# Patient Record
Sex: Female | Born: 1950 | Race: Black or African American | Hispanic: No | Marital: Single | Smoking: Current every day smoker
Health system: Southern US, Community
[De-identification: ages and names within clinical notes are randomized; demographics above are authoritative.]

## PROBLEM LIST (undated history)

## (undated) DIAGNOSIS — Z87898 Personal history of other specified conditions: Secondary | ICD-10-CM

## (undated) DIAGNOSIS — J449 Chronic obstructive pulmonary disease, unspecified: Secondary | ICD-10-CM

## (undated) DIAGNOSIS — R112 Nausea with vomiting, unspecified: Secondary | ICD-10-CM

## (undated) DIAGNOSIS — T8859XA Other complications of anesthesia, initial encounter: Secondary | ICD-10-CM

## (undated) DIAGNOSIS — F419 Anxiety disorder, unspecified: Secondary | ICD-10-CM

## (undated) DIAGNOSIS — Z9889 Other specified postprocedural states: Secondary | ICD-10-CM

## (undated) DIAGNOSIS — M1990 Unspecified osteoarthritis, unspecified site: Secondary | ICD-10-CM

## (undated) DIAGNOSIS — J9610 Chronic respiratory failure, unspecified whether with hypoxia or hypercapnia: Secondary | ICD-10-CM

## (undated) DIAGNOSIS — I2510 Atherosclerotic heart disease of native coronary artery without angina pectoris: Secondary | ICD-10-CM

## (undated) DIAGNOSIS — I639 Cerebral infarction, unspecified: Secondary | ICD-10-CM

## (undated) DIAGNOSIS — I10 Essential (primary) hypertension: Secondary | ICD-10-CM

## (undated) DIAGNOSIS — M797 Fibromyalgia: Secondary | ICD-10-CM

## (undated) DIAGNOSIS — E119 Type 2 diabetes mellitus without complications: Secondary | ICD-10-CM

## (undated) DIAGNOSIS — K219 Gastro-esophageal reflux disease without esophagitis: Secondary | ICD-10-CM

## (undated) DIAGNOSIS — F319 Bipolar disorder, unspecified: Secondary | ICD-10-CM

## (undated) DIAGNOSIS — F32A Depression, unspecified: Secondary | ICD-10-CM

## (undated) DIAGNOSIS — M353 Polymyalgia rheumatica: Secondary | ICD-10-CM

## (undated) HISTORY — DX: Gastro-esophageal reflux disease without esophagitis: K21.9

## (undated) HISTORY — DX: Bipolar disorder, unspecified: F31.9

## (undated) HISTORY — DX: Type 2 diabetes mellitus without complications: E11.9

## (undated) HISTORY — PX: SHOULDER SURGERY: SHX246

## (undated) HISTORY — DX: Polymyalgia rheumatica: M35.3

## (undated) HISTORY — DX: Chronic respiratory failure, unspecified whether with hypoxia or hypercapnia: J96.10

## (undated) HISTORY — PX: BACK SURGERY: SHX140

## (undated) HISTORY — PX: ABDOMINAL HYSTERECTOMY: SHX81

## (undated) HISTORY — DX: Essential (primary) hypertension: I10

## (undated) HISTORY — DX: Anxiety disorder, unspecified: F41.9

## (undated) HISTORY — DX: Atherosclerotic heart disease of native coronary artery without angina pectoris: I25.10

## (undated) HISTORY — DX: Chronic obstructive pulmonary disease, unspecified: J44.9

---

## 2003-01-26 ENCOUNTER — Ambulatory Visit (HOSPITAL_COMMUNITY): Admission: RE | Admit: 2003-01-26 | Discharge: 2003-01-26 | Payer: Self-pay | Admitting: Family Medicine

## 2003-01-26 ENCOUNTER — Encounter: Payer: Self-pay | Admitting: Family Medicine

## 2003-02-01 ENCOUNTER — Encounter: Admission: RE | Admit: 2003-02-01 | Discharge: 2003-03-04 | Payer: Self-pay | Admitting: Family Medicine

## 2003-03-04 ENCOUNTER — Emergency Department (HOSPITAL_COMMUNITY): Admission: EM | Admit: 2003-03-04 | Discharge: 2003-03-04 | Payer: Self-pay | Admitting: Emergency Medicine

## 2004-05-16 ENCOUNTER — Ambulatory Visit: Payer: Self-pay | Admitting: Family Medicine

## 2004-06-14 ENCOUNTER — Ambulatory Visit: Payer: Self-pay | Admitting: Family Medicine

## 2004-08-28 ENCOUNTER — Ambulatory Visit: Payer: Self-pay | Admitting: Cardiology

## 2004-09-10 ENCOUNTER — Ambulatory Visit: Payer: Self-pay | Admitting: Family Medicine

## 2004-09-19 ENCOUNTER — Ambulatory Visit: Payer: Self-pay | Admitting: Family Medicine

## 2004-10-10 ENCOUNTER — Ambulatory Visit: Payer: Self-pay | Admitting: Family Medicine

## 2004-11-15 ENCOUNTER — Inpatient Hospital Stay (HOSPITAL_COMMUNITY): Admission: RE | Admit: 2004-11-15 | Discharge: 2004-11-17 | Payer: Self-pay | Admitting: Specialist

## 2005-01-03 ENCOUNTER — Ambulatory Visit: Payer: Self-pay | Admitting: Family Medicine

## 2005-01-14 ENCOUNTER — Ambulatory Visit: Payer: Self-pay | Admitting: Family Medicine

## 2005-02-12 ENCOUNTER — Ambulatory Visit: Payer: Self-pay | Admitting: Family Medicine

## 2005-02-19 ENCOUNTER — Encounter: Admission: RE | Admit: 2005-02-19 | Discharge: 2005-03-26 | Payer: Self-pay | Admitting: Specialist

## 2005-04-17 ENCOUNTER — Ambulatory Visit: Payer: Self-pay | Admitting: Family Medicine

## 2005-05-21 ENCOUNTER — Ambulatory Visit: Payer: Self-pay | Admitting: Family Medicine

## 2005-05-28 ENCOUNTER — Encounter: Admission: RE | Admit: 2005-05-28 | Discharge: 2005-08-26 | Payer: Self-pay | Admitting: Family Medicine

## 2005-07-18 ENCOUNTER — Ambulatory Visit: Payer: Self-pay | Admitting: Family Medicine

## 2005-08-19 ENCOUNTER — Ambulatory Visit: Payer: Self-pay | Admitting: Family Medicine

## 2005-09-23 ENCOUNTER — Ambulatory Visit: Payer: Self-pay | Admitting: Family Medicine

## 2005-11-20 ENCOUNTER — Ambulatory Visit: Payer: Self-pay | Admitting: Family Medicine

## 2006-01-01 ENCOUNTER — Ambulatory Visit: Payer: Self-pay | Admitting: Family Medicine

## 2006-01-23 ENCOUNTER — Ambulatory Visit: Payer: Self-pay | Admitting: Family Medicine

## 2006-02-19 ENCOUNTER — Ambulatory Visit: Payer: Self-pay | Admitting: Family Medicine

## 2006-03-27 ENCOUNTER — Ambulatory Visit: Payer: Self-pay | Admitting: Family Medicine

## 2006-04-30 ENCOUNTER — Ambulatory Visit: Payer: Self-pay | Admitting: Family Medicine

## 2006-05-20 ENCOUNTER — Ambulatory Visit: Payer: Self-pay | Admitting: Physician Assistant

## 2006-06-18 ENCOUNTER — Ambulatory Visit: Payer: Self-pay | Admitting: Family Medicine

## 2006-07-11 ENCOUNTER — Ambulatory Visit: Payer: Self-pay | Admitting: Family Medicine

## 2006-08-11 ENCOUNTER — Ambulatory Visit: Payer: Self-pay | Admitting: Family Medicine

## 2006-09-02 ENCOUNTER — Ambulatory Visit: Payer: Self-pay | Admitting: Family Medicine

## 2006-10-13 ENCOUNTER — Ambulatory Visit: Payer: Self-pay | Admitting: Family Medicine

## 2010-07-15 HISTORY — PX: ANTERIOR FUSION CERVICAL SPINE: SUR626

## 2010-08-04 ENCOUNTER — Encounter: Payer: Self-pay | Admitting: Specialist

## 2010-08-05 ENCOUNTER — Encounter: Payer: Self-pay | Admitting: Specialist

## 2010-11-30 NOTE — Op Note (Signed)
NAMEJAYANA, KOTULA                 ACCOUNT NO.:  [id number]   MEDICAL RECORD NO.:  [id number]          PATIENT TYPE:  INP   LOCATION:  5007                         FACILITY:  [facility]   PHYSICIAN:  Kerrin Champagne, M.D.   DATE OF BIRTH:  [birth year]   DATE OF PROCEDURE:  11/16/2004  DATE OF DISCHARGE:                                 OPERATIVE REPORT   PREOPERATIVE DIAGNOSES:  Cervical spinal stenosis with herniated nucleus  pulposus at C4-5; foraminal entrapment bilateral C4-5, bilateral C5-6 and C6-  7.   POSTOPERATIVE DIAGNOSES:  Cervical spinal stenosis with herniated nucleus  pulposus at C4-5; foraminal entrapment bilateral C4-5, bilateral C5-6 and C6-  7.   PROCEDURE:  Anterior cervical diskectomy and fusion -- C4-5, C5-6 and C6-7;  with right iliac crest bone graft harvested through a separate incision.  Internal fixation using a 61 mm length DePuy locking plate with six screws.   SURGEON:  Kerrin Champagne, M.D.   ASSISTANT:  Maud Deed, [facility]   ANESTHESIA:  GOT.   ANESTHESIOLOGIST:  Maren Beach, M.D.   ESTIMATED BLOOD LOSS:  75 cc.   COMPLICATIONS:  None.   BRIEF CLINICAL HISTORY:  The patient is a 60 year old female who reportedly  injured her neck while working in [place]. This occurred while trying  to move a can full of clothing, weighing close to 100 pounds. She has had  pain and discomfort ever since; radiation and  burning pain in the left  fourth and fifth digits and into the left thumb. She underwent attempts at  conservative management, which were unsuccessful, including physical  therapy, the use of anti-inflammatory agents and medications for discomfort.  She, however, has not shown any significant improvement. She has significant  radiographic findings of narrowing of disk spaces at C5-6 and C6-7, some  spurring anteriorly at C4-5 level. MRI study demonstrates severe stenosis of  C5-6 and C6-7, foraminal entrapment bilaterally at C5-6 and C6-7,  there is a  HNP centrally at the C4-5 level. The patient has persisted with her pain,  discomfort and radiation into her shoulders.  On the basis of what is felt  to be cervical stenosis; EMGs, nerve conduction studies returned showing  left-sided C6 and C7 changes.   The patient is brought to the operating room to undergo anterior diskectomy  and fusion at C4-5, C5-6 and C6-7, for cervical stenosis and foraminal  entrapment causing left-sided C6 and C7 nerve root compression.   INTRAOPERATIVE FINDINGS:  The patient was found to have central stenosis at  every segment of C4-5, C5-6 and C6-7. She is found to have bilateral  foraminal entrapment at each segment. Some amount of disk material was found  to be present exiting into the left C6 and C7 neural foramen. Centrally some  small amount of disk material at the C4-5 level, but primarily spondylosis  and calcification of the posterior lip of C4-5 disk space.   DESCRIPTION OF PROCEDURE:  After adequate general anesthesia the patient  placed in a beach-chair position with the neck in slight extension, a well-  padded [place]  horseshoe at five pounds cervical halter traction. The  shoulders and arms at the sides were well-padded with skids in place. TED  hose for both lower extremities to prevent DVT. Foley catheter was placed in  the beginning of the procedure. A bump under the right buttock to allow for  easier exposure of the right anterior iliac crest. Standard preoperative  antibiotics of Ancef. A standard prep with DuraPrep solution over the  anterior neck and right iliac crest; draped in the usual manner with iodine  VyDrape was used. Initial incision was over a left cervical anterior  exposure, and the incision made along the anterior border of the  sternocleidomastoid approximately 6-7 cm in length; through the skin and  subcutaneous layers and central portion.  The incision based at the expected  C5 level. Then the skin and  subcutaneous layers down to the platysma layer;  this was incised and the medial border of the sternocleidomastoid  identified. Carefully the soft tissues developed both proximally and  distally, large external jugular vein was identified and t is was suture  ligated and divided in the proximal portion of the incision. The omohyoid  muscle identified and this was then carefully freed up and divided.   The interval between the trachea and esophagus medially and carotid sheath  laterally then developed, using Metzenbaum scissors down to the anterior  aspect of the cervical spine.   Here the inferior thyroid artery and vein were identified. These were  carefully freed up and then suture ligated with 2-0 Vicryl sutures; to allow  for mobilization of the trachea and esophagus. Prevertebral fascia was then  carefully cauterized with bipolar electrocautery along the medial border of  the longus colli muscle, then freed up across the midline using a Forensic scientist.  Large spurs at the expected C6-7 level were identified; those at  the C5-6 level and C4-5 identified. Spinal needles with sheath in place,  only allowing a centimeter of the needle to extend beyond the tip, were then  inserted into the disk space at C5-6 and C4-5. Intraoperative lateral  radiograph demonstrated the needles at the said levels.  These noted, then  carefully under handheld Cloward retraction, the needles were removed and a  small portion the anterior aspect of disk was excised using a 15 blade  scalpel and pituitary rongeurs at  the C4-5 and C5-6 levels.   Medial border of longus colli muscle then carefully freed up from C4 to C7  bilaterally, using electrocautery along with Key elevator. A Voss-McCullough  retractor inserted, articulated to allow for its placement in the neck.  The foot of the retractor beneath the medial border longus colli muscle placed  at the C6-7 level initially. Osteophytes of the anterior  aspect of the disk  resected using [facility] rongeur.   Then 3 mm-2 mm Kerrison used to additionally resect the anterior lip  osteophytes of the C6-7 level.  A 14 mm screw post placed in the vertebral  body of C6 and C7. Distraction obtained across the disk space. Further  debridement of this then carried out, removing degenerated disk as well as  cartilaginous endplates, using a Ephys 2-0 microcurette as well as pituitary  rongeurs back to the posterior aspect of the disk. High-speed bur was then  used to carefully remove bony areas over the anterior aspect of disk space,  and posteriorly over the posterior endplate it was noted to be concave.  This was resected back to allow [place] to remove  the posterior lip osteophyte  present. The operating room microscope draped and  brought into the field  sterilely, and under the microscope then a careful excision of the posterior  lip osteophytes was carried out.  We first used high-speed bur to thin down  the vertebral joints laterally, using 1 mm Kerrison to perform foraminotomy,  and then being able to negotiate the posterior aspect of the vertebral body,  posterior-inferior aspect of C6, and then resect the osteophyte cranially to  its largest projection. This was then removed off of the cord and the  posterior longitudinal ligament using a micro pituitary rongeur. This was  carried out over the inferior endplate of  C6-7 as well.  The disk material  removed and posterior annulus removed at C6-7 level. Where possible, the  posterior longitudinal ligament was left in place.   Foraminotomy performed over both the right C7 and left C7 nerve roots; such  that the nerve roots were observed to be exiting and in a more anterolateral  position from its exit off the cord at both levels --  indicating that it  had been well decompressed. Height of the intervertebral disk space measured  using a #8 sounder off the DePuy set; this measured 9 mm and a 9 mm dual   oscillating saw was used to form-cut into the right iliac crest.  The right  iliac crest was initially exposed while the radiograph, and  the lateral  cervical spine localization view was being done using a 10 blade scalpel  after infiltration of the skin with Marcaine 0.5% with 200,000 epinephrine.  Incision carried down sharply to the right anterior lateral iliac crest  approximately 2-1/2 inches from the anterosuperior iliac spine.  Incision  made through skin and subcutaneous layers directly down to the crest with  electrocautery to control bleeders; subperiosteal dissection of the medial  and lateral to protect the crest, with retractors on either side of the  crest.  Dual oscillating saw at 9 mm used to cut the crest, and the base of  the cut then completed using a 1/4 inch osteotome. This was then carefully tapered to the dimensions of intervertebral disk space; depth of the  intervertebral disk space measured using a Cloward depth gauge, and about 20  mm  a graft width of about 14 mm in depth of approximately 17 mm was used.  A height of 9 mm was carefully trimmed with the dimensions of the  intervertebral disk space, carefully keyed. High-speed bur was then used to  carefully remove further cartilaginous endplates down to bleeding endplate  bone. Graft then placed over the intervertebral disk space following  irrigation, and ensuring that there was no soft tissue remaining within the  disk space.  The graft was then impacted into place and subset 1-2 mm.  With  the graft in place, then a high-speed bur was then used to carefully trim  further the anterior lip osteophytes; carefully smooth this area. The screw  posts of the C7 level was then removed. Bone wax applied to bleeding screw  post hole for hemostasis. Self-retaining retractors were then removed from  the incision, then placed at the next segment at C5-6. Put the retractor  beneath the medial border of the  longus colli  muscle. Screw post of 14 mm  then inserted into the vertebral body of C5 and distraction obtained across  the C5-6 disk space. Anterior lip osteophytes removed using 2-3 mm  Kerrison's and pituitary, as well  as microcurets used to debride the disk of  the anterior annulus and degenerative disk material.  Cartilaginous  endplates were resected both from the inferior aspect of C5 and superior  aspect of C6, back to the posterior aspect of the disk space. The operating  room microscope brought into the field, and under the operating microscope  then the high-speed bur (3 mm) was used to carefully trim the cartilaginous  endplates back to bleeding bone endplates.  We carefully thinned the  uncovertebral regions bilaterally to an area where entry could be made with  1 mm and 2 mm  Kerrison's, performing foraminotomies over the C6 nerve roots  bilaterally and resecting posterior lip osteophytes cranial to the largest  projection -- both from the superior aspect of posterior disk space and then  over the inferior aspect. This decompressed the central portion of the  spinal canal quite nicely at both foramen. Height of the intervertebral disk  space measured using a 7 mm sounder, and a height of 8 mm was chosen.   Turning to the right iliac crest, an 8 mm dual oscillating saw was then used  to further cut crest. This was then carefully tapered to the dimensions of  intervertebral disk space depth at this level. An 18 mm with 15 mm graft  depth was chosen, with height of 8 mm.  This was carefully tapered to the  dimensions of the intervertebral disk space and keyed. Then impacted into  place, ensuring no soft tissue remained within the disk space could be  retropulsed with its insertion. Graft was keyed and subset an additional 1-2  mm for stability. Screw posts then removed at the C6 level. Bone wax applied to bleeding screw post hole. Carefully, the self-retaining retractor was  removed and  replaced at the C4-5 level. Distraction obtained with a Boss  retractor blade beneath the medial border of the longus colli muscle on both  sides -- exposing the C4-5 level.  Screw post was then inserted into the  vertebral body anteriorly of C4. Distraction obtained across the disk space  of the C4-5 level. Anterior lip osteophytes removed using a high-speed bur  as well as pituitary rongeurs, and 2 mm and 3 mm Kerrison's. Anterior  annular fibers resected with a 15 blade scalpel, as well as Kerrison's and  curets. Cartilaginous endplates resected off the inferior aspect of C4 and  superior aspect of C5 vertebral body; down to the bleeding bony endplates.  This was continued back to the posterior aspect of the disk space. A high-  speed bur was then used to carefully thin the uncovertebral joints  bilaterally. Foraminotomy performed over both neural foramen using 1 mm and  2 mm Kerrison's; then able to access the area superior to the posterior lip  osteophytes, and then resect superior to the most prominent area of the  protracting spurs posteriorly at the C4 level, and then inferior to that at  the C5 level. Spinal canal appeared to be well decompressed within it. There  is some bony material within the posterior longitudinal ligament that was  resected as well centrally. Both the C5 nerve roots noted be exiting quite  well. Note that there was some disk material resected from the left C6-7 and  C5-6 level foramen on the left side, highly suspicious for disk protrusion  of the segments. With resection of this, then a high-speed bur was then used  to carefully perform decortication of the endplates at C4-C5 to bleeding  endplates. The height of the intervertebral disk space measured with a 7 mm  sounder as well; 8 mm dual oscillating saw used cut the crest on the right  side. This was divided across the space again with a 1/4 inch osteotome,  carefully tapering to dimensions of the  intervertebral disk space depth at  this level (again, 18 mm)  A 15 mm depth graft was chosen. The graft was  carefully keyed and impacted into place, after careful inspection of the  disk space demonstrated no soft tissue to be retropulsed with insertion of  the graft. With this then, both screw posts were removed. The graft was  carefully keyed into place; 1 mm  to 2 mm subset. With both screw posts  removed, screw post holes carefully coated with bone wax to obtain  hemostasis.   Anterior aspect of the cervical spine was carefully examined.  High-speed  bur used to trim anterior lip osteophytes of C4-5, C5-6 and C6-7; to  carefully smooth the anterior surface of cervical spine to accept the DePuy  locking plate. Several plates were placed against the anterior cervical  spine; a 61-mm plate provided the best length, as the screw holes proximally or cranially appeared to be just above the disk space at C4-5, with those  inferiorly similarly just below the C6-7 disk space. This would prevent  pressure compression of the adjacent segment and motion segment. This plate  was then carefully aligned at the midline, and then temporarily pinned in  place after release of traction. Five pounds of cervical halter traction was  released, and the pins were then placed to the vertebral body of C4 and at  C7, fixing the plate in place temporarily. The first screws placed were at  the C7 level (15 mm screws) and the 16 mm drill was used, then 16 mm  self-  tapping screws placed both left and right side without difficulty.   Attention then turned to the C6 level, where the distraction pins were then  removed both superiorly and inferiorly.  These were temporary fixation pins.  This will allow for each of the levels to compress down with the internal  fixation.  At the C6 level, A 15 mm screw was used and this was used on both  sides, first drilling with a 16 mm drill and placing the 15 mm screws at  C6.  At C5,  15 mm screws were placed as well as C4. These provided excellent  fixation at each segment and good capture of  bone stalk. The plate appeared  to kneel to the anterior cervical spine quite nicely. Each of the locking  screws were then turned, locking the plate to the screws at each segment  without difficulty. Intraoperative radiograph did not demonstrate the lower  screws, so the C-arm fluoroscopy  was brought into the field.  Under C-arm  fluoroscopy,  AP view demonstrated the plate in excellent position and  alignment in AP plane, as well as in lateral planes.  Screws showing no  evidence of retropulsion.  The bone graft also showing no sign of  retropulsion. Soft tissue structures appeared normal. Irrigation was  performed of the soft tissues.  The C-arm was removed. Following irrigation,  a 10-French TLS  drain was placed in the cervical spine exiting anteriorly.  The prevertebral fascial layers were allowed to fall softly into place, as  was the esophagus and trachea.  Careful inspection of the esophagus  demonstrated no abnormalities. The  patient had reapproximation of platysma  layer with interrupted 2-0 Vicryl sutures; deep subcutaneous layers with  interrupted 2-0 and 3-0 Vicryl sutures. The subcuticular stitch was then  placed using 4-0 running stitch to close the skin. Tincture of benzoin and  Steri-Strips applied. The drain was sewn in place with 4-0 nylon stitch and  then charged to a red top tube. Right iliac crest bone graft harvest site  was carefully hemostased using bone wax and thrombin-soaked Gelfoam.  Periosteum carefully approximated one to the other with #1 Vicryl sutures.  The deep subcutaneous  layers approximated with interrupted #1 and 0 Vicryl  sutures. The skin closed following, closing the more superficial layers with  interrupted 2-0 Vicryl sutures. Skin was closed with 4-0 Vicryl running subcuticular stitch. Tincture of benzoin and Steri-Strips  applied; 4x4s  affixed to the skin with Hypafix tape.  The left neck 4x4s affixed to the  skin with Hypafix tape.  [place] collar was applied. The patient was  then returned to her bed; reactivated, extubated and returned to the  recovery room in satisfactory condition. All instrument and sponge counts  were correct.      JEN/MEDQ  D:  11/16/2004  T:  11/16/2004  Job:  [id number]

## 2010-11-30 NOTE — Procedures (Signed)
   NAME:  Candace Harris, Candace Harris                           ACCOUNT NO.:  [id number]   MEDICAL RECORD NO.:  [id number]                   PATIENT TYPE:  OUT   LOCATION:  RAD                                  FACILITY:  [facility]   PHYSICIAN:  Thomas C. Wall, M.D.                DATE OF BIRTH:  26-Sep-1950   DATE OF PROCEDURE:  DATE OF DISCHARGE:                                  ECHOCARDIOGRAM   INDICATIONS FOR PROCEDURE:  Stroke (434.91).   The echocardiogram was technically adequate.   CONCLUSION:  1. Normal left and right ventricular size.  2. Normal left ventricular chamber size and overall systolic function.     There is no segmental wall motion abnormalities and no obvious mass or     thrombus.  3. Mild mitral regurgitation.  4. Normal aortic valve.  5. Normal right-sided structures and function.   There is no obvious cardiogenic source of embolus noted on this study.                                                Thomas C. Wall, M.D.    TCW/MEDQ  D:  01/26/2003  T:  01/27/2003  Job:  [id number]   cc:   Delaney Meigs, M.D.  [address]  [place]  [place] [address]  Fax: [phone]

## 2015-01-06 DIAGNOSIS — F411 Generalized anxiety disorder: Secondary | ICD-10-CM | POA: Insufficient documentation

## 2015-01-06 DIAGNOSIS — M545 Low back pain, unspecified: Secondary | ICD-10-CM | POA: Insufficient documentation

## 2015-01-06 DIAGNOSIS — F324 Major depressive disorder, single episode, in partial remission: Secondary | ICD-10-CM | POA: Insufficient documentation

## 2015-11-06 DIAGNOSIS — F4310 Post-traumatic stress disorder, unspecified: Secondary | ICD-10-CM | POA: Insufficient documentation

## 2016-03-08 DIAGNOSIS — E538 Deficiency of other specified B group vitamins: Secondary | ICD-10-CM | POA: Insufficient documentation

## 2016-03-26 DIAGNOSIS — D510 Vitamin B12 deficiency anemia due to intrinsic factor deficiency: Secondary | ICD-10-CM | POA: Insufficient documentation

## 2016-04-01 DIAGNOSIS — M1991 Primary osteoarthritis, unspecified site: Secondary | ICD-10-CM | POA: Insufficient documentation

## 2017-02-13 DIAGNOSIS — D509 Iron deficiency anemia, unspecified: Secondary | ICD-10-CM | POA: Insufficient documentation

## 2017-03-20 DIAGNOSIS — M1990 Unspecified osteoarthritis, unspecified site: Secondary | ICD-10-CM | POA: Insufficient documentation

## 2017-06-03 DIAGNOSIS — F17210 Nicotine dependence, cigarettes, uncomplicated: Secondary | ICD-10-CM | POA: Insufficient documentation

## 2017-06-03 DIAGNOSIS — F1190 Opioid use, unspecified, uncomplicated: Secondary | ICD-10-CM | POA: Insufficient documentation

## 2018-05-24 DIAGNOSIS — M797 Fibromyalgia: Secondary | ICD-10-CM | POA: Insufficient documentation

## 2018-08-13 ENCOUNTER — Other Ambulatory Visit (HOSPITAL_COMMUNITY): Payer: Self-pay | Admitting: Respiratory Therapy

## 2018-08-13 DIAGNOSIS — J441 Chronic obstructive pulmonary disease with (acute) exacerbation: Secondary | ICD-10-CM

## 2018-09-09 ENCOUNTER — Inpatient Hospital Stay (HOSPITAL_COMMUNITY): Admission: RE | Admit: 2018-09-09 | Payer: Self-pay | Source: Ambulatory Visit

## 2019-02-17 ENCOUNTER — Inpatient Hospital Stay (HOSPITAL_COMMUNITY): Admission: RE | Admit: 2019-02-17 | Payer: Medicare HMO | Source: Ambulatory Visit

## 2019-02-22 ENCOUNTER — Other Ambulatory Visit: Payer: Self-pay

## 2019-02-22 ENCOUNTER — Other Ambulatory Visit (HOSPITAL_COMMUNITY)
Admission: RE | Admit: 2019-02-22 | Discharge: 2019-02-22 | Disposition: A | Discharge status: Home / Self Care | Payer: Medicare HMO | Source: Ambulatory Visit | Attending: Pulmonary Disease | Admitting: Pulmonary Disease

## 2019-02-24 ENCOUNTER — Ambulatory Visit (HOSPITAL_COMMUNITY): Admission: RE | Admit: 2019-02-24 | Payer: Medicare HMO | Source: Ambulatory Visit

## 2019-03-09 ENCOUNTER — Other Ambulatory Visit (HOSPITAL_COMMUNITY): Admission: RE | Admit: 2019-03-09 | Payer: Medicare HMO | Source: Ambulatory Visit

## 2019-03-09 ENCOUNTER — Other Ambulatory Visit (HOSPITAL_COMMUNITY)
Admission: RE | Admit: 2019-03-09 | Discharge: 2019-03-09 | Disposition: A | Discharge status: Home / Self Care | Payer: Medicare HMO | Source: Ambulatory Visit | Attending: Pulmonary Disease | Admitting: Pulmonary Disease

## 2019-03-09 ENCOUNTER — Other Ambulatory Visit: Payer: Self-pay

## 2019-03-09 DIAGNOSIS — Z20828 Contact with and (suspected) exposure to other viral communicable diseases: Secondary | ICD-10-CM | POA: Diagnosis not present

## 2019-03-09 DIAGNOSIS — Z01812 Encounter for preprocedural laboratory examination: Secondary | ICD-10-CM | POA: Insufficient documentation

## 2019-03-09 LAB — SARS CORONAVIRUS 2 (TAT 6-24 HRS): SARS Coronavirus 2: NEGATIVE

## 2019-03-10 ENCOUNTER — Other Ambulatory Visit: Payer: Self-pay

## 2019-03-10 ENCOUNTER — Ambulatory Visit (HOSPITAL_COMMUNITY)
Admission: RE | Admit: 2019-03-10 | Discharge: 2019-03-10 | Disposition: A | Discharge status: Home / Self Care | Payer: Medicare HMO | Source: Ambulatory Visit | Attending: Pulmonary Disease | Admitting: Pulmonary Disease

## 2019-03-10 DIAGNOSIS — J441 Chronic obstructive pulmonary disease with (acute) exacerbation: Secondary | ICD-10-CM | POA: Insufficient documentation

## 2019-03-10 LAB — PULMONARY FUNCTION TEST
DL/VA % pred: 36 %
DL/VA: 1.55 ml/min/mmHg/L
DLCO unc % pred: 31 %
DLCO unc: 5.5 ml/min/mmHg
FEF 25-75 Post: 0.7 L/sec
FEF 25-75 Pre: 0.77 L/sec
FEF2575-%Change-Post: -8 %
FEF2575-%Pred-Post: 43 %
FEF2575-%Pred-Pre: 48 %
FEV1-%Change-Post: -2 %
FEV1-%Pred-Post: 83 %
FEV1-%Pred-Pre: 85 %
FEV1-Post: 1.39 L
FEV1-Pre: 1.42 L
FEV1FVC-%Change-Post: 3 %
FEV1FVC-%Pred-Pre: 86 %
FEV6-%Change-Post: -5 %
FEV6-%Pred-Post: 96 %
FEV6-%Pred-Pre: 102 %
FEV6-Post: 1.98 L
FEV6-Pre: 2.1 L
FEV6FVC-%Change-Post: 0 %
FEV6FVC-%Pred-Post: 103 %
FEV6FVC-%Pred-Pre: 104 %
FVC-%Change-Post: -5 %
FVC-%Pred-Post: 93 %
FVC-%Pred-Pre: 98 %
FVC-Post: 1.99 L
FVC-Pre: 2.11 L
Post FEV1/FVC ratio: 70 %
Post FEV6/FVC ratio: 100 %
Pre FEV1/FVC ratio: 67 %
Pre FEV6/FVC Ratio: 100 %

## 2019-03-10 MED ORDER — ALBUTEROL SULFATE (2.5 MG/3ML) 0.083% IN NEBU
2.5000 mg | INHALATION_SOLUTION | Freq: Once | RESPIRATORY_TRACT | Status: AC
  Administered 2019-03-10: 2.5 mg via RESPIRATORY_TRACT

## 2019-09-14 DIAGNOSIS — J449 Chronic obstructive pulmonary disease, unspecified: Secondary | ICD-10-CM | POA: Diagnosis not present

## 2019-09-14 DIAGNOSIS — M5136 Other intervertebral disc degeneration, lumbar region: Secondary | ICD-10-CM | POA: Diagnosis not present

## 2019-09-14 DIAGNOSIS — G894 Chronic pain syndrome: Secondary | ICD-10-CM | POA: Diagnosis not present

## 2019-09-14 DIAGNOSIS — Z79899 Other long term (current) drug therapy: Secondary | ICD-10-CM | POA: Diagnosis not present

## 2019-09-14 DIAGNOSIS — E559 Vitamin D deficiency, unspecified: Secondary | ICD-10-CM | POA: Diagnosis not present

## 2019-09-18 DIAGNOSIS — J449 Chronic obstructive pulmonary disease, unspecified: Secondary | ICD-10-CM | POA: Diagnosis not present

## 2019-09-19 DIAGNOSIS — J449 Chronic obstructive pulmonary disease, unspecified: Secondary | ICD-10-CM | POA: Diagnosis not present

## 2019-10-08 ENCOUNTER — Encounter: Payer: Self-pay | Admitting: Critical Care Medicine

## 2019-10-19 DIAGNOSIS — J449 Chronic obstructive pulmonary disease, unspecified: Secondary | ICD-10-CM | POA: Diagnosis not present

## 2019-10-20 DIAGNOSIS — H25812 Combined forms of age-related cataract, left eye: Secondary | ICD-10-CM | POA: Diagnosis not present

## 2019-10-20 DIAGNOSIS — H2511 Age-related nuclear cataract, right eye: Secondary | ICD-10-CM | POA: Diagnosis not present

## 2019-10-20 DIAGNOSIS — J449 Chronic obstructive pulmonary disease, unspecified: Secondary | ICD-10-CM | POA: Diagnosis not present

## 2019-10-25 DIAGNOSIS — M353 Polymyalgia rheumatica: Secondary | ICD-10-CM | POA: Diagnosis not present

## 2019-10-25 DIAGNOSIS — Z79899 Other long term (current) drug therapy: Secondary | ICD-10-CM | POA: Diagnosis not present

## 2019-10-25 DIAGNOSIS — G894 Chronic pain syndrome: Secondary | ICD-10-CM | POA: Diagnosis not present

## 2019-10-25 DIAGNOSIS — F1120 Opioid dependence, uncomplicated: Secondary | ICD-10-CM | POA: Diagnosis not present

## 2019-11-01 ENCOUNTER — Institutional Professional Consult (permissible substitution): Payer: Medicare HMO | Admitting: Critical Care Medicine

## 2019-11-01 ENCOUNTER — Encounter: Payer: Self-pay | Admitting: Critical Care Medicine

## 2019-11-01 NOTE — Progress Notes (Deleted)
Synopsis: Referred in April 2021 for COPD by Veverly Fells, MD  Subjective:   PATIENT ID: Candace Harris GENDER: female DOB: September 17, 1950, MRN: [id number]  No chief complaint on file.   HPI  Former patient of Dr. Juanetta Gosling  COPD-Asmanex, Symbicort Tobacco-  ***ppd x***years Chronic hypoxic respiratory failure on home oxygen  Chronic prednisone for polymyalgia rheumatica  PCP note 02/22/2019 reviewed  Past Medical History:  Diagnosis Date  . Anxiety   . Bipolar affective disorder (HCC)   . CAD (coronary artery disease)   . Chronic respiratory failure (HCC)   . COPD (chronic obstructive pulmonary disease) (HCC)   . DM (diabetes mellitus) (HCC)   . GERD (gastroesophageal reflux disease)   . HTN (hypertension)   . Polymyalgia rheumatica (HCC)      No family history on file.   *** The histories are not reviewed yet. Please review them in the "History" navigator section and refresh this SmartLink.  Social History   Socioeconomic History  . Marital status: Single    Spouse name: Not on file  . Number of children: Not on file  . Years of education: Not on file  . Highest education level: Not on file  Occupational History  . Not on file  Tobacco Use  . Smoking status: Not on file  Substance and Sexual Activity  . Alcohol use: Not on file  . Drug use: Not on file  . Sexual activity: Not on file  Other Topics Concern  . Not on file  Social History Narrative  . Not on file   Social Determinants of Health   Financial Resource Strain:   . Difficulty of Paying Living Expenses:   Food Insecurity:   . Worried About Programme researcher, broadcasting/film/video in the Last Year:   . Barista in the Last Year:   Transportation Needs:   . Freight forwarder (Medical):   Marland Kitchen Lack of Transportation (Non-Medical):   Physical Activity:   . Days of Exercise per Week:   . Minutes of Exercise per Session:   Stress:   . Feeling of Stress :   Social Connections:   . Frequency of  Communication with Friends and Family:   . Frequency of Social Gatherings with Friends and Family:   . Attends Religious Services:   . Active Member of Clubs or Organizations:   . Attends Banker Meetings:   Marland Kitchen Marital Status:   Intimate Partner Violence:   . Fear of Current or Ex-Partner:   . Emotionally Abused:   Marland Kitchen Physically Abused:   . Sexually Abused:      Not on File    There is no immunization history on file for this patient.  No outpatient medications prior to visit.   No facility-administered medications prior to visit.    ROS   Objective:  There were no vitals filed for this visit.   on *** LPM *** RA BMI Readings from Last 3 Encounters:  No data found for BMI   Wt Readings from Last 3 Encounters:  No data found for Wt    Physical Exam   CBC No results found for: WBC, RBC, HGB, HCT, PLT, MCV, MCH, MCHC, RDW, LYMPHSABS, MONOABS, EOSABS, BASOSABS  CHEMISTRY No results for input(s): NA, K, CL, CO2, GLUCOSE, BUN, CREATININE, CALCIUM, MG, PHOS in the last 168 hours. CrCl cannot be calculated (No successful lab value found.).   Chest Imaging- films reviewed: CXR, 2 view 11/16/2004-hyperinflation.  Mild airway thickening.  Pulmonary Functions Testing Results: PFT Results Latest Ref Rng & Units 03/10/2019  FVC-Pre L 2.11  FVC-Predicted Pre % 98  FVC-Post L 1.99  FVC-Predicted Post % 93  Pre FEV1/FVC % % 67  Post FEV1/FCV % % 70  FEV1-Pre L 1.42  FEV1-Predicted Pre % 85  FEV1-Post L 1.39  DLCO UNC% % 31  DLCO COR %Predicted % 36   2020- mild obstruction without significant bronchodilator reversibility. Severe diffusion impairment.  Flow volume loop supports obstruction.      Assessment & Plan:     ICD-10-CM   1. Chronic obstructive pulmonary disease, unspecified COPD type ([place])  J44.9      No current outpatient medications on file.    Julian Hy, DO [facility] 11/01/2019 8:15 AM

## 2019-11-11 DIAGNOSIS — R112 Nausea with vomiting, unspecified: Secondary | ICD-10-CM | POA: Diagnosis not present

## 2019-11-11 DIAGNOSIS — F419 Anxiety disorder, unspecified: Secondary | ICD-10-CM | POA: Diagnosis not present

## 2019-11-18 DIAGNOSIS — J449 Chronic obstructive pulmonary disease, unspecified: Secondary | ICD-10-CM | POA: Diagnosis not present

## 2019-11-19 DIAGNOSIS — J449 Chronic obstructive pulmonary disease, unspecified: Secondary | ICD-10-CM | POA: Diagnosis not present

## 2019-11-23 DIAGNOSIS — H524 Presbyopia: Secondary | ICD-10-CM | POA: Diagnosis not present

## 2019-11-23 DIAGNOSIS — H2522 Age-related cataract, morgagnian type, left eye: Secondary | ICD-10-CM | POA: Diagnosis not present

## 2019-11-24 ENCOUNTER — Ambulatory Visit (INDEPENDENT_AMBULATORY_CARE_PROVIDER_SITE_OTHER): Payer: Medicare HMO | Admitting: Critical Care Medicine

## 2019-11-24 ENCOUNTER — Other Ambulatory Visit: Payer: Self-pay

## 2019-11-24 ENCOUNTER — Encounter: Payer: Self-pay | Admitting: Critical Care Medicine

## 2019-11-24 VITALS — BP 130/70 | HR 104 | Temp 97.0°F | Ht 61.0 in | Wt 134.2 lb

## 2019-11-24 DIAGNOSIS — J9611 Chronic respiratory failure with hypoxia: Secondary | ICD-10-CM

## 2019-11-24 DIAGNOSIS — J439 Emphysema, unspecified: Secondary | ICD-10-CM | POA: Diagnosis not present

## 2019-11-24 DIAGNOSIS — Z720 Tobacco use: Secondary | ICD-10-CM

## 2019-11-24 NOTE — Patient Instructions (Addendum)
Thank you for visiting Dr. Chestine Spore at [facility]. We recommend the following: Orders Placed This Encounter  Procedures  . ECHOCARDIOGRAM COMPLETE   Orders Placed This Encounter  Procedures  . ECHOCARDIOGRAM COMPLETE    Standing Status:   Future    Standing Expiration Date:   02/23/2021    Order Specific Question:   Where should this test be performed    Answer:   [facility]    Order Specific Question:   Perflutren DEFINITY (image enhancing agent) should be administered unless hypersensitivity or allergy exist    Answer:   Administer Perflutren    Order Specific Question:   Reason for exam-Echo    Answer:   Other-Full Diagnosis List    Order Specific Question:   Full ICD-10/Reason for Exam    Answer:   Lightheadedness [540086]    Keep taking Trelegy once daily.  Cut down to 5 cigarettes per day.   Return in about 2 months (around 01/24/2020).    Please do your part to reduce the spread of COVID-19.  It is very important that you stop smoking or vaping. This is the single most important thing that you can do to improve your lung health.   S = Set a quit date. T = Tell family, friends, and the people around you that you plan to quit. A = Anticipate or plan ahead for the tough times you'll face while quitting. R = Remove cigarettes and other tobacco products from your home, car, and work. T = Talk to [place] about getting help to quit.  If you need help, please reach out to our office or the smoking cessation resources available: [facility] Smoking Cessation Class: [phone] 1-800-QUIT-NOW www.BeTobaccoFree.gov

## 2019-11-24 NOTE — Progress Notes (Signed)
Synopsis: Referred in May 2021 for COPD by Candace Fells, MD  Subjective:   PATIENT ID: Candace Harris GENDER: female DOB: 07/24/1950, MRN: [id number]  Chief Complaint  Patient presents with  . Consult    SOB when not on O2, using 3L cont O2 at all times, productive cough with white/yellow sputum, whezzing     Candace Harris is a 69 year old woman with a history of tobacco abuse and COPD who presents for evaluation of dyspnea on exertion.  She is accompanied today by her son Candace Harris.  She is previously a patient of Dr. Juanetta Gosling.  She was diagnosed with COPD about 2 years ago but has had progressively worsening shortness of breath over the last 2 years.  She also has cough, sputum production, wheezing, but dyspnea on exertion is her main complaint.  Her activity is fairly limited; she is only able to walk a few steps around her house without stopping.  She reports that 2 years ago she was still able to walk around outside and was able to go fishing with her son.  She is on 3 L supplemental oxygen at home.  She was prescribed 2.5 L but had ongoing dyspnea on titrated up to 3 L.  Her home saturations are usually in the 90s, but sometimes drop into the 80s.  She is currently using her Trelegy inhaler daily with frequent albuterol.  She continues to smoke 0.25- 0.5 packs/day; she has smoked 0.5 ppd for the last 50 years.  She is not currently planning on quitting.  Has been unsuccessful trying nicotine replacement therapy and Chantix in the past.  With Chantix she had nausea, anger, was withdrawn.  She has a sister who has COPD.  She is on chronic steroids for polymyalgia rheumatica-referral to rheumatology pending.  She is on opiates for chronic pain all over.  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     Past Medical History:  Diagnosis Date  . Anxiety   . Bipolar affective disorder (HCC)   . CAD (coronary artery disease)   . Chronic respiratory failure (HCC)   . COPD (chronic  obstructive pulmonary disease) (HCC)   . DM (diabetes mellitus) (HCC)   . GERD (gastroesophageal reflux disease)   . HTN (hypertension)   . Polymyalgia rheumatica (HCC)      Family History  Problem Relation Age of Onset  . Hypertension Mother      Past Surgical History:  Procedure Laterality Date  . ANTERIOR FUSION CERVICAL SPINE  2012   C5-7    Social History   Socioeconomic History  . Marital status: Single    Spouse name: Not on file  . Number of children: Not on file  . Years of education: Not on file  . Highest education level: Not on file  Occupational History  . Not on file  Tobacco Use  . Smoking status: Current Every Day Smoker    Packs/day: 0.50    Years: 50.00    Pack years: 25.00    Types: Cigarettes  . Smokeless tobacco: Never Used  . Tobacco comment: 5-10 cigarettes per day/// 11/24/19 ARJ  Substance and Sexual Activity  . Alcohol use: Not on file  . Drug use: Not on file  . Sexual activity: Not on file  Other Topics Concern  . Not on file  Social History Narrative  . Not on file   Social Determinants of Health   Financial Resource Strain:   . Difficulty of  Paying Living Expenses:   Food Insecurity:   . Worried About Programme researcher, broadcasting/film/video in the Last Year:   . Barista in the Last Year:   Transportation Needs:   . Freight forwarder (Medical):   Marland Kitchen Lack of Transportation (Non-Medical):   Physical Activity:   . Days of Exercise per Week:   . Minutes of Exercise per Session:   Stress:   . Feeling of Stress :   Social Connections:   . Frequency of Communication with Friends and Family:   . Frequency of Social Gatherings with Friends and Family:   . Attends Religious Services:   . Active Member of Clubs or Organizations:   . Attends Banker Meetings:   Marland Kitchen Marital Status:   Intimate Partner Violence:   . Fear of Current or Ex-Partner:   . Emotionally Abused:   Marland Kitchen Physically Abused:   . Sexually Abused:      Allergies   Allergen Reactions  . Alendronate Other (See Comments)    Sore muscles, burning in chest Sore muscles, burning in chest   . Ibuprofen Nausea And Vomiting    Other reaction(s): Unknown   . Ramipril Nausea And Vomiting and Other (See Comments)  . Sulfabenzamide Other (See Comments)    HEADACHE      Immunization History  Administered Date(s) Administered  . Pneumococcal Polysaccharide-23 06/03/2017    Outpatient Medications Prior to Visit  Medication Sig Dispense Refill  . albuterol (VENTOLIN HFA) 108 (90 Base) MCG/ACT inhaler INHALE 2 PUFFS EVERY 4 HOURS AS NEEDED FOR WHEEZING    . amLODipine (NORVASC) 2.5 MG tablet TAKE 1 TABLET EVERY DAY    . ARIPiprazole (ABILIFY) 2 MG tablet TAKE 1 TABLET EVERY DAY    . celecoxib (CELEBREX) 200 MG capsule Take 200 mg by mouth daily.    Marland Kitchen HYDROcodone-acetaminophen (NORCO) 10-325 MG tablet Take 1 tablet by mouth 4 (four) times daily as needed.    . metFORMIN (GLUCOPHAGE) 500 MG tablet TAKE 1 TABLET TWICE DAILY WITH MEALS    . omeprazole (PRILOSEC) 40 MG capsule Take 40 mg by mouth every morning.    . ondansetron (ZOFRAN) 4 MG tablet Take 4 mg by mouth daily.    . sertraline (ZOLOFT) 25 MG tablet Take 25 mg by mouth daily.    . sertraline (ZOLOFT) 50 MG tablet Take 50 mg by mouth daily.    . TRELEGY ELLIPTA 100-62.5-25 MCG/INH AEPB Inhale 1 puff into the lungs daily.    Marland Kitchen XTAMPZA ER 9 MG C12A Take 1 capsule by mouth 2 (two) times daily.     No facility-administered medications prior to visit.    Review of Systems  Constitutional: Negative for weight loss.       Poor appetite  HENT: Negative for congestion and sore throat.   Respiratory: Positive for cough, sputum production, shortness of breath and wheezing.   Cardiovascular: Negative for leg swelling.       Episodes of sharp CP and pressure when sitting still  Gastrointestinal: Positive for nausea. Negative for diarrhea and vomiting.  Musculoskeletal: Positive for myalgias.   Neurological:       Dizziness with activity     Objective:   Vitals:   11/24/19 1158  BP: 130/70  Pulse: (!) 104  Temp: (!) 97 F (36.1 C)  TempSrc: Temporal  SpO2: 97%  Weight: 134 lb 3.2 oz (60.9 kg)  Height: 5\' 1"  (1.549 m)   97% on 3 LPM  BMI Readings from Last 3 Encounters:  11/24/19 25.36 kg/m   Wt Readings from Last 3 Encounters:  11/24/19 134 lb 3.2 oz (60.9 kg)    Physical Exam Vitals reviewed.  Constitutional:      Comments: Frail-appearing elderly woman in no acute distress, chronically ill-appearing  HENT:     Head: Normocephalic and atraumatic.  Eyes:     General: No scleral icterus. Cardiovascular:     Rate and Rhythm: Normal rate and regular rhythm.  Pulmonary:     Comments: Breathing comfortably on 3 L nasal cannula, very reduced breath sounds bilaterally.  No wheezing, rhonchi, or rales. Abdominal:     General: There is no distension.     Palpations: Abdomen is soft.     Tenderness: There is no abdominal tenderness.  Musculoskeletal:     Cervical back: Neck supple.  Skin:    General: Skin is warm and dry.     Findings: No rash.  Neurological:     General: No focal deficit present.     Coordination: Coordination normal.  Psychiatric:        Mood and Affect: Mood normal.        Behavior: Behavior normal.       CBC No results found for: WBC, RBC, HGB, HCT, PLT, MCV, MCH, MCHC, RDW, LYMPHSABS, MONOABS, EOSABS, BASOSABS  CHEMISTRY No results for input(s): NA, K, CL, CO2, GLUCOSE, BUN, CREATININE, CALCIUM, MG, PHOS in the last 168 hours. CrCl cannot be calculated (No successful lab value found.).   Chest Imaging- films reviewed: CXR, 2 view 11/16/2004-hyperinflation with flattened hemidiaphragms and increased retrosternal airspace.  Mild airway thickening.  Pulmonary Functions Testing Results: PFT Results Latest Ref Rng & Units 03/10/2019  FVC-Pre L 2.11  FVC-Predicted Pre % 98  FVC-Post L 1.99  FVC-Predicted Post % 93  Pre  FEV1/FVC % % 67  Post FEV1/FCV % % 70  FEV1-Pre L 1.42  FEV1-Predicted Pre % 85  FEV1-Post L 1.39  DLCO UNC% % 31  DLCO COR %Predicted % 36   2020-mild obstruction without significant bronchodilator reversibility. Severely impaired diffusion.       Assessment & Plan:     ICD-10-CM   1. Pulmonary emphysema, unspecified emphysema type ([place])  J43.9 ECHOCARDIOGRAM COMPLETE    Ambulatory Referral for DME  2. Chronic respiratory failure with hypoxia (HCC)  J96.11 ECHOCARDIOGRAM COMPLETE  3. Tobacco abuse  Z72.0 ECHOCARDIOGRAM COMPLETE    COPD with chronic hypoxic respiratory failure ongoing tobacco abuse -Walked in the office today.  Desaturated requiring 6 L of oxygen with ambulation.  Continue 3 L at rest and increased to 6 L with activity and sleep. -Continue Trelegy once daily -Continue albuterol every 4 hours as needed -Strongly recommend smoking cessation -Up-to-date on pneumococcal vaccines.  Recommend Covid and annual flu vaccines.  Discussed the importance of preventing exacerbations to prevent disease progression.  Severe dyspnea on exertion proportion to PFT severity -Echocardiogram to evaluate for possible pulmonary hypertension  Tobacco abuse -Recommended cessation discussed the importance of quitting smoking to control her disease.  I recommend that she decrease her smoking to 5 cigarettes/day and use nicotine replacement therapy and behavioral modifications to help avoid smoking more frequently.  Her family gets her cigarettes for her. -Discussed the possibility of lung cancer screening, although I am concerned with her level of deconditioning and severe respiratory failure that she would not be a candidate for any type of treatment if lung cancer were found.  They will think about it.  RTC in 2 months.    Current Outpatient Medications:  .  albuterol (VENTOLIN HFA) 108 (90 Base) MCG/ACT inhaler, INHALE 2 PUFFS EVERY 4 HOURS AS NEEDED FOR WHEEZING, Disp: , Rfl:   .  amLODipine (NORVASC) 2.5 MG tablet, TAKE 1 TABLET EVERY DAY, Disp: , Rfl:  .  ARIPiprazole (ABILIFY) 2 MG tablet, TAKE 1 TABLET EVERY DAY, Disp: , Rfl:  .  celecoxib (CELEBREX) 200 MG capsule, Take 200 mg by mouth daily., Disp: , Rfl:  .  HYDROcodone-acetaminophen (NORCO) 10-325 MG tablet, Take 1 tablet by mouth 4 (four) times daily as needed., Disp: , Rfl:  .  metFORMIN (GLUCOPHAGE) 500 MG tablet, TAKE 1 TABLET TWICE DAILY WITH MEALS, Disp: , Rfl:  .  omeprazole (PRILOSEC) 40 MG capsule, Take 40 mg by mouth every morning., Disp: , Rfl:  .  ondansetron (ZOFRAN) 4 MG tablet, Take 4 mg by mouth daily., Disp: , Rfl:  .  sertraline (ZOLOFT) 25 MG tablet, Take 25 mg by mouth daily., Disp: , Rfl:  .  sertraline (ZOLOFT) 50 MG tablet, Take 50 mg by mouth daily., Disp: , Rfl:  .  TRELEGY ELLIPTA 100-62.5-25 MCG/INH AEPB, Inhale 1 puff into the lungs daily., Disp: , Rfl:  .  XTAMPZA ER 9 MG C12A, Take 1 capsule by mouth 2 (two) times daily., Disp: , Rfl:     Steffanie Dunn, DO [facility] 11/24/2019 3:44 PM

## 2019-11-29 DIAGNOSIS — Z79899 Other long term (current) drug therapy: Secondary | ICD-10-CM | POA: Diagnosis not present

## 2019-11-29 DIAGNOSIS — F17210 Nicotine dependence, cigarettes, uncomplicated: Secondary | ICD-10-CM | POA: Diagnosis not present

## 2019-11-29 DIAGNOSIS — G894 Chronic pain syndrome: Secondary | ICD-10-CM | POA: Diagnosis not present

## 2019-11-29 DIAGNOSIS — M353 Polymyalgia rheumatica: Secondary | ICD-10-CM | POA: Diagnosis not present

## 2019-12-15 ENCOUNTER — Ambulatory Visit (HOSPITAL_COMMUNITY)
Admission: RE | Admit: 2019-12-15 | Discharge: 2019-12-15 | Disposition: A | Discharge status: Home / Self Care | Payer: Medicare HMO | Source: Ambulatory Visit | Attending: Critical Care Medicine | Admitting: Critical Care Medicine

## 2019-12-15 DIAGNOSIS — I10 Essential (primary) hypertension: Secondary | ICD-10-CM | POA: Insufficient documentation

## 2019-12-15 DIAGNOSIS — F17200 Nicotine dependence, unspecified, uncomplicated: Secondary | ICD-10-CM | POA: Insufficient documentation

## 2019-12-15 DIAGNOSIS — Z9981 Dependence on supplemental oxygen: Secondary | ICD-10-CM | POA: Insufficient documentation

## 2019-12-15 DIAGNOSIS — J9611 Chronic respiratory failure with hypoxia: Secondary | ICD-10-CM

## 2019-12-15 DIAGNOSIS — R42 Dizziness and giddiness: Secondary | ICD-10-CM | POA: Insufficient documentation

## 2019-12-15 DIAGNOSIS — I2510 Atherosclerotic heart disease of native coronary artery without angina pectoris: Secondary | ICD-10-CM | POA: Insufficient documentation

## 2019-12-15 DIAGNOSIS — Z720 Tobacco use: Secondary | ICD-10-CM

## 2019-12-15 DIAGNOSIS — J439 Emphysema, unspecified: Secondary | ICD-10-CM | POA: Diagnosis not present

## 2019-12-15 DIAGNOSIS — E119 Type 2 diabetes mellitus without complications: Secondary | ICD-10-CM | POA: Diagnosis not present

## 2019-12-15 NOTE — Progress Notes (Signed)
*  PRELIMINARY RESULTS* Echocardiogram 2D Echocardiogram has been performed.  Candace Harris 12/15/2019, 12:02 PM

## 2019-12-15 NOTE — Progress Notes (Signed)
Please let Ms. Candace Harris know that her heart squeeze is good and she does not have pulmonary hypertension with strain on her heart that I was worried about.  Thanks!  LPC

## 2019-12-19 DIAGNOSIS — J449 Chronic obstructive pulmonary disease, unspecified: Secondary | ICD-10-CM | POA: Diagnosis not present

## 2019-12-20 DIAGNOSIS — J449 Chronic obstructive pulmonary disease, unspecified: Secondary | ICD-10-CM | POA: Diagnosis not present

## 2019-12-21 DIAGNOSIS — F17210 Nicotine dependence, cigarettes, uncomplicated: Secondary | ICD-10-CM | POA: Diagnosis not present

## 2019-12-21 DIAGNOSIS — G894 Chronic pain syndrome: Secondary | ICD-10-CM | POA: Diagnosis not present

## 2019-12-21 DIAGNOSIS — Z79899 Other long term (current) drug therapy: Secondary | ICD-10-CM | POA: Diagnosis not present

## 2019-12-21 DIAGNOSIS — M353 Polymyalgia rheumatica: Secondary | ICD-10-CM | POA: Diagnosis not present

## 2019-12-28 DIAGNOSIS — Z79899 Other long term (current) drug therapy: Secondary | ICD-10-CM | POA: Diagnosis not present

## 2019-12-28 DIAGNOSIS — M542 Cervicalgia: Secondary | ICD-10-CM | POA: Diagnosis not present

## 2019-12-28 DIAGNOSIS — M25519 Pain in unspecified shoulder: Secondary | ICD-10-CM | POA: Diagnosis not present

## 2019-12-28 DIAGNOSIS — M810 Age-related osteoporosis without current pathological fracture: Secondary | ICD-10-CM | POA: Diagnosis not present

## 2019-12-28 DIAGNOSIS — M25511 Pain in right shoulder: Secondary | ICD-10-CM | POA: Diagnosis not present

## 2019-12-28 DIAGNOSIS — Z6824 Body mass index (BMI) 24.0-24.9, adult: Secondary | ICD-10-CM | POA: Diagnosis not present

## 2019-12-28 DIAGNOSIS — R700 Elevated erythrocyte sedimentation rate: Secondary | ICD-10-CM | POA: Diagnosis not present

## 2019-12-28 DIAGNOSIS — G894 Chronic pain syndrome: Secondary | ICD-10-CM | POA: Diagnosis not present

## 2019-12-28 DIAGNOSIS — M25512 Pain in left shoulder: Secondary | ICD-10-CM | POA: Diagnosis not present

## 2020-01-05 DIAGNOSIS — E119 Type 2 diabetes mellitus without complications: Secondary | ICD-10-CM | POA: Diagnosis not present

## 2020-01-05 DIAGNOSIS — F419 Anxiety disorder, unspecified: Secondary | ICD-10-CM | POA: Diagnosis not present

## 2020-01-05 DIAGNOSIS — I10 Essential (primary) hypertension: Secondary | ICD-10-CM | POA: Diagnosis not present

## 2020-01-05 DIAGNOSIS — J449 Chronic obstructive pulmonary disease, unspecified: Secondary | ICD-10-CM | POA: Diagnosis not present

## 2020-01-18 DIAGNOSIS — J449 Chronic obstructive pulmonary disease, unspecified: Secondary | ICD-10-CM | POA: Diagnosis not present

## 2020-01-19 DIAGNOSIS — J449 Chronic obstructive pulmonary disease, unspecified: Secondary | ICD-10-CM | POA: Diagnosis not present

## 2020-01-20 DIAGNOSIS — G894 Chronic pain syndrome: Secondary | ICD-10-CM | POA: Diagnosis not present

## 2020-01-20 DIAGNOSIS — M353 Polymyalgia rheumatica: Secondary | ICD-10-CM | POA: Diagnosis not present

## 2020-01-20 DIAGNOSIS — F1120 Opioid dependence, uncomplicated: Secondary | ICD-10-CM | POA: Diagnosis not present

## 2020-01-20 DIAGNOSIS — F17210 Nicotine dependence, cigarettes, uncomplicated: Secondary | ICD-10-CM | POA: Diagnosis not present

## 2020-01-20 DIAGNOSIS — Z79899 Other long term (current) drug therapy: Secondary | ICD-10-CM | POA: Diagnosis not present

## 2020-01-25 ENCOUNTER — Ambulatory Visit: Payer: Medicare HMO | Admitting: Critical Care Medicine

## 2020-01-25 NOTE — Progress Notes (Deleted)
Synopsis: Referred in May 2021 for COPD by Veverly Fells, MD  Subjective:   PATIENT ID: Candace Harris GENDER: female DOB: [birth year], MRN: [id number]  No chief complaint on file.   HPI  Severe COPD Chronic hypoxic resp fail Ongoing tobacco     OV 11/24/19: Candace Harris is a 69 year old woman with a history of tobacco abuse and COPD who presents for evaluation of dyspnea on exertion.  She is accompanied today by her son Candace Harris.  She is previously a patient of Dr. Juanetta Gosling.  She was diagnosed with COPD about 2 years ago but has had progressively worsening shortness of breath over the last 2 years.  She also has cough, sputum production, wheezing, but dyspnea on exertion is her main complaint.  Her activity is fairly limited; she is only able to walk a few steps around her house without stopping.  She reports that 2 years ago she was still able to walk around outside and was able to go fishing with her son.  She is on 3 L supplemental oxygen at home.  She was prescribed 2.5 L but had ongoing dyspnea on titrated up to 3 L.  Her home saturations are usually in the 90s, but sometimes drop into the 80s.  She is currently using her Trelegy inhaler daily with frequent albuterol.  She continues to smoke 0.25- 0.5 packs/day; she has smoked 0.5 ppd for the last 50 years.  She is not currently planning on quitting.  Has been unsuccessful trying nicotine replacement therapy and Chantix in the past.  With Chantix she had nausea, anger, was withdrawn.  She has a sister who has COPD.  She is on chronic steroids for polymyalgia rheumatica-referral to rheumatology pending.  She is on opiates for chronic pain all over.  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   Past Medical History:  Diagnosis Date  . Anxiety   . Bipolar affective disorder (HCC)   . CAD (coronary artery disease)   . Chronic respiratory failure (HCC)   . COPD (chronic obstructive pulmonary disease) (HCC)   . DM (diabetes  mellitus) (HCC)   . GERD (gastroesophageal reflux disease)   . HTN (hypertension)   . Polymyalgia rheumatica (HCC)      Family History  Problem Relation Age of Onset  . Hypertension Mother      Past Surgical History:  Procedure Laterality Date  . ANTERIOR FUSION CERVICAL SPINE  2012   C5-7    Social History   Socioeconomic History  . Marital status: Single    Spouse name: Not on file  . Number of children: Not on file  . Years of education: Not on file  . Highest education level: Not on file  Occupational History  . Not on file  Tobacco Use  . Smoking status: Current Every Day Smoker    Packs/day: 0.50    Years: 50.00    Pack years: 25.00    Types: Cigarettes  . Smokeless tobacco: Never Used  . Tobacco comment: 5-10 cigarettes per day/// 11/24/19 ARJ  Substance and Sexual Activity  . Alcohol use: Not on file  . Drug use: Not on file  . Sexual activity: Not on file  Other Topics Concern  . Not on file  Social History Narrative  . Not on file   Social Determinants of Health   Financial Resource Strain:   . Difficulty of Paying Living Expenses:   Food Insecurity:   . Worried About Running  Out of Food in the Last Year:   . Ran Out of Food in the Last Year:   Transportation Needs:   . Lack of Transportation (Medical):   Marland Kitchen Lack of Transportation (Non-Medical):   Physical Activity:   . Days of Exercise per Week:   . Minutes of Exercise per Session:   Stress:   . Feeling of Stress :   Social Connections:   . Frequency of Communication with Friends and Family:   . Frequency of Social Gatherings with Friends and Family:   . Attends Religious Services:   . Active Member of Clubs or Organizations:   . Attends Banker Meetings:   Marland Kitchen Marital Status:   Intimate Partner Violence:   . Fear of Current or Ex-Partner:   . Emotionally Abused:   Marland Kitchen Physically Abused:   . Sexually Abused:      Allergies  Allergen Reactions  . Alendronate Other (See  Comments)    Sore muscles, burning in chest Sore muscles, burning in chest   . Ibuprofen Nausea And Vomiting    Other reaction(s): Unknown   . Ramipril Nausea And Vomiting and Other (See Comments)  . Sulfabenzamide Other (See Comments)    HEADACHE      Immunization History  Administered Date(s) Administered  . Pneumococcal Polysaccharide-23 06/03/2017    Outpatient Medications Prior to Visit  Medication Sig Dispense Refill  . albuterol (VENTOLIN HFA) 108 (90 Base) MCG/ACT inhaler INHALE 2 PUFFS EVERY 4 HOURS AS NEEDED FOR WHEEZING    . amLODipine (NORVASC) 2.5 MG tablet TAKE 1 TABLET EVERY DAY    . ARIPiprazole (ABILIFY) 2 MG tablet TAKE 1 TABLET EVERY DAY    . celecoxib (CELEBREX) 200 MG capsule Take 200 mg by mouth daily.    Marland Kitchen HYDROcodone-acetaminophen (NORCO) 10-325 MG tablet Take 1 tablet by mouth 4 (four) times daily as needed.    . metFORMIN (GLUCOPHAGE) 500 MG tablet TAKE 1 TABLET TWICE DAILY WITH MEALS    . omeprazole (PRILOSEC) 40 MG capsule Take 40 mg by mouth every morning.    . ondansetron (ZOFRAN) 4 MG tablet Take 4 mg by mouth daily.    . sertraline (ZOLOFT) 25 MG tablet Take 25 mg by mouth daily.    . sertraline (ZOLOFT) 50 MG tablet Take 50 mg by mouth daily.    . TRELEGY ELLIPTA 100-62.5-25 MCG/INH AEPB Inhale 1 puff into the lungs daily.    Marland Kitchen XTAMPZA ER 9 MG C12A Take 1 capsule by mouth 2 (two) times daily.     No facility-administered medications prior to visit.    Review of Systems  Constitutional: Negative for weight loss.       Poor appetite  HENT: Negative for congestion and sore throat.   Respiratory: Positive for cough, sputum production, shortness of breath and wheezing.   Cardiovascular: Negative for leg swelling.       Episodes of sharp CP and pressure when sitting still  Gastrointestinal: Positive for nausea. Negative for diarrhea and vomiting.  Musculoskeletal: Positive for myalgias.  Neurological:       Dizziness with activity      Objective:   There were no vitals filed for this visit.   on 3 LPM   BMI Readings from Last 3 Encounters:  11/24/19 25.36 kg/m   Wt Readings from Last 3 Encounters:  11/24/19 134 lb 3.2 oz (60.9 kg)    Physical Exam    CBC No results found for: WBC, RBC, HGB, HCT, PLT, MCV,  MCH, MCHC, RDW, LYMPHSABS, MONOABS, EOSABS, BASOSABS  CHEMISTRY No results for input(s): NA, K, CL, CO2, GLUCOSE, BUN, CREATININE, CALCIUM, MG, PHOS in the last 168 hours. CrCl cannot be calculated (No successful lab value found.).   Chest Imaging- films reviewed: CXR, 2 view 11/16/2004-hyperinflation with flattened hemidiaphragms and increased retrosternal airspace.  Mild airway thickening.  Pulmonary Functions Testing Results: PFT Results Latest Ref Rng & Units 03/10/2019  FVC-Pre L 2.11  FVC-Predicted Pre % 98  FVC-Post L 1.99  FVC-Predicted Post % 93  Pre FEV1/FVC % % 67  Post FEV1/FCV % % 70  FEV1-Pre L 1.42  FEV1-Predicted Pre % 85  FEV1-Post L 1.39  DLCO UNC% % 31  DLCO COR %Predicted % 36   2020-mild obstruction without significant bronchodilator reversibility. Severely impaired diffusion.   Echocardiogram 12/15/2019: LVEF 65 to 70%, grade 1 diastolic dysfunction.  Normal LA, RV size and function, RA.  Normal RA pressure.  Normal valves.      Assessment & Plan:     ICD-10-CM   1. Chronic respiratory failure with hypoxia (HCC)  J96.11   2. Tobacco abuse  Z72.0   3. Pulmonary emphysema, unspecified emphysema type (HCC)  J43.9     COPD with chronic hypoxic respiratory failure ongoing tobacco abuse -Walked in the office today.  Desaturated requiring 6 L of oxygen with ambulation.  Continue 3 L at rest and increased to 6 L with activity and sleep. -Continue Trelegy once daily -Continue albuterol every 4 hours as needed -Strongly recommend smoking cessation -Up-to-date on pneumococcal vaccines.  Recommend Covid and annual flu vaccines.  Discussed the importance of preventing  exacerbations to prevent disease progression.  Severe dyspnea on exertion proportion to PFT severity -Echocardiogram to evaluate for possible pulmonary hypertension  Tobacco abuse -Recommended cessation discussed the importance of quitting smoking to control her disease.  I recommend that she decrease her smoking to 5 cigarettes/day and use nicotine replacement therapy and behavioral modifications to help avoid smoking more frequently.  Her family gets her cigarettes for her. -Discussed the possibility of lung cancer screening, although I am concerned with her level of deconditioning and severe respiratory failure that she would not be a candidate for any type of treatment if lung cancer were found.  They will think about it.    RTC in 2 months.    Current Outpatient Medications:  .  albuterol (VENTOLIN HFA) 108 (90 Base) MCG/ACT inhaler, INHALE 2 PUFFS EVERY 4 HOURS AS NEEDED FOR WHEEZING, Disp: , Rfl:  .  amLODipine (NORVASC) 2.5 MG tablet, TAKE 1 TABLET EVERY DAY, Disp: , Rfl:  .  ARIPiprazole (ABILIFY) 2 MG tablet, TAKE 1 TABLET EVERY DAY, Disp: , Rfl:  .  celecoxib (CELEBREX) 200 MG capsule, Take 200 mg by mouth daily., Disp: , Rfl:  .  HYDROcodone-acetaminophen (NORCO) 10-325 MG tablet, Take 1 tablet by mouth 4 (four) times daily as needed., Disp: , Rfl:  .  metFORMIN (GLUCOPHAGE) 500 MG tablet, TAKE 1 TABLET TWICE DAILY WITH MEALS, Disp: , Rfl:  .  omeprazole (PRILOSEC) 40 MG capsule, Take 40 mg by mouth every morning., Disp: , Rfl:  .  ondansetron (ZOFRAN) 4 MG tablet, Take 4 mg by mouth daily., Disp: , Rfl:  .  sertraline (ZOLOFT) 25 MG tablet, Take 25 mg by mouth daily., Disp: , Rfl:  .  sertraline (ZOLOFT) 50 MG tablet, Take 50 mg by mouth daily., Disp: , Rfl:  .  TRELEGY ELLIPTA 100-62.5-25 MCG/INH AEPB, Inhale 1 puff into the  lungs daily., Disp: , Rfl:  .  XTAMPZA ER 9 MG C12A, Take 1 capsule by mouth 2 (two) times daily., Disp: , Rfl:     Steffanie DunnLaura P Isadora Delorey, DO [place]  [facility] 01/25/2020 8:26 AM

## 2020-02-18 DIAGNOSIS — J449 Chronic obstructive pulmonary disease, unspecified: Secondary | ICD-10-CM | POA: Diagnosis not present

## 2020-02-19 DIAGNOSIS — J449 Chronic obstructive pulmonary disease, unspecified: Secondary | ICD-10-CM | POA: Diagnosis not present

## 2020-03-07 DIAGNOSIS — E119 Type 2 diabetes mellitus without complications: Secondary | ICD-10-CM | POA: Diagnosis not present

## 2020-03-07 DIAGNOSIS — J449 Chronic obstructive pulmonary disease, unspecified: Secondary | ICD-10-CM | POA: Diagnosis not present

## 2020-03-07 DIAGNOSIS — K219 Gastro-esophageal reflux disease without esophagitis: Secondary | ICD-10-CM | POA: Diagnosis not present

## 2020-03-07 DIAGNOSIS — F419 Anxiety disorder, unspecified: Secondary | ICD-10-CM | POA: Diagnosis not present

## 2020-03-07 DIAGNOSIS — D509 Iron deficiency anemia, unspecified: Secondary | ICD-10-CM | POA: Diagnosis not present

## 2020-03-10 DIAGNOSIS — M353 Polymyalgia rheumatica: Secondary | ICD-10-CM | POA: Diagnosis not present

## 2020-03-10 DIAGNOSIS — Z79899 Other long term (current) drug therapy: Secondary | ICD-10-CM | POA: Diagnosis not present

## 2020-03-10 DIAGNOSIS — G894 Chronic pain syndrome: Secondary | ICD-10-CM | POA: Diagnosis not present

## 2020-03-11 DIAGNOSIS — F332 Major depressive disorder, recurrent severe without psychotic features: Secondary | ICD-10-CM | POA: Diagnosis not present

## 2020-03-11 DIAGNOSIS — F411 Generalized anxiety disorder: Secondary | ICD-10-CM | POA: Diagnosis not present

## 2020-03-11 DIAGNOSIS — Z79899 Other long term (current) drug therapy: Secondary | ICD-10-CM | POA: Diagnosis not present

## 2020-03-11 DIAGNOSIS — R5383 Other fatigue: Secondary | ICD-10-CM | POA: Diagnosis not present

## 2020-03-11 DIAGNOSIS — E559 Vitamin D deficiency, unspecified: Secondary | ICD-10-CM | POA: Diagnosis not present

## 2020-03-15 DIAGNOSIS — H2512 Age-related nuclear cataract, left eye: Secondary | ICD-10-CM | POA: Diagnosis not present

## 2020-03-15 DIAGNOSIS — H25042 Posterior subcapsular polar age-related cataract, left eye: Secondary | ICD-10-CM | POA: Diagnosis not present

## 2020-03-20 DIAGNOSIS — J449 Chronic obstructive pulmonary disease, unspecified: Secondary | ICD-10-CM | POA: Diagnosis not present

## 2020-03-21 DIAGNOSIS — J449 Chronic obstructive pulmonary disease, unspecified: Secondary | ICD-10-CM | POA: Diagnosis not present

## 2020-04-04 DIAGNOSIS — G894 Chronic pain syndrome: Secondary | ICD-10-CM | POA: Diagnosis not present

## 2020-04-04 DIAGNOSIS — M353 Polymyalgia rheumatica: Secondary | ICD-10-CM | POA: Diagnosis not present

## 2020-04-04 DIAGNOSIS — Z79899 Other long term (current) drug therapy: Secondary | ICD-10-CM | POA: Diagnosis not present

## 2020-04-19 DIAGNOSIS — J449 Chronic obstructive pulmonary disease, unspecified: Secondary | ICD-10-CM | POA: Diagnosis not present

## 2020-04-20 DIAGNOSIS — J449 Chronic obstructive pulmonary disease, unspecified: Secondary | ICD-10-CM | POA: Diagnosis not present

## 2020-05-01 DIAGNOSIS — I10 Essential (primary) hypertension: Secondary | ICD-10-CM | POA: Diagnosis not present

## 2020-05-01 DIAGNOSIS — E119 Type 2 diabetes mellitus without complications: Secondary | ICD-10-CM | POA: Diagnosis not present

## 2020-05-01 DIAGNOSIS — F419 Anxiety disorder, unspecified: Secondary | ICD-10-CM | POA: Diagnosis not present

## 2020-05-01 DIAGNOSIS — Z23 Encounter for immunization: Secondary | ICD-10-CM | POA: Diagnosis not present

## 2020-05-01 DIAGNOSIS — J449 Chronic obstructive pulmonary disease, unspecified: Secondary | ICD-10-CM | POA: Diagnosis not present

## 2020-05-15 DIAGNOSIS — G894 Chronic pain syndrome: Secondary | ICD-10-CM | POA: Diagnosis not present

## 2020-05-15 DIAGNOSIS — M353 Polymyalgia rheumatica: Secondary | ICD-10-CM | POA: Diagnosis not present

## 2020-05-15 DIAGNOSIS — Z79899 Other long term (current) drug therapy: Secondary | ICD-10-CM | POA: Diagnosis not present

## 2020-05-20 DIAGNOSIS — J449 Chronic obstructive pulmonary disease, unspecified: Secondary | ICD-10-CM | POA: Diagnosis not present

## 2020-05-21 DIAGNOSIS — J449 Chronic obstructive pulmonary disease, unspecified: Secondary | ICD-10-CM | POA: Diagnosis not present

## 2020-05-30 DIAGNOSIS — D509 Iron deficiency anemia, unspecified: Secondary | ICD-10-CM | POA: Diagnosis not present

## 2020-05-30 DIAGNOSIS — R42 Dizziness and giddiness: Secondary | ICD-10-CM | POA: Diagnosis not present

## 2020-05-30 DIAGNOSIS — E538 Deficiency of other specified B group vitamins: Secondary | ICD-10-CM | POA: Diagnosis not present

## 2020-07-15 ENCOUNTER — Emergency Department (HOSPITAL_COMMUNITY)
Admission: EM | Admit: 2020-07-15 | Discharge: 2020-07-15 | Disposition: A | Discharge status: Home / Self Care | Payer: Medicare HMO | Attending: Emergency Medicine | Admitting: Emergency Medicine

## 2020-07-15 ENCOUNTER — Encounter (HOSPITAL_COMMUNITY): Payer: Self-pay | Admitting: Emergency Medicine

## 2020-07-15 ENCOUNTER — Other Ambulatory Visit: Payer: Self-pay

## 2020-07-15 ENCOUNTER — Emergency Department (HOSPITAL_COMMUNITY): Payer: Medicare HMO

## 2020-07-15 DIAGNOSIS — R000 Tachycardia, unspecified: Secondary | ICD-10-CM | POA: Diagnosis not present

## 2020-07-15 DIAGNOSIS — F419 Anxiety disorder, unspecified: Secondary | ICD-10-CM | POA: Diagnosis not present

## 2020-07-15 DIAGNOSIS — J441 Chronic obstructive pulmonary disease with (acute) exacerbation: Secondary | ICD-10-CM | POA: Insufficient documentation

## 2020-07-15 DIAGNOSIS — Z79899 Other long term (current) drug therapy: Secondary | ICD-10-CM | POA: Insufficient documentation

## 2020-07-15 DIAGNOSIS — R0602 Shortness of breath: Secondary | ICD-10-CM

## 2020-07-15 DIAGNOSIS — F17210 Nicotine dependence, cigarettes, uncomplicated: Secondary | ICD-10-CM | POA: Insufficient documentation

## 2020-07-15 DIAGNOSIS — Z7984 Long term (current) use of oral hypoglycemic drugs: Secondary | ICD-10-CM | POA: Diagnosis not present

## 2020-07-15 DIAGNOSIS — Z20822 Contact with and (suspected) exposure to covid-19: Secondary | ICD-10-CM | POA: Insufficient documentation

## 2020-07-15 DIAGNOSIS — I2510 Atherosclerotic heart disease of native coronary artery without angina pectoris: Secondary | ICD-10-CM | POA: Insufficient documentation

## 2020-07-15 DIAGNOSIS — I10 Essential (primary) hypertension: Secondary | ICD-10-CM | POA: Diagnosis not present

## 2020-07-15 DIAGNOSIS — E119 Type 2 diabetes mellitus without complications: Secondary | ICD-10-CM | POA: Diagnosis not present

## 2020-07-15 LAB — CBC WITH DIFFERENTIAL/PLATELET
Abs Immature Granulocytes: 0.08 10*3/uL — ABNORMAL HIGH (ref 0.00–0.07)
Basophils Absolute: 0.1 10*3/uL (ref 0.0–0.1)
Basophils Relative: 0 %
Eosinophils Absolute: 0.1 10*3/uL (ref 0.0–0.5)
Eosinophils Relative: 1 %
HCT: 35.3 % — ABNORMAL LOW (ref 36.0–46.0)
Hemoglobin: 11.3 g/dL — ABNORMAL LOW (ref 12.0–15.0)
Immature Granulocytes: 0 %
Lymphocytes Relative: 17 %
Lymphs Abs: 3.2 10*3/uL (ref 0.7–4.0)
MCH: 27.4 pg (ref 26.0–34.0)
MCHC: 32 g/dL (ref 30.0–36.0)
MCV: 85.5 fL (ref 80.0–100.0)
Monocytes Absolute: 1.8 10*3/uL — ABNORMAL HIGH (ref 0.1–1.0)
Monocytes Relative: 10 %
Neutro Abs: 13.6 10*3/uL — ABNORMAL HIGH (ref 1.7–7.7)
Neutrophils Relative %: 72 %
Platelets: 404 10*3/uL — ABNORMAL HIGH (ref 150–400)
RBC: 4.13 MIL/uL (ref 3.87–5.11)
RDW: 16 % — ABNORMAL HIGH (ref 11.5–15.5)
WBC: 18.9 10*3/uL — ABNORMAL HIGH (ref 4.0–10.5)
nRBC: 0 % (ref 0.0–0.2)

## 2020-07-15 LAB — URINALYSIS, ROUTINE W REFLEX MICROSCOPIC
Bilirubin Urine: NEGATIVE
Glucose, UA: NEGATIVE mg/dL
Hgb urine dipstick: NEGATIVE
Ketones, ur: NEGATIVE mg/dL
Leukocytes,Ua: NEGATIVE
Nitrite: NEGATIVE
Protein, ur: NEGATIVE mg/dL
Specific Gravity, Urine: 1.004 — ABNORMAL LOW (ref 1.005–1.030)
pH: 7 (ref 5.0–8.0)

## 2020-07-15 LAB — TROPONIN I (HIGH SENSITIVITY)
Troponin I (High Sensitivity): 5 ng/L (ref <18)
Troponin I (High Sensitivity): 4 ng/L (ref <18)

## 2020-07-15 LAB — BASIC METABOLIC PANEL
Anion gap: 13 (ref 5–15)
BUN: 9 mg/dL (ref 8–23)
CO2: 23 mmol/L (ref 22–32)
Calcium: 10 mg/dL (ref 8.9–10.3)
Chloride: 101 mmol/L (ref 98–111)
Creatinine, Ser: 0.71 mg/dL (ref 0.44–1.00)
GFR, Estimated: >60 mL/min (ref >60)
Glucose, Bld: 113 mg/dL — ABNORMAL HIGH (ref 70–99)
Potassium: 3.6 mmol/L (ref 3.5–5.1)
Sodium: 137 mmol/L (ref 135–145)

## 2020-07-15 LAB — RESP PANEL BY RT-PCR (FLU A&B, COVID) ARPGX2
Influenza A by PCR: NEGATIVE
Influenza B by PCR: NEGATIVE
SARS Coronavirus 2 by RT PCR: NEGATIVE

## 2020-07-15 MED ORDER — PREDNISONE 20 MG PO TABS: 60.0000 mg | ORAL_TABLET | Freq: Every day | ORAL | 0 refills | Status: AC

## 2020-07-15 MED ORDER — ALBUTEROL SULFATE HFA 108 (90 BASE) MCG/ACT IN AERS: 6.0000 | INHALATION_SPRAY | Freq: Once | RESPIRATORY_TRACT | Status: DC

## 2020-07-15 MED ORDER — RACEPINEPHRINE HCL 2.25 % IN NEBU
0.5000 mL | INHALATION_SOLUTION | Freq: Once | RESPIRATORY_TRACT | Status: AC
  Administered 2020-07-15: 0.5 mL via RESPIRATORY_TRACT
  Filled 2020-07-15: qty 0.5

## 2020-07-15 MED ORDER — HYDROCODONE-ACETAMINOPHEN 5-325 MG PO TABS
1.0000 | ORAL_TABLET | Freq: Once | ORAL | Status: AC
  Administered 2020-07-15: 1 via ORAL
  Filled 2020-07-15: qty 1

## 2020-07-15 MED ORDER — LORAZEPAM 2 MG/ML IJ SOLN
0.5000 mg | Freq: Once | INTRAMUSCULAR | Status: AC
  Administered 2020-07-15: 0.5 mg via INTRAVENOUS
  Filled 2020-07-15: qty 1

## 2020-07-15 MED ORDER — METHOCARBAMOL 500 MG PO TABS: 500.0000 mg | ORAL_TABLET | Freq: Once | ORAL | Status: DC

## 2020-07-15 MED ORDER — METHYLPREDNISOLONE SODIUM SUCC 125 MG IJ SOLR
125.0000 mg | Freq: Once | INTRAMUSCULAR | Status: AC
  Administered 2020-07-15: 125 mg via INTRAVENOUS
  Filled 2020-07-15: qty 2

## 2020-07-15 NOTE — ED Notes (Signed)
Pt's ride is not outside, called family to come get her, state that they will be here in about .

## 2020-07-15 NOTE — ED Provider Notes (Signed)
[facility] EMERGENCY DEPARTMENT Provider Note   CSN: [id number] Arrival date & time: 07/15/20  3976     History Chief Complaint  Patient presents with  . Shortness of Breath    Candace Harris is a 70 y.o. female.  Candace Harris is a 70 y.o. female with a history of COPD, CAD, hypertension, diabetes, bipolar disorder, anxiety, polymyalgia rheumatica, who presents to the emergency department for evaluation of worsening shortness of breath.  Patient states that her shortness of breath has seemed a bit worse over the past month but has been giving her more trouble in particular since Christmas Eve.  She typically wears 6 L of O2 at all times, but has intermittently had to turn her oxygen up to 9 L at home since Christmas Eve due to worsening shortness of breath.  She has been using her Trelegy and albuterol at home without improvement.  Reports her chest feels congested and she has been coughing but cannot seem to cough up the mucus she feels in her chest.  She denies any fevers or chills.  Reports sore throat, which is worse with coughing.  She denies any known sick contacts, has not had any Covid vaccinations.  She denies associated chest pain but reports severe pain in both shoulders and arms, worse on the right than left.  She states that has been going on for several days, denies any inciting injury or fall.  No numbness or tingling in her upper extremities.  No weakness.  No associated abdominal pain, nausea, vomiting or diarrhea.  With EMS patient received DuoNeb with some improvement, no other treatments prior to arrival.  The history is provided by the patient.       Past Medical History:  Diagnosis Date  . Anxiety   . Bipolar affective disorder (HCC)   . CAD (coronary artery disease)   . Chronic respiratory failure (HCC)   . COPD (chronic obstructive pulmonary disease) (HCC)   . DM (diabetes mellitus) (HCC)   . GERD (gastroesophageal reflux disease)   . HTN (hypertension)   .  Polymyalgia rheumatica (HCC)     There are no problems to display for this patient.   Past Surgical History:  Procedure Laterality Date  . ANTERIOR FUSION CERVICAL SPINE  2012   C5-7     OB History   No obstetric history on file.     Family History  Problem Relation Age of Onset  . Hypertension Mother     Social History   Tobacco Use  . Smoking status: Current Every Day Smoker    Packs/day: 0.50    Years: 50.00    Pack years: 25.00    Types: Cigarettes  . Smokeless tobacco: Never Used  . Tobacco comment: 5-10 cigarettes per day/// 11/24/19 [facility]    Home Medications Prior to Admission medications   Medication Sig Start Date End Date Taking? Authorizing Provider  albuterol (VENTOLIN HFA) 108 (90 Base) MCG/ACT inhaler INHALE 2 PUFFS EVERY 4 HOURS AS NEEDED FOR WHEEZING 07/30/16   [provider]  amLODipine (NORVASC) 2.5 MG tablet TAKE 1 TABLET EVERY DAY 12/19/16   [provider]  ARIPiprazole (ABILIFY) 2 MG tablet TAKE 1 TABLET EVERY DAY 08/13/16   [provider]  celecoxib (CELEBREX) 200 MG capsule Take 200 mg by mouth daily. 11/09/19   [provider]  HYDROcodone-acetaminophen (NORCO) 10-325 MG tablet Take 1 tablet by mouth 4 (four) times daily as needed. 11/09/19   [provider]  metFORMIN (GLUCOPHAGE) 500  MG tablet TAKE 1 TABLET TWICE DAILY WITH MEALS 08/13/16   [provider]  omeprazole (PRILOSEC) 40 MG capsule Take 40 mg by mouth every morning. 11/09/19   [provider]  ondansetron (ZOFRAN) 4 MG tablet Take 4 mg by mouth daily. 11/11/19   [provider]  sertraline (ZOLOFT) 25 MG tablet Take 25 mg by mouth daily. 11/09/19   [provider]  sertraline (ZOLOFT) 50 MG tablet Take 50 mg by mouth daily. 11/11/19   [provider]  TRELEGY ELLIPTA 100-62.5-25 MCG/INH AEPB Inhale 1 puff into the lungs daily. 11/09/19   [provider]  XTAMPZA ER 9 MG C12A Take 1 capsule by  mouth 2 (two) times daily. 10/25/19   [provider]    Allergies    Alendronate, Ibuprofen, Ramipril, and Sulfabenzamide  Review of Systems   Review of Systems  Constitutional: Negative for chills and fever.  HENT: Negative.   Respiratory: Positive for cough, shortness of breath and wheezing.   Cardiovascular: Negative for chest pain.  Gastrointestinal: Negative for abdominal pain, nausea and vomiting.  Genitourinary: Negative for dysuria.  Musculoskeletal: Positive for arthralgias and myalgias.  Skin: Negative for color change and rash.  Neurological: Negative for dizziness, syncope and light-headedness.  All other systems reviewed and are negative.   Physical Exam Updated Vital Signs BP 124/87   Pulse (!) 101   Temp 99.1 F (37.3 C) (Oral)   Resp (!) 30   Ht 5' 1.5" (1.562 m)   Wt 60.8 kg   SpO2 98%   BMI 24.91 kg/m   Physical Exam Vitals and nursing note reviewed.  Constitutional:      General: She is not in acute distress.    Appearance: She is well-developed and well-nourished. She is ill-appearing. She is not diaphoretic.     Comments: Elderly female is somewhat ill-appearing with increased work of breathing, but in no acute distress, appears anxious  HENT:     Head: Normocephalic and atraumatic.     Mouth/Throat:     Mouth: Oropharynx is clear and moist. Mucous membranes are moist.     Pharynx: Oropharynx is clear.     Comments: Posterior oropharynx is clear without edema, or erythema, tolerating secretions, voice is slightly hoarse but patient reports this is chronic Eyes:     General:        Right eye: No discharge.        Left eye: No discharge.     Extraocular Movements: EOM normal.     Pupils: Pupils are equal, round, and reactive to light.  Neck:     Trachea: No tracheal deviation.     Comments: Patient with some upper airway noises, she does not have any inspiratory stridor per se but seems to have some high-pitched sounds associated with  forced expiration Cardiovascular:     Rate and Rhythm: Regular rhythm. Tachycardia present.     Pulses: Normal pulses and intact distal pulses.     Heart sounds: Normal heart sounds.     Comments: Mild tachycardia with regular rhythm Pulmonary:     Effort: No respiratory distress.     Breath sounds: Wheezing present. No rales.     Comments: Patient tachypneic with some increased respiratory effort, but able to speak in full sentences, patient with very forceful expiration with upper airway sounds, but on auscultation only some faint wheezes noted, but good air movement.  Patient with chronic 6 L O2 requirement, but briefly placed on room air  and maintained normal O2 saturations Abdominal:     General: Bowel sounds are normal. There is no distension.     Palpations: Abdomen is soft. There is no mass.     Tenderness: There is no abdominal tenderness. There is no guarding.  Musculoskeletal:        General: No deformity or edema.     Cervical back: Neck supple.     Right lower leg: No tenderness. No edema.     Left lower leg: No tenderness. No edema.  Skin:    General: Skin is warm and dry.     Capillary Refill: Capillary refill takes less than 2 seconds.  Neurological:     Mental Status: She is alert.     Coordination: Coordination normal.     Comments: Speech is clear, able to follow commands CN III-XII intact Normal strength in upper and lower extremities bilaterally including dorsiflexion and plantar flexion, strong and equal grip strength Sensation normal to light and sharp touch Moves extremities without ataxia, coordination intact   Psychiatric:        Mood and Affect: Mood is anxious.        Behavior: Behavior normal.     ED Results / Procedures / Treatments   Labs (all labs ordered are listed, but only abnormal results are displayed) Labs Reviewed  BASIC METABOLIC PANEL - Abnormal; Notable for the following components:      Result Value   Glucose, Bld 113 (*)    All  other components within normal limits  CBC WITH DIFFERENTIAL/PLATELET - Abnormal; Notable for the following components:   WBC 18.9 (*)    Hemoglobin 11.3 (*)    HCT 35.3 (*)    RDW 16.0 (*)    Platelets 404 (*)    Neutro Abs 13.6 (*)    Monocytes Absolute 1.8 (*)    Abs Immature Granulocytes 0.08 (*)    All other components within normal limits  URINALYSIS, ROUTINE W REFLEX MICROSCOPIC - Abnormal; Notable for the following components:   Specific Gravity, Urine 1.004 (*)    All other components within normal limits  RESP PANEL BY RT-PCR (FLU A&B, COVID) [id number]  TROPONIN I (HIGH SENSITIVITY)  TROPONIN I (HIGH SENSITIVITY)    EKG EKG Interpretation  Date/Time:  Saturday July 15 2020 09:22:24 EST Ventricular Rate:  111 PR Interval:    QRS Duration: 75 QT Interval:  387 QTC Calculation: 526 R Axis:   77 Text Interpretation: Sinus tachycardia Low voltage, precordial leads Borderline repolarization abnormality artifact. lower voltage chest leads compared to previous Confirmed by Arby Barrette [phone]) on 07/16/2020 9:21:43 PM   Radiology DG Chest Port 1 View  Result Date: 07/15/2020 CLINICAL DATA:  69 year old female with history of shortness of breath for 1 month. EXAM: PORTABLE CHEST 1 VIEW COMPARISON:  Chest x-ray 11/09/2004. FINDINGS: Lung volumes are increased with emphysematous changes. No consolidative airspace disease. No pleural effusions. No pneumothorax. No pulmonary nodule or mass noted. Pulmonary vasculature and the cardiomediastinal silhouette are within normal limits. Atherosclerosis in the thoracic aorta. Orthopedic fixation hardware in the lower cervical spine. IMPRESSION: 1.  No radiographic evidence of acute cardiopulmonary disease. 2. Emphysema. 3. Aortic atherosclerosis. Electronically Signed   By: Trudie Reed M.D.   On: 07/15/2020 10:08     Procedures Procedures (including critical care time)  Medications Ordered in ED Medications  methylPREDNISolone  sodium succinate (SOLU-MEDROL) 125 mg/2 mL injection 125 mg (125 mg Intravenous Given 07/15/20 1019)  LORazepam (ATIVAN) injection 0.5 mg (  0.5 mg Intravenous Given 07/15/20 1019)  Racepinephrine HCl 2.25 % nebulizer solution 0.5 mL (0.5 mLs Nebulization Given 07/15/20 1019)  HYDROcodone-acetaminophen (NORCO/VICODIN) 5-325 MG per tablet 1 tablet (1 tablet Oral Given 07/15/20 1305)    ED Course  I have reviewed the triage vital signs and the nursing notes.  Pertinent labs & imaging results that were available during my care of the patient were reviewed by me and considered in my medical decision making (see chart for details).    MDM Rules/Calculators/A&P                         70 year old female arrives via EMS with shortness of breath, was noted to be wheezing and was given 2 duo nebs during transport with some improvement.  Has COPD with chronic 6 L oxygen requirement.  On arrival patient is mildly tachycardic, she is tachypneic with increased respiratory effort and very forced expiration.  Lungs overall sound clear and significantly improved with some faint wheezing and good air movement and patient is able to speak in full sentences, but she does seem to be having some upper airway noises.  On auscultation she does not have inspiratory stridor but has some high-pitched noises on expiration, suspect a lot of this is related to forced expiration and patient also appears very anxious and think this may be contributing to her increased respiratory effort as well.  Case was discussed with Dr. Estell Harpin after initial evaluation, and he went and evaluated the patient as well, agrees that this seems more likely to be expiratory upper airway sounds.  Recommends giving racemic epi and Ativan, will also give IV steroids, will hold off on additional albuterol at this time.  Patient also complaining of bilateral shoulder pain worse on the right than left that has been present for a few days, patient does have a history of  PMR which may be contributing.  No associated chest pain, no numbness or weakness, radicular symptoms.  Pain is not pleuritic, given focal lung sounds, low suspicion for PE.  Will check basic lab work, troponin, EKG, chest x-ray and Covid test.  On reevaluation after racemic epi and Ativan patient's respiratory effort has significantly improved and upper airway sounds have resolved.  Patient is able to maintain full sentences and has normal O2 saturations.  I have independently ordered, reviewed and interpreted all labs and imaging: CBC: Patient does have a leukocytosis of 18.9, but with no associated fever or SIRS criteria, no other focal infectious symptoms aside from respiratory symptoms.  Assessing for potential pneumonia, will also check urinalysis, no abdominal pain noted. BMP: Glucose of 113 but no other electrolyte derangements and normal renal function Troponin: Negative x2 Covid/flu: Negative  Chest x-ray with no acute cardiopulmonary disease, emphysema and aortic atherosclerosis noted.   EKG with sinus tachycardia, but no significant changes when compared to prior.   Patient monitored in the ED for several hours with no return of upper airway sounds, her symptoms had significantly improved, suspect some COPD exacerbation, and that anxiety in the setting of shortness of breath worsened symptoms.  Patient ambulated in the department with no hypoxia.  Will treat with a course of steroids and close PCP follow-up.  Patient expresses understanding and agreement.  Discharged home in good condition.  Final Clinical Impression(s) / ED Diagnoses Final diagnoses:  SOB (shortness of breath)  COPD exacerbation (HCC)  Anxiety    Rx / DC Orders ED Discharge Orders  Ordered    predniSONE (DELTASONE) 20 MG tablet  Daily        07/15/20 1527           Legrand Rams 07/20/20 2300    Bethann Berkshire, MD 07/24/20 1511

## 2020-07-15 NOTE — ED Notes (Signed)
Pt in bed with eyes closed, resps even and unlabored, pt remains on 6L O2, pt arouses easily to verbal stim, pt states that she is ready to go home, pt denies pain at this time, informed pt that her ride was here, pt from dpt via wc.

## 2020-07-15 NOTE — ED Notes (Signed)
Pt did not ambulate well and was very unsteady with walker. O2 stayed at 100%.

## 2020-07-15 NOTE — ED Triage Notes (Signed)
Per EMS, pt reports history of COPD and increased difficulty breathing x1 month. Pt reports wears 6 liters all the time and reports turned oxygen up to 9 liters when EMS arrived. Pt received neb treatment and albuterol en route. Pt room air saturation 100%. Pt reports generalized bilateral shoulder pain. Pt denies any known injury.

## 2020-07-15 NOTE — Discharge Instructions (Signed)
Your evaluation today has been reassuring.  Your chest x-ray is clear and labs look good.  Your breathing has improved significantly.  I do suspect anxiety was contributing to your symptoms today as well.  Please continue using your inhalers as directed and take steroids for the next 5 days.

## 2020-08-15 ENCOUNTER — Other Ambulatory Visit: Payer: Self-pay

## 2020-08-15 ENCOUNTER — Encounter (HOSPITAL_COMMUNITY): Payer: Self-pay | Admitting: Emergency Medicine

## 2020-08-15 ENCOUNTER — Inpatient Hospital Stay (HOSPITAL_COMMUNITY)
Admission: EM | Admit: 2020-08-15 | Discharge: 2020-08-18 | DRG: 190 | Disposition: A | Discharge status: Home Health | Payer: Medicare HMO | Attending: Family Medicine | Admitting: Family Medicine

## 2020-08-15 ENCOUNTER — Emergency Department (HOSPITAL_COMMUNITY): Payer: Medicare HMO

## 2020-08-15 DIAGNOSIS — J439 Emphysema, unspecified: Principal | ICD-10-CM | POA: Diagnosis present

## 2020-08-15 DIAGNOSIS — K219 Gastro-esophageal reflux disease without esophagitis: Secondary | ICD-10-CM

## 2020-08-15 DIAGNOSIS — Z8249 Family history of ischemic heart disease and other diseases of the circulatory system: Secondary | ICD-10-CM

## 2020-08-15 DIAGNOSIS — R0602 Shortness of breath: Secondary | ICD-10-CM | POA: Diagnosis present

## 2020-08-15 DIAGNOSIS — E876 Hypokalemia: Secondary | ICD-10-CM

## 2020-08-15 DIAGNOSIS — F419 Anxiety disorder, unspecified: Secondary | ICD-10-CM

## 2020-08-15 DIAGNOSIS — E785 Hyperlipidemia, unspecified: Secondary | ICD-10-CM

## 2020-08-15 DIAGNOSIS — I10 Essential (primary) hypertension: Secondary | ICD-10-CM

## 2020-08-15 DIAGNOSIS — F17210 Nicotine dependence, cigarettes, uncomplicated: Secondary | ICD-10-CM | POA: Diagnosis present

## 2020-08-15 DIAGNOSIS — E119 Type 2 diabetes mellitus without complications: Secondary | ICD-10-CM

## 2020-08-15 DIAGNOSIS — I2510 Atherosclerotic heart disease of native coronary artery without angina pectoris: Secondary | ICD-10-CM

## 2020-08-15 DIAGNOSIS — Z79899 Other long term (current) drug therapy: Secondary | ICD-10-CM

## 2020-08-15 DIAGNOSIS — E871 Hypo-osmolality and hyponatremia: Secondary | ICD-10-CM | POA: Diagnosis present

## 2020-08-15 DIAGNOSIS — Z20822 Contact with and (suspected) exposure to covid-19: Secondary | ICD-10-CM | POA: Diagnosis present

## 2020-08-15 DIAGNOSIS — J441 Chronic obstructive pulmonary disease with (acute) exacerbation: Secondary | ICD-10-CM

## 2020-08-15 DIAGNOSIS — J9621 Acute and chronic respiratory failure with hypoxia: Secondary | ICD-10-CM | POA: Diagnosis present

## 2020-08-15 DIAGNOSIS — R7989 Other specified abnormal findings of blood chemistry: Secondary | ICD-10-CM

## 2020-08-15 DIAGNOSIS — N179 Acute kidney failure, unspecified: Secondary | ICD-10-CM

## 2020-08-15 DIAGNOSIS — D75839 Thrombocytosis, unspecified: Secondary | ICD-10-CM

## 2020-08-15 DIAGNOSIS — Z981 Arthrodesis status: Secondary | ICD-10-CM

## 2020-08-15 DIAGNOSIS — R0609 Other forms of dyspnea: Secondary | ICD-10-CM | POA: Diagnosis present

## 2020-08-15 DIAGNOSIS — E860 Dehydration: Secondary | ICD-10-CM

## 2020-08-15 DIAGNOSIS — D72829 Elevated white blood cell count, unspecified: Secondary | ICD-10-CM

## 2020-08-15 DIAGNOSIS — R0600 Dyspnea, unspecified: Secondary | ICD-10-CM | POA: Diagnosis present

## 2020-08-15 DIAGNOSIS — Z7952 Long term (current) use of systemic steroids: Secondary | ICD-10-CM

## 2020-08-15 DIAGNOSIS — Z9981 Dependence on supplemental oxygen: Secondary | ICD-10-CM

## 2020-08-15 DIAGNOSIS — F319 Bipolar disorder, unspecified: Secondary | ICD-10-CM | POA: Diagnosis present

## 2020-08-15 DIAGNOSIS — Z7951 Long term (current) use of inhaled steroids: Secondary | ICD-10-CM

## 2020-08-15 LAB — CBC WITH DIFFERENTIAL/PLATELET
Abs Immature Granulocytes: 0.14 10*3/uL — ABNORMAL HIGH (ref 0.00–0.07)
Basophils Absolute: 0 10*3/uL (ref 0.0–0.1)
Basophils Relative: 0 %
Eosinophils Absolute: 0 10*3/uL (ref 0.0–0.5)
Eosinophils Relative: 0 %
HCT: 33.1 % — ABNORMAL LOW (ref 36.0–46.0)
Hemoglobin: 10.6 g/dL — ABNORMAL LOW (ref 12.0–15.0)
Immature Granulocytes: 1 %
Lymphocytes Relative: 6 %
Lymphs Abs: 0.8 10*3/uL (ref 0.7–4.0)
MCH: 26.2 pg (ref 26.0–34.0)
MCHC: 32 g/dL (ref 30.0–36.0)
MCV: 81.7 fL (ref 80.0–100.0)
Monocytes Absolute: 0.5 10*3/uL (ref 0.1–1.0)
Monocytes Relative: 4 %
Neutro Abs: 12.2 10*3/uL — ABNORMAL HIGH (ref 1.7–7.7)
Neutrophils Relative %: 89 %
Platelets: 552 10*3/uL — ABNORMAL HIGH (ref 150–400)
RBC: 4.05 MIL/uL (ref 3.87–5.11)
RDW: 17 % — ABNORMAL HIGH (ref 11.5–15.5)
WBC: 13.7 10*3/uL — ABNORMAL HIGH (ref 4.0–10.5)
nRBC: 0 % (ref 0.0–0.2)

## 2020-08-15 LAB — BASIC METABOLIC PANEL
Anion gap: 19 — ABNORMAL HIGH (ref 5–15)
BUN: 41 mg/dL — ABNORMAL HIGH (ref 8–23)
CO2: 19 mmol/L — ABNORMAL LOW (ref 22–32)
Calcium: 9.8 mg/dL (ref 8.9–10.3)
Chloride: 95 mmol/L — ABNORMAL LOW (ref 98–111)
Creatinine, Ser: 1.7 mg/dL — ABNORMAL HIGH (ref 0.44–1.00)
GFR, Estimated: 32 mL/min — ABNORMAL LOW (ref >60)
Glucose, Bld: 119 mg/dL — ABNORMAL HIGH (ref 70–99)
Potassium: 3.4 mmol/L — ABNORMAL LOW (ref 3.5–5.1)
Sodium: 133 mmol/L — ABNORMAL LOW (ref 135–145)

## 2020-08-15 MED ORDER — PREDNISONE 10 MG PO TABS: 40.0000 mg | ORAL_TABLET | Freq: Every day | ORAL | 0 refills | Status: DC

## 2020-08-15 MED ORDER — METHYLPREDNISOLONE SODIUM SUCC 125 MG IJ SOLR
125.0000 mg | Freq: Once | INTRAMUSCULAR | Status: AC
  Administered 2020-08-15: 125 mg via INTRAVENOUS
  Filled 2020-08-15: qty 2

## 2020-08-15 MED ORDER — SODIUM CHLORIDE 0.9 % IV BOLUS
500.0000 mL | Freq: Once | INTRAVENOUS | Status: AC
  Administered 2020-08-15: 500 mL via INTRAVENOUS

## 2020-08-15 MED ORDER — ALBUTEROL SULFATE HFA 108 (90 BASE) MCG/ACT IN AERS
2.0000 | INHALATION_SPRAY | Freq: Once | RESPIRATORY_TRACT | Status: AC
  Administered 2020-08-15: 2 via RESPIRATORY_TRACT
  Filled 2020-08-15: qty 6.7

## 2020-08-15 MED ORDER — SODIUM CHLORIDE 0.9 % IV SOLN: INTRAVENOUS | Status: DC

## 2020-08-15 NOTE — ED Provider Notes (Signed)
  Provider Note MRN:  [id number]  Arrival date & time: 08/16/20    ED Course and Medical Decision Making  Assumed care from Dr. Jodelle Gross at shift change.  Favoring COPD, did not fill her prednisone.  No acute distress, anticipating discharge after screening labs.  Labs reveal acute kidney injury, patient continues to be tachycardic, tachypneic.  I also favor a component of anxiety, PE thought to be unlikely but is a consideration.  Will hold off on CTA imaging given the AKI and low pretest probability, will request observation admission.  Procedures  Final Clinical Impressions(s) / ED Diagnoses     ICD-10-CM   1. COPD exacerbation (HCC)  J44.1   2. SOB (shortness of breath)  R06.02 DG Chest [facility] 1 View    DG Chest [place] 1 View  3. Anxiety  F41.9   4. AKI (acute kidney injury) (HCC)  N17.9         Elmer Sow. Pilar Plate, MD [facility] Emergency Medicine [facility] [email or web address]    Sabas Sous, MD 08/16/20 [phone]

## 2020-08-15 NOTE — ED Provider Notes (Signed)
[facility] EMERGENCY DEPARTMENT Provider Note   CSN: [id number] Arrival date & time: 08/15/20  2048     History Chief Complaint  Patient presents with  . Shortness of Breath    Candace Harris is a 70 y.o. female.  Patient brought in by EMS.  Complaining of shortness of breath for the past 3 weeks.  But getting worse here lately.  Saw her primary care doctor in [place].  He prescribed her with prednisone but she did not get it filled yet so has not taken any yet.  Patient states she is normally on 3 L to 6 L of oxygen sometimes higher at home.  Patient is also concerned that she is dehydrated and she is not getting enough to eat for the past few weeks because she does not have much of an appetite.  Patient is on oxygen at home all the time.  States she does have inhalers at home.  Past medical history sniffing for hypertension diabetes coronary disease bipolar affective disorder anxiety COPD and polymyalgia rheumatica.  Patient's oxygen sats on 2 L is actually 94%.  But she appears anxious and feels appears as if she is having difficulty breathing.        Past Medical History:  Diagnosis Date  . Anxiety   . Bipolar affective disorder (HCC)   . CAD (coronary artery disease)   . Chronic respiratory failure (HCC)   . COPD (chronic obstructive pulmonary disease) (HCC)   . DM (diabetes mellitus) (HCC)   . GERD (gastroesophageal reflux disease)   . HTN (hypertension)   . Polymyalgia rheumatica (HCC)     There are no problems to display for this patient.   Past Surgical History:  Procedure Laterality Date  . ANTERIOR FUSION CERVICAL SPINE  2012   C5-7     OB History   No obstetric history on file.     Family History  Problem Relation Age of Onset  . Hypertension Mother     Social History   Tobacco Use  . Smoking status: Current Every Day Smoker    Packs/day: 0.50    Years: 50.00    Pack years: 25.00    Types: Cigarettes  . Smokeless tobacco: Never Used  .  Tobacco comment: 5-10 cigarettes per day/// 11/24/19 [facility]    Home Medications Prior to Admission medications   Medication Sig Start Date End Date Taking? Authorizing Provider  albuterol (VENTOLIN HFA) 108 (90 Base) MCG/ACT inhaler INHALE 2 PUFFS EVERY 4 HOURS AS NEEDED FOR WHEEZING 07/30/16  Yes [provider]  amLODipine (NORVASC) 2.5 MG tablet TAKE 1 TABLET EVERY DAY 12/19/16  Yes [provider]  ARIPiprazole (ABILIFY) 5 MG tablet Take 5 mg by mouth daily. 04/06/20  Yes [provider]  cyclobenzaprine (FLEXERIL) 10 MG tablet Take 1 tablet by mouth 2 (two) times daily. 06/17/20  Yes [provider]  dicyclomine (BENTYL) 20 MG tablet Take 20 mg by mouth 4 (four) times daily. 06/17/20  Yes [provider]  DULoxetine (CYMBALTA) 30 MG capsule Take 30 mg by mouth daily. 03/13/20  Yes [provider]  HYDROcodone-acetaminophen (NORCO) 10-325 MG tablet Take 1 tablet by mouth 4 (four) times daily as needed. 11/09/19  Yes [provider]  Ibuprofen (ADVIL PO) Take 500 mg by mouth daily as needed.   Yes [provider]  levofloxacin (LEVAQUIN) 750 MG tablet Take 750 mg by mouth daily. 08/14/20  Yes [provider]  losartan (COZAAR) 50 MG tablet Take 50  mg by mouth daily. 07/12/20  Yes [provider]  metFORMIN (GLUCOPHAGE) 500 MG tablet TAKE 1 TABLET TWICE DAILY WITH MEALS 08/13/16  Yes [provider]  nitroGLYCERIN (NITROSTAT) 0.4 MG SL tablet Place 0.4 mg under the tongue. 06/17/20  Yes [provider]  omeprazole (PRILOSEC) 40 MG capsule Take 40 mg by mouth every morning. 11/09/19  Yes [provider]  ondansetron (ZOFRAN) 4 MG tablet Take 4 mg by mouth daily. 11/11/19  Yes [provider]  rosuvastatin (CRESTOR) 20 MG tablet Take 20 mg by mouth at bedtime. 06/12/20  Yes [provider]  TRELEGY ELLIPTA 100-62.5-25 MCG/INH AEPB Inhale 1 puff into the lungs daily. 11/09/19  Yes  [provider]  XTAMPZA ER 9 MG C12A Take 1 capsule by mouth 2 (two) times daily. 10/25/19  Yes [provider]  celecoxib (CELEBREX) 200 MG capsule Take 200 mg by mouth daily. Patient not taking: Reported on 08/15/2020 11/09/19   [provider]  predniSONE (DELTASONE) 20 MG tablet Take 20 mg by mouth 2 (two) times daily. 08/14/20   [provider]  sertraline (ZOLOFT) 100 MG tablet Take 100 mg by mouth daily. Patient not taking: Reported on 08/15/2020 06/17/20   [provider]    Allergies    Alendronate, Ibuprofen, Ramipril, and Sulfabenzamide  Review of Systems   Review of Systems  Constitutional: Positive for appetite change. Negative for chills and fever.  HENT: Negative for rhinorrhea and sore throat.   Eyes: Negative for visual disturbance.  Respiratory: Positive for shortness of breath. Negative for cough.   Cardiovascular: Negative for chest pain and leg swelling.  Gastrointestinal: Negative for abdominal pain, diarrhea, nausea and vomiting.  Genitourinary: Negative for dysuria.  Musculoskeletal: Negative for back pain and neck pain.  Skin: Negative for rash.  Neurological: Negative for dizziness, light-headedness and headaches.  Hematological: Does not bruise/bleed easily.  Psychiatric/Behavioral: Negative for confusion. The patient is nervous/anxious.     Physical Exam Updated Vital Signs BP (!) 126/93   Pulse (!) 106   Resp 18   Ht 1.575 m (5\' 2" )   Wt 60 kg   SpO2 100%   BMI 24.19 kg/m   Physical Exam Vitals and nursing note reviewed.  Constitutional:      General: She is not in acute distress.    Appearance: Normal appearance. She is well-developed and well-nourished.  HENT:     Head: Normocephalic and atraumatic.     Mouth/Throat:     Mouth: Mucous membranes are moist.  Eyes:     Extraocular Movements: Extraocular movements intact.     Conjunctiva/sclera: Conjunctivae normal.     Pupils: Pupils are equal, round,  and reactive to light.  Cardiovascular:     Rate and Rhythm: Normal rate and regular rhythm.     Heart sounds: No murmur heard.   Pulmonary:     Effort: Respiratory distress present.     Breath sounds: Normal breath sounds. No wheezing, rhonchi or rales.  Chest:     Chest wall: No tenderness.  Abdominal:     Palpations: Abdomen is soft.     Tenderness: There is no abdominal tenderness.  Musculoskeletal:        General: No swelling or edema. Normal range of motion.     Cervical back: Normal range of motion and neck supple.  Skin:    General: Skin is warm and dry.     Capillary Refill: Capillary refill takes less than 2 seconds.  Neurological:  General: No focal deficit present.     Mental Status: She is alert and oriented to person, place, and time.     Cranial Nerves: No cranial nerve deficit.     Sensory: No sensory deficit.     Motor: No weakness.  Psychiatric:        Mood and Affect: Mood and affect normal.     ED Results / Procedures / Treatments   Labs (all labs ordered are listed, but only abnormal results are displayed) Labs Reviewed  CBC WITH DIFFERENTIAL/PLATELET - Abnormal; Notable for the following components:      Result Value   WBC 13.7 (*)    Hemoglobin 10.6 (*)    HCT 33.1 (*)    RDW 17.0 (*)    Platelets 552 (*)    Neutro Abs 12.2 (*)    Abs Immature Granulocytes 0.14 (*)    All other components within normal limits  BASIC METABOLIC PANEL    EKG EKG Interpretation  Date/Time:  Tuesday August 15 2020 20:56:48 EST Ventricular Rate:  115 PR Interval:    QRS Duration: 77 QT Interval:  342 QTC Calculation: 473 R Axis:   53 Text Interpretation: Sinus tachycardia Atrial premature complex Nonspecific repol abnormality, diffuse leads Confirmed by Vanetta Mulders [phone]) on 08/15/2020 9:21:33 PM   Radiology DG Chest Port 1 View  Result Date: 08/15/2020 CLINICAL DATA:  Initial evaluation for acute shortness of breath. EXAM: PORTABLE CHEST 1 VIEW  COMPARISON:  Prior radiograph from 07/15/2020. FINDINGS: Transverse heart size within normal limits. Mediastinal silhouette normal. Chronic coarsening in attenuation of the pulmonary markings compatible with emphysema. No focal infiltrates. No pulmonary edema or pleural effusion. No pneumothorax. Nodular density overlying the mid left chest favored to be related to overlying EKG lead. No acute osseous finding.  Cervical ACDF noted. IMPRESSION: 1. No active cardiopulmonary disease. 2. Emphysema. Electronically Signed   By: Rise Mu M.D.   On: 08/15/2020 21:36    Procedures Procedures   Medications Ordered in ED Medications  0.9 %  sodium chloride infusion (has no administration in time range)  sodium chloride 0.9 % bolus 500 mL (500 mLs Intravenous New Bag/Given 08/15/20 2240)  albuterol (VENTOLIN HFA) 108 (90 Base) MCG/ACT inhaler 2 puff (2 puffs Inhalation Given 08/15/20 2238)  methylPREDNISolone sodium succinate (SOLU-MEDROL) 125 mg/2 mL injection 125 mg (125 mg Intravenous Given 08/15/20 2238)    ED Course  I have reviewed the triage vital signs and the nursing notes.  Pertinent labs & imaging results that were available during my care of the patient were reviewed by me and considered in my medical decision making (see chart for details).    MDM Rules/Calculators/A&P                          Patient's mucous membranes are moist.  May be slightly dry.  Respiratory rate is but there is no wheezing.  And her oxygen saturations on just 2 L are very good at 94%.  She is a little tachycardic.  No leg swelling.  Does appear to be anxious.  I think a lot of her component is history of COPD although no wheezing.  And has a history of anxiety.  Patient will be given some IV fluids here will check CBC basic metabolic panel.  Given 2 puffs of an albuterol inhaler which she can also take at home just in case she does not have one at home.  And given 125  mg of Solu-Medrol IV.  Labs pending.   Chest x-ray without any acute abnormalities is consistent with emphysema.  Patient is oxygen saturation seems good.  If patient is labs without any significant abnormalities will continue with prednisone at home.  For the next 5 days.  Have her use albuterol inhaler 2 puffs.  Will Goeden give another prescription prednisone just in case she does not really have the prescription for prednisone that her primary care provider provided.    Final Clinical Impression(s) / ED Diagnoses Final diagnoses:  SOB (shortness of breath)    Rx / DC Orders ED Discharge Orders    None       Vanetta Mulders, MD 08/15/20 2315

## 2020-08-15 NOTE — ED Triage Notes (Signed)
Pt arrives via [facility] c/o [facility] for 3 wks. Hx of COPD. Son told EMS he was concerned that pt wasn't eating enough food for the past week. Pt wear O2 chronic at home, EMS was told between 3L and 6L.

## 2020-08-16 ENCOUNTER — Observation Stay (HOSPITAL_COMMUNITY): Payer: Medicare HMO

## 2020-08-16 ENCOUNTER — Encounter (HOSPITAL_COMMUNITY): Payer: Self-pay | Admitting: Internal Medicine

## 2020-08-16 ENCOUNTER — Other Ambulatory Visit: Payer: Self-pay

## 2020-08-16 DIAGNOSIS — K219 Gastro-esophageal reflux disease without esophagitis: Secondary | ICD-10-CM | POA: Diagnosis present

## 2020-08-16 DIAGNOSIS — R0602 Shortness of breath: Secondary | ICD-10-CM | POA: Diagnosis present

## 2020-08-16 DIAGNOSIS — F319 Bipolar disorder, unspecified: Secondary | ICD-10-CM | POA: Diagnosis present

## 2020-08-16 DIAGNOSIS — R0600 Dyspnea, unspecified: Secondary | ICD-10-CM | POA: Diagnosis present

## 2020-08-16 DIAGNOSIS — D75839 Thrombocytosis, unspecified: Secondary | ICD-10-CM | POA: Diagnosis present

## 2020-08-16 DIAGNOSIS — D72829 Elevated white blood cell count, unspecified: Secondary | ICD-10-CM

## 2020-08-16 DIAGNOSIS — E860 Dehydration: Secondary | ICD-10-CM | POA: Diagnosis present

## 2020-08-16 DIAGNOSIS — N179 Acute kidney failure, unspecified: Secondary | ICD-10-CM

## 2020-08-16 DIAGNOSIS — I10 Essential (primary) hypertension: Secondary | ICD-10-CM

## 2020-08-16 DIAGNOSIS — Z981 Arthrodesis status: Secondary | ICD-10-CM | POA: Diagnosis not present

## 2020-08-16 DIAGNOSIS — E876 Hypokalemia: Secondary | ICD-10-CM | POA: Diagnosis present

## 2020-08-16 DIAGNOSIS — F419 Anxiety disorder, unspecified: Secondary | ICD-10-CM | POA: Diagnosis present

## 2020-08-16 DIAGNOSIS — J441 Chronic obstructive pulmonary disease with (acute) exacerbation: Secondary | ICD-10-CM

## 2020-08-16 DIAGNOSIS — E119 Type 2 diabetes mellitus without complications: Secondary | ICD-10-CM | POA: Diagnosis present

## 2020-08-16 DIAGNOSIS — I2510 Atherosclerotic heart disease of native coronary artery without angina pectoris: Secondary | ICD-10-CM | POA: Diagnosis present

## 2020-08-16 DIAGNOSIS — R0609 Other forms of dyspnea: Secondary | ICD-10-CM | POA: Diagnosis present

## 2020-08-16 DIAGNOSIS — Z8249 Family history of ischemic heart disease and other diseases of the circulatory system: Secondary | ICD-10-CM | POA: Diagnosis not present

## 2020-08-16 DIAGNOSIS — Z7952 Long term (current) use of systemic steroids: Secondary | ICD-10-CM | POA: Diagnosis not present

## 2020-08-16 DIAGNOSIS — Z20822 Contact with and (suspected) exposure to covid-19: Secondary | ICD-10-CM | POA: Diagnosis present

## 2020-08-16 DIAGNOSIS — J439 Emphysema, unspecified: Secondary | ICD-10-CM | POA: Diagnosis present

## 2020-08-16 DIAGNOSIS — R7989 Other specified abnormal findings of blood chemistry: Secondary | ICD-10-CM

## 2020-08-16 DIAGNOSIS — E785 Hyperlipidemia, unspecified: Secondary | ICD-10-CM

## 2020-08-16 DIAGNOSIS — Z79899 Other long term (current) drug therapy: Secondary | ICD-10-CM | POA: Diagnosis not present

## 2020-08-16 DIAGNOSIS — F17210 Nicotine dependence, cigarettes, uncomplicated: Secondary | ICD-10-CM | POA: Diagnosis present

## 2020-08-16 DIAGNOSIS — J9621 Acute and chronic respiratory failure with hypoxia: Secondary | ICD-10-CM | POA: Diagnosis present

## 2020-08-16 DIAGNOSIS — E871 Hypo-osmolality and hyponatremia: Secondary | ICD-10-CM | POA: Diagnosis present

## 2020-08-16 DIAGNOSIS — Z7951 Long term (current) use of inhaled steroids: Secondary | ICD-10-CM | POA: Diagnosis not present

## 2020-08-16 DIAGNOSIS — Z9981 Dependence on supplemental oxygen: Secondary | ICD-10-CM | POA: Diagnosis not present

## 2020-08-16 LAB — PHOSPHORUS: Phosphorus: 3.5 mg/dL (ref 2.5–4.6)

## 2020-08-16 LAB — CBC
HCT: 30 % — ABNORMAL LOW (ref 36.0–46.0)
Hemoglobin: 9.5 g/dL — ABNORMAL LOW (ref 12.0–15.0)
MCH: 26.1 pg (ref 26.0–34.0)
MCHC: 31.7 g/dL (ref 30.0–36.0)
MCV: 82.4 fL (ref 80.0–100.0)
Platelets: 473 10*3/uL — ABNORMAL HIGH (ref 150–400)
RBC: 3.64 MIL/uL — ABNORMAL LOW (ref 3.87–5.11)
RDW: 17.1 % — ABNORMAL HIGH (ref 11.5–15.5)
WBC: 12.9 10*3/uL — ABNORMAL HIGH (ref 4.0–10.5)
nRBC: 0 % (ref 0.0–0.2)

## 2020-08-16 LAB — GLUCOSE, CAPILLARY
Glucose-Capillary: 106 mg/dL — ABNORMAL HIGH (ref 70–99)
Glucose-Capillary: 135 mg/dL — ABNORMAL HIGH (ref 70–99)
Glucose-Capillary: 152 mg/dL — ABNORMAL HIGH (ref 70–99)
Glucose-Capillary: 153 mg/dL — ABNORMAL HIGH (ref 70–99)

## 2020-08-16 LAB — COMPREHENSIVE METABOLIC PANEL
ALT: 16 U/L (ref 0–44)
AST: 30 U/L (ref 15–41)
Albumin: 3.4 g/dL — ABNORMAL LOW (ref 3.5–5.0)
Alkaline Phosphatase: 72 U/L (ref 38–126)
Anion gap: 14 (ref 5–15)
BUN: 36 mg/dL — ABNORMAL HIGH (ref 8–23)
CO2: 21 mmol/L — ABNORMAL LOW (ref 22–32)
Calcium: 9.6 mg/dL (ref 8.9–10.3)
Chloride: 101 mmol/L (ref 98–111)
Creatinine, Ser: 1.29 mg/dL — ABNORMAL HIGH (ref 0.44–1.00)
GFR, Estimated: 45 mL/min — ABNORMAL LOW (ref >60)
Glucose, Bld: 151 mg/dL — ABNORMAL HIGH (ref 70–99)
Potassium: 4.4 mmol/L (ref 3.5–5.1)
Sodium: 136 mmol/L (ref 135–145)
Total Bilirubin: 0.5 mg/dL (ref 0.3–1.2)
Total Protein: 7.7 g/dL (ref 6.5–8.1)

## 2020-08-16 LAB — HEMOGLOBIN A1C
Hgb A1c MFr Bld: 6.7 % — ABNORMAL HIGH (ref 4.8–5.6)
Mean Plasma Glucose: 145.59 mg/dL

## 2020-08-16 LAB — PROTIME-INR
INR: 1.6 — ABNORMAL HIGH (ref 0.8–1.2)
Prothrombin Time: 18 seconds — ABNORMAL HIGH (ref 11.4–15.2)

## 2020-08-16 LAB — SARS CORONAVIRUS 2 BY RT PCR (HOSPITAL ORDER, PERFORMED IN ~~LOC~~ HOSPITAL LAB): SARS Coronavirus 2: NEGATIVE

## 2020-08-16 LAB — HIV ANTIBODY (ROUTINE TESTING W REFLEX): HIV Screen 4th Generation wRfx: NONREACTIVE

## 2020-08-16 LAB — MAGNESIUM: Magnesium: 1.6 mg/dL — ABNORMAL LOW (ref 1.7–2.4)

## 2020-08-16 LAB — APTT: aPTT: 35 seconds (ref 24–36)

## 2020-08-16 LAB — D-DIMER, QUANTITATIVE: D-Dimer, Quant: 0.78 ug/mL-FEU — ABNORMAL HIGH (ref 0.00–0.50)

## 2020-08-16 MED ORDER — METHYLPREDNISOLONE SODIUM SUCC 40 MG IJ SOLR
40.0000 mg | Freq: Two times a day (BID) | INTRAMUSCULAR | Status: DC
  Administered 2020-08-16 – 2020-08-18 (×5): 40 mg via INTRAVENOUS
  Filled 2020-08-16 (×5): qty 1

## 2020-08-16 MED ORDER — POTASSIUM CHLORIDE 10 MEQ/100ML IV SOLN
10.0000 meq | INTRAVENOUS | Status: AC
  Administered 2020-08-16 (×2): 10 meq via INTRAVENOUS
  Filled 2020-08-16 (×2): qty 100

## 2020-08-16 MED ORDER — GUAIFENESIN-DM 100-10 MG/5ML PO SYRP: 5.0000 mL | ORAL_SOLUTION | ORAL | Status: DC | PRN

## 2020-08-16 MED ORDER — LORAZEPAM 1 MG PO TABS
1.0000 mg | ORAL_TABLET | Freq: Once | ORAL | Status: AC
  Administered 2020-08-16: 1 mg via ORAL
  Filled 2020-08-16: qty 1

## 2020-08-16 MED ORDER — PANTOPRAZOLE SODIUM 40 MG PO TBEC
40.0000 mg | DELAYED_RELEASE_TABLET | Freq: Every day | ORAL | Status: DC
  Administered 2020-08-16 – 2020-08-18 (×3): 40 mg via ORAL
  Filled 2020-08-16 (×3): qty 1

## 2020-08-16 MED ORDER — ROSUVASTATIN CALCIUM 20 MG PO TABS
20.0000 mg | ORAL_TABLET | Freq: Every day | ORAL | Status: DC
  Administered 2020-08-16 – 2020-08-17 (×2): 20 mg via ORAL
  Filled 2020-08-16 (×2): qty 1

## 2020-08-16 MED ORDER — DM-GUAIFENESIN ER 30-600 MG PO TB12
1.0000 | ORAL_TABLET | Freq: Two times a day (BID) | ORAL | Status: DC
  Administered 2020-08-16 – 2020-08-18 (×5): 1 via ORAL
  Filled 2020-08-16 (×5): qty 1

## 2020-08-16 MED ORDER — UMECLIDINIUM BROMIDE 62.5 MCG/INH IN AEPB
1.0000 | INHALATION_SPRAY | Freq: Every day | RESPIRATORY_TRACT | Status: DC
  Administered 2020-08-17 – 2020-08-18 (×2): 1 via RESPIRATORY_TRACT
  Filled 2020-08-16: qty 7

## 2020-08-16 MED ORDER — HYDROCOD POLST-CPM POLST ER 10-8 MG/5ML PO SUER: 5.0000 mL | Freq: Two times a day (BID) | ORAL | Status: DC | PRN

## 2020-08-16 MED ORDER — AZITHROMYCIN 250 MG PO TABS
250.0000 mg | ORAL_TABLET | Freq: Every day | ORAL | Status: DC
  Administered 2020-08-17 – 2020-08-18 (×2): 250 mg via ORAL
  Filled 2020-08-16 (×2): qty 1

## 2020-08-16 MED ORDER — FLUTICASONE FUROATE-VILANTEROL 100-25 MCG/INH IN AEPB
1.0000 | INHALATION_SPRAY | Freq: Every day | RESPIRATORY_TRACT | Status: DC
  Administered 2020-08-17 – 2020-08-18 (×2): 1 via RESPIRATORY_TRACT
  Filled 2020-08-16: qty 28

## 2020-08-16 MED ORDER — SODIUM CHLORIDE 0.9 % IV SOLN: Freq: Once | INTRAVENOUS | Status: AC

## 2020-08-16 MED ORDER — FLUTICASONE-UMECLIDIN-VILANT 100-62.5-25 MCG/INH IN AEPB: 1.0000 | INHALATION_SPRAY | Freq: Every day | RESPIRATORY_TRACT | Status: DC

## 2020-08-16 MED ORDER — AMLODIPINE BESYLATE 5 MG PO TABS
2.5000 mg | ORAL_TABLET | Freq: Every day | ORAL | Status: DC
  Administered 2020-08-16 – 2020-08-18 (×3): 2.5 mg via ORAL
  Filled 2020-08-16 (×3): qty 1

## 2020-08-16 MED ORDER — TECHNETIUM TO 99M ALBUMIN AGGREGATED
4.0000 | Freq: Once | INTRAVENOUS | Status: AC | PRN
  Administered 2020-08-16: 4 via INTRAVENOUS

## 2020-08-16 MED ORDER — ENOXAPARIN SODIUM 30 MG/0.3ML ~~LOC~~ SOLN: 30.0000 mg | SUBCUTANEOUS | Status: DC

## 2020-08-16 MED ORDER — INSULIN ASPART 100 UNIT/ML ~~LOC~~ SOLN
0.0000 [IU] | Freq: Three times a day (TID) | SUBCUTANEOUS | Status: DC
  Administered 2020-08-16: 1 [IU] via SUBCUTANEOUS
  Administered 2020-08-16: 2 [IU] via SUBCUTANEOUS
  Administered 2020-08-17 – 2020-08-18 (×3): 1 [IU] via SUBCUTANEOUS

## 2020-08-16 MED ORDER — ALBUTEROL SULFATE HFA 108 (90 BASE) MCG/ACT IN AERS
2.0000 | INHALATION_SPRAY | Freq: Four times a day (QID) | RESPIRATORY_TRACT | Status: DC
  Administered 2020-08-16 – 2020-08-17 (×7): 2 via RESPIRATORY_TRACT
  Filled 2020-08-16: qty 6.7

## 2020-08-16 MED ORDER — INSULIN ASPART 100 UNIT/ML ~~LOC~~ SOLN: 0.0000 [IU] | Freq: Every day | SUBCUTANEOUS | Status: DC

## 2020-08-16 MED ORDER — AZITHROMYCIN 250 MG PO TABS
500.0000 mg | ORAL_TABLET | Freq: Every day | ORAL | Status: AC
  Administered 2020-08-16: 500 mg via ORAL
  Filled 2020-08-16: qty 2

## 2020-08-16 MED ORDER — ENOXAPARIN SODIUM 40 MG/0.4ML ~~LOC~~ SOLN
40.0000 mg | SUBCUTANEOUS | Status: DC
  Administered 2020-08-16 – 2020-08-18 (×3): 40 mg via SUBCUTANEOUS
  Filled 2020-08-16 (×3): qty 0.4

## 2020-08-16 NOTE — Plan of Care (Signed)
  Problem: Clinical Measurements: Goal: Respiratory complications will improve Outcome: Progressing   Problem: Nutrition: Goal: Adequate nutrition will be maintained Outcome: Progressing   

## 2020-08-16 NOTE — H&P (Signed)
History and Physical  Candace Harris VHQ:[id number] DOB: [birth year] DOA: 08/15/2020  Referring physician: Sabas Sous, MD PCP: Candace Coop, FNP  Patient coming from: Home  Chief Complaint: Shortness of breath   HPI: Candace Harris is a 70 y.o. female with medical history significant for HTN, CAD, anxiety, COPD (on 3-6 L of O2 at home), T2DM, GERD and bipolar disorder who presents to the emergency department due to 10-month onset of shortness of breath that has been progressively worsening, she complained of decreased oral intake due to loss of taste, she also endorsed loss of smell and she states that she had diarrhea for 2 to 3 weeks last month which has resolved.  She states that she saw her PCP in [place] who prescribed prednisone for her, but she has not yet filled the prescription.  She decided to go to the ED for further evaluation due to persistent worsening shortness of breath and concern for dehydration.  ED Course: In the emergency department, she was tachycardic and intermittently tachypneic. Work-up in the ED showed leukocytosis, thrombocytosis, hypokalemia, hyponatremia, BUN/creatinine 41/1.70 (baseline creatinine was 0.7). D-dimer 0.78. Chest x-ray was reflexive of emphysema but showed no active cardiopulmonary disease Breathing treatment was provided, Solu-Medrol 125 Mg x1 was given, IV Ativan Librium x1 due to anxiety was given and patient was provided with 500 mL of IV NS. Hospitalist was asked to admit patient for further evaluation and management.  Review of Systems: Constitutional: Positive for appetite change.  Negative for chills and fever.  HENT: Negative for ear pain and sore throat.   Eyes: Negative for pain and visual disturbance.  Respiratory: Positive for cough and shortness of breath.   Cardiovascular: Negative for chest pain and palpitations.  Gastrointestinal: Negative for abdominal pain and vomiting.  Endocrine: Negative for polyphagia and polyuria.   Genitourinary: Negative for decreased urine volume, dysuria Musculoskeletal: Negative for arthralgias and back pain.  Skin: Negative for color change and rash.  Allergic/Immunologic: Negative for immunocompromised state.  Neurological: Negative for tremors, syncope, speech difficulty and headaches.  Hematological: Does not bruise/bleed easily.  Psychiatric: Positive for anxiety All other systems reviewed and are negative  Past Medical History:  Diagnosis Date  . Anxiety   . Bipolar affective disorder (HCC)   . CAD (coronary artery disease)   . Chronic respiratory failure (HCC)   . COPD (chronic obstructive pulmonary disease) (HCC)   . DM (diabetes mellitus) (HCC)   . GERD (gastroesophageal reflux disease)   . HTN (hypertension)   . Polymyalgia rheumatica (HCC)    Past Surgical History:  Procedure Laterality Date  . ANTERIOR FUSION CERVICAL SPINE  2012   C5-7    Social History:  reports that she has been smoking cigarettes. She has a 25.00 pack-year smoking history. She has never used smokeless tobacco. No history on file for alcohol use and drug use.   Allergies  Allergen Reactions  . Alendronate Other (See Comments)    Sore muscles, burning in chest Sore muscles, burning in chest   . Ibuprofen Nausea And Vomiting    Other reaction(s): Unknown   . Ramipril Nausea And Vomiting and Other (See Comments)  . Sulfabenzamide Other (See Comments)    HEADACHE     Family History  Problem Relation Age of Onset  . Hypertension Mother      Prior to Admission medications   Medication Sig Start Date End Date Taking? Authorizing Provider  albuterol (VENTOLIN HFA) 108 (90 Base) MCG/ACT inhaler INHALE 2 PUFFS EVERY  4 HOURS AS NEEDED FOR WHEEZING 07/30/16  Yes [provider]  amLODipine (NORVASC) 2.5 MG tablet TAKE 1 TABLET EVERY DAY 12/19/16  Yes [provider]  ARIPiprazole (ABILIFY) 5 MG tablet Take 5 mg by mouth daily. 04/06/20  Yes [provider]   cyclobenzaprine (FLEXERIL) 10 MG tablet Take 1 tablet by mouth 2 (two) times daily. 06/17/20  Yes [provider]  dicyclomine (BENTYL) 20 MG tablet Take 20 mg by mouth 4 (four) times daily. 06/17/20  Yes [provider]  DULoxetine (CYMBALTA) 30 MG capsule Take 30 mg by mouth daily. 03/13/20  Yes [provider]  HYDROcodone-acetaminophen (NORCO) 10-325 MG tablet Take 1 tablet by mouth 4 (four) times daily as needed. 11/09/19  Yes [provider]  Ibuprofen (ADVIL PO) Take 500 mg by mouth daily as needed.   Yes [provider]  levofloxacin (LEVAQUIN) 750 MG tablet Take 750 mg by mouth daily. 08/14/20  Yes [provider]  losartan (COZAAR) 50 MG tablet Take 50 mg by mouth daily. 07/12/20  Yes [provider]  metFORMIN (GLUCOPHAGE) 500 MG tablet TAKE 1 TABLET TWICE DAILY WITH MEALS 08/13/16  Yes [provider]  nitroGLYCERIN (NITROSTAT) 0.4 MG SL tablet Place 0.4 mg under the tongue. 06/17/20  Yes [provider]  omeprazole (PRILOSEC) 40 MG capsule Take 40 mg by mouth every morning. 11/09/19  Yes [provider]  ondansetron (ZOFRAN) 4 MG tablet Take 4 mg by mouth daily. 11/11/19  Yes [provider]  predniSONE (DELTASONE) 10 MG tablet Take 4 tablets (40 mg total) by mouth daily. 08/15/20  Yes Vanetta Mulders, MD  rosuvastatin (CRESTOR) 20 MG tablet Take 20 mg by mouth at bedtime. 06/12/20  Yes [provider]  TRELEGY ELLIPTA 100-62.5-25 MCG/INH AEPB Inhale 1 puff into the lungs daily. 11/09/19  Yes [provider]  XTAMPZA ER 9 MG C12A Take 1 capsule by mouth 2 (two) times daily. 10/25/19  Yes [provider]  celecoxib (CELEBREX) 200 MG capsule Take 200 mg by mouth daily. Patient not taking: Reported on 08/15/2020 11/09/19   [provider]  predniSONE (DELTASONE) 20 MG tablet Take 20 mg by mouth 2 (two) times daily. 08/14/20   [provider]  sertraline  (ZOLOFT) 100 MG tablet Take 100 mg by mouth daily. Patient not taking: Reported on 08/15/2020 06/17/20   [provider]    Physical Exam: BP (!) 114/104   Pulse (!) 110   Resp 11   Ht 5\' 2"  (1.575 m)   Wt 60 kg   SpO2 100%   BMI 24.19 kg/m   . General: 70 y.o. year-old female ill appearing and in mild acute distress.  Alert and oriented x3. Marland Kitchen HEENT: Dry mucous membrane.  NCAT, EOMI . Neck: Supple, trachea medial . Cardiovascular: Tachycardia.  Regular rate and rhythm with no rubs or gallops.  No thyromegaly or JVD noted.  2/4 pulses in all 4 extremities. Marland Kitchen Respiratory: Mild scattered expiratory wheezes.  No rales.  . Abdomen: Soft nontender nondistended with normal bowel sounds x4 quadrants. . Muskuloskeletal: No cyanosis, clubbing or edema noted bilaterally . Neuro: CN II-XII intact, sensation, reflexes intact . Skin: No ulcerative lesions noted or rashes . Psychiatry: Judgement and insight appear normal. Mood is appropriate for condition and setting          Labs on Admission:  Basic Metabolic Panel: Recent Labs  Lab 08/15/20 2234  NA 133*  K 3.4*  CL 95*  CO2  19*  GLUCOSE 119*  BUN 41*  CREATININE 1.70*  CALCIUM 9.8   Liver Function Tests: No results for input(s): AST, ALT, ALKPHOS, BILITOT, PROT, ALBUMIN in the last 168 hours. No results for input(s): LIPASE, AMYLASE in the last 168 hours. No results for input(s): AMMONIA in the last 168 hours. CBC: Recent Labs  Lab 08/15/20 2234  WBC 13.7*  NEUTROABS 12.2*  HGB 10.6*  HCT 33.1*  MCV 81.7  PLT 552*   Cardiac Enzymes: No results for input(s): CKTOTAL, CKMB, CKMBINDEX, TROPONINI in the last 168 hours.  BNP (last 3 results) No results for input(s): BNP in the last 8760 hours.  ProBNP (last 3 results) No results for input(s): PROBNP in the last 8760 hours.  CBG: No results for input(s): GLUCAP in the last 168 hours.  Radiological Exams on Admission: DG Chest Port 1 View  Result Date:  08/15/2020 CLINICAL DATA:  Initial evaluation for acute shortness of breath. EXAM: PORTABLE CHEST 1 VIEW COMPARISON:  Prior radiograph from 07/15/2020. FINDINGS: Transverse heart size within normal limits. Mediastinal silhouette normal. Chronic coarsening in attenuation of the pulmonary markings compatible with emphysema. No focal infiltrates. No pulmonary edema or pleural effusion. No pneumothorax. Nodular density overlying the mid left chest favored to be related to overlying EKG lead. No acute osseous finding.  Cervical ACDF noted. IMPRESSION: 1. No active cardiopulmonary disease. 2. Emphysema. Electronically Signed   By: Rise Mu M.D.   On: 08/15/2020 21:36    EKG: I independently viewed the EKG done and my findings are as followed: Sinus tachycardia at a rate of 115 bpm  Assessment/Plan Present on Admission: . Shortness of breath  Principal Problem:   Shortness of breath Active Problems:   AKI (acute kidney injury) (HCC)   Dehydration   Leukocytosis   Thrombocytosis   Essential hypertension   Elevated d-dimer   Hypokalemia   Anxiety   COPD with acute exacerbation (HCC)   Hyperlipidemia   Diabetes mellitus (HCC)   GERD (gastroesophageal reflux disease)   CAD (coronary artery disease)  Shortness of breath possibly secondary to multifactorial with superimposed mild COPD exacerbation, r/o COVID-19 virus infection, pulmonary embolism Elevated D-dimer Patient complaining of loss of taste, smell, diarrhea and worsening shortness of breath, last SARS coronavirus done on January 1 was negative, repeat SARS coronavirus 2 pending Continue albuterol, Mucinex, Solu-Medrol, azithromycin. Continue Robitussin, Tussionex Continue Protonix to prevent steroid-induced ulcer Continue incentive spirometry and flutter valve Continue supplemental oxygen to maintain O2 sat > 92% with plan to wean patient off oxygen as tolerated D-dimer was elevated at 0.78, CT scan of chest cannot be done  due to acute kidney injury.  VQ scan will be done in the morning.  Leukocytosis/thrombocytosis possibly reactive WBC 13.7, platelets 552 Continue to monitor CBC with morning labs  Hypokalemia K+ 3.4, replenished  Acute kidney injury Dehydration BUN/creatinine 41/1.70 (baseline creatinine was 0.7).  Continue IV hydration Renally adjust medications, avoid nephrotoxic agents/dehydration/hypotension  Essential hypertension Continue Norvasc  Hyperlipidemia Continue Crestor  GERD Continue Protonix  T2DM Continue ISS and hypoglycemic protocol Metformin will be held at this time  CAD-stable   DVT prophylaxis: Lovenox  Code Status: Full code  Family Communication: None at bedside  Disposition Plan:  Patient is from:                        home Anticipated DC to:  SNF or family members home Anticipated DC date:               2-3 days Anticipated DC barriers:           Inpatient management required at this time due to worsening shortness of breath requiring further work-up   Consults called: None  Admission status: Observation    Frankey Shown MD [facility]  08/16/2020, 2:38 AM

## 2020-08-17 DIAGNOSIS — R0602 Shortness of breath: Secondary | ICD-10-CM

## 2020-08-17 DIAGNOSIS — E860 Dehydration: Secondary | ICD-10-CM

## 2020-08-17 DIAGNOSIS — N179 Acute kidney failure, unspecified: Secondary | ICD-10-CM

## 2020-08-17 DIAGNOSIS — J441 Chronic obstructive pulmonary disease with (acute) exacerbation: Secondary | ICD-10-CM

## 2020-08-17 DIAGNOSIS — K219 Gastro-esophageal reflux disease without esophagitis: Secondary | ICD-10-CM

## 2020-08-17 DIAGNOSIS — I10 Essential (primary) hypertension: Secondary | ICD-10-CM

## 2020-08-17 LAB — BASIC METABOLIC PANEL
Anion gap: 8 (ref 5–15)
BUN: 16 mg/dL (ref 8–23)
CO2: 19 mmol/L — ABNORMAL LOW (ref 22–32)
Calcium: 8.8 mg/dL — ABNORMAL LOW (ref 8.9–10.3)
Chloride: 112 mmol/L — ABNORMAL HIGH (ref 98–111)
Creatinine, Ser: 0.72 mg/dL (ref 0.44–1.00)
GFR, Estimated: >60 mL/min (ref >60)
Glucose, Bld: 166 mg/dL — ABNORMAL HIGH (ref 70–99)
Potassium: 3.4 mmol/L — ABNORMAL LOW (ref 3.5–5.1)
Sodium: 139 mmol/L (ref 135–145)

## 2020-08-17 LAB — GLUCOSE, CAPILLARY
Glucose-Capillary: 134 mg/dL — ABNORMAL HIGH (ref 70–99)
Glucose-Capillary: 127 mg/dL — ABNORMAL HIGH (ref 70–99)
Glucose-Capillary: 106 mg/dL — ABNORMAL HIGH (ref 70–99)
Glucose-Capillary: 187 mg/dL — ABNORMAL HIGH (ref 70–99)

## 2020-08-17 LAB — MAGNESIUM: Magnesium: 1.3 mg/dL — ABNORMAL LOW (ref 1.7–2.4)

## 2020-08-17 MED ORDER — SODIUM CHLORIDE 0.9 % IV SOLN: INTRAVENOUS | Status: DC

## 2020-08-17 MED ORDER — ALBUTEROL SULFATE HFA 108 (90 BASE) MCG/ACT IN AERS
2.0000 | INHALATION_SPRAY | Freq: Three times a day (TID) | RESPIRATORY_TRACT | Status: DC
  Administered 2020-08-18: 2 via RESPIRATORY_TRACT

## 2020-08-17 MED ORDER — MAGNESIUM SULFATE 4 GM/100ML IV SOLN
4.0000 g | Freq: Once | INTRAVENOUS | Status: AC
  Administered 2020-08-17: 4 g via INTRAVENOUS
  Filled 2020-08-17: qty 100

## 2020-08-17 MED ORDER — POTASSIUM CHLORIDE CRYS ER 20 MEQ PO TBCR
40.0000 meq | EXTENDED_RELEASE_TABLET | Freq: Once | ORAL | Status: AC
  Administered 2020-08-17: 40 meq via ORAL
  Filled 2020-08-17: qty 2

## 2020-08-17 MED ORDER — ALBUTEROL SULFATE HFA 108 (90 BASE) MCG/ACT IN AERS: 2.0000 | INHALATION_SPRAY | RESPIRATORY_TRACT | Status: DC | PRN

## 2020-08-17 NOTE — Plan of Care (Signed)
  Problem: Acute Rehab PT Goals(only PT should resolve) Goal: Pt Will Go Supine/Side To Sit Outcome: Progressing Flowsheets (Taken 08/17/2020 1444) Pt will go Supine/Side to Sit: with supervision Goal: Pt Will Go Sit To Supine/Side Outcome: Progressing Flowsheets (Taken 08/17/2020 1444) Pt will go Sit to Supine/Side: with supervision Goal: Patient Will Transfer Sit To/From Stand Outcome: Progressing Flowsheets (Taken 08/17/2020 1444) Patient will transfer sit to/from stand: with supervision Goal: Pt Will Transfer Bed To Chair/Chair To Bed Outcome: Progressing Flowsheets (Taken 08/17/2020 1444) Pt will Transfer Bed to Chair/Chair to Bed: with supervision Goal: Pt Will Ambulate Outcome: Progressing Flowsheets (Taken 08/17/2020 1444) Pt will Ambulate:  50 feet  with supervision  with rolling walker  Britta Mccreedy D. Hartnett-Rands, MS, PT [facility] PT [facility] [phone] 08/17/2020

## 2020-08-17 NOTE — Evaluation (Signed)
Physical Therapy Evaluation Patient Details Name: Candace Harris MRN: [id number] DOB: 1951/01/18 Today's Date: 08/17/2020   History of Present Illness  HPI: Candace Harris is a 70 y.o. female with medical history significant for HTN, CAD, anxiety, COPD (on 3-6 L of O2 at home), T2DM, GERD and bipolar disorder who presents to the emergency department due to 60-month onset of shortness of breath that has been progressively worsening, she complained of decreased oral intake due to loss of taste, she also endorsed loss of smell and she states that she had diarrhea for 2 to 3 weeks last month which has resolved.  She states that she saw her PCP in [place] who prescribed prednisone for her, but she has not yet filled the prescription.  She decided to go to the ED for further evaluation due to persistent worsening shortness of breath and concern for dehydration.    Clinical Impression  Pt admitted with above diagnosis. Patient ambulated 40 feet with RW and min guard assist. Patient required cues for sequencing of steps and placement of hands for transfers. Patient reports 2 falls at home in the last 6 months. Patient functioning near her baseline of being limited to household ambulation with baseline home O2. However, patient has 17 stairs to reach her apartment and her 71 year old son has been carrying her up and down the stairs for doctor's appointments, etc. No elevator available. PT consulted with case manager. Pt currently with functional limitations due to the deficits listed below (see PT Problem List). Pt will benefit from skilled PT to increase their independence and safety with mobility to allow discharge to the venue listed below.       Follow Up Recommendations SNF;Supervision - Intermittent;Supervision for mobility/OOB    Equipment Recommendations  3in1 (PT)    Recommendations for Other Services       Precautions / Restrictions Precautions Precautions: Fall Precaution Comments: patient reports  2 falls in the last 6 months Restrictions Weight Bearing Restrictions: No      Mobility  Bed Mobility Overal bed mobility: Needs Assistance Bed Mobility: Sit to Supine       Sit to supine: Supervision;HOB elevated   General bed mobility comments: slow, labored movement    Transfers Overall transfer level: Needs assistance Equipment used: Rolling walker (2 wheeled) Transfers: Sit to/from [employer] Sit to Stand: Min guard Stand pivot transfers: Min guard       General transfer comment: cues for sequencing of steps and placement of hands  Ambulation/Gait Ambulation/Gait assistance: Min guard Gait Distance (Feet): 40 Feet Assistive device: Rolling walker (2 wheeled) Gait Pattern/deviations: Step-through pattern;Decreased step length - right;Decreased step length - left;Decreased stride length;Trunk flexed;Narrow base of support Gait velocity: decreased   General Gait Details: slow, labored gait with RW, 2 LPM O2, limited by fatigue; tremulous extremities noted; 100% SpO2 and pulse 99 upon sitting  Stairs    Wheelchair Mobility    Modified Rankin (Stroke Patients Only)       Balance Overall balance assessment: Needs assistance Sitting-balance support: Bilateral upper extremity supported;Feet supported Sitting balance-Leahy Scale: Good Sitting balance - Comments: seted at EOB   Standing balance support: Bilateral upper extremity supported;During functional activity Standing balance-Leahy Scale: Fair Standing balance comment: fair/poor with RW; anxiety a factor        Pertinent Vitals/Pain Pain Assessment: 0-10 Pain Score: 7  Pain Location: low back and chest Pain Descriptors / Indicators: Dull;Sharp Pain Intervention(s): Limited activity within patient's tolerance;Monitored during session;Repositioned  Home Living Family/patient expects to be discharged to:: Private residence Living Arrangements: Children (patient's son and 68 year  old special needs granddaughter) Available Help at Discharge: Family;Available 24 hours/day Type of Home: Apartment Home Access: Stairs to enter Entrance Stairs-Rails: Right;Left (patient reports her son carries her up and down the stairs as she isn't strong enough.) Entrance Stairs-Number of Steps: 17 Home Layout: One level Home Equipment: Hand held shower head;Walker - 2 wheels;Walker - 4 wheels;Cane - single point;Grab bars - tub/shower Additional Comments: no place to sit in tub/shower right now    Prior Function Level of Independence: Needs assistance   Gait / Transfers Assistance Needed: household distances with RW; uses a wheelchair to get from car to doctor's office  ADL's / Homemaking Assistance Needed: able to feed and dress; needs assistance from son for all other B and IADLs        Hand Dominance   Dominant Hand: Right    Extremity/Trunk Assessment   Upper Extremity Assessment Upper Extremity Assessment: Generalized weakness    Lower Extremity Assessment Lower Extremity Assessment: Generalized weakness    Cervical / Trunk Assessment Cervical / Trunk Assessment: Kyphotic  Communication   Communication: No difficulties  Cognition Arousal/Alertness: Awake/alert Behavior During Therapy: WFL for tasks assessed/performed Overall Cognitive Status: Within Functional Limits for tasks assessed        General Comments      Exercises     Assessment/Plan    PT Assessment Patient needs continued PT services  PT Problem List Decreased strength;Decreased knowledge of use of DME;Decreased activity tolerance;Decreased balance;Pain;Decreased mobility       PT Treatment Interventions DME instruction;Balance training;Gait training;Stair training;Functional mobility training;Patient/family education;Therapeutic activities;Therapeutic exercise    PT Goals (Current goals can be found in the Care Plan section)  Acute Rehab PT Goals Patient Stated Goal: Go home to  support 69 year old special needs granddaughter. PT Goal Formulation: With patient Time For Goal Achievement: 08/31/20 Potential to Achieve Goals: Fair    Frequency Min 3X/week   Barriers to discharge           AM-PAC PT "6 Clicks" Mobility  Outcome Measure Help needed turning from your back to your side while in a flat bed without using bedrails?: A Little Help needed moving from lying on your back to sitting on the side of a flat bed without using bedrails?: A Lot Help needed moving to and from a bed to a chair (including a wheelchair)?: A Little Help needed standing up from a chair using your arms (e.g., wheelchair or bedside chair)?: A Little Help needed to walk in hospital room?: A Little Help needed climbing 3-5 steps with a railing? : A Lot 6 Click Score: 16    End of Session Equipment Utilized During Treatment: Gait belt;Oxygen Activity Tolerance: Patient limited by fatigue Patient left: in chair;with call bell/phone within reach Nurse Communication: Mobility status PT Visit Diagnosis: Unsteadiness on feet (R26.81);History of falling (Z91.81);Muscle weakness (generalized) (M62.81)    Time: 1340-1410 PT Time Calculation (min) (ACUTE ONLY): 30 min   Charges:   PT Evaluation $PT Eval Moderate Complexity: 1 Mod PT Treatments $Therapeutic Activity: 8-22 mins        Katina Dung. Hartnett-Rands, MS, PT [facility] PT [facility] #[id number] 08/17/2020, 2:39 PM

## 2020-08-17 NOTE — Progress Notes (Signed)
PROGRESS NOTE   Candace Harris  YHC:[id number] DOB: 08-03-1950 DOA: 08/15/2020 PCP: Jason Coop, FNP   Chief Complaint  Patient presents with  . Shortness of Breath   Level of care: Med-Surg  Brief Admission History:  70 y.o. female with medical history significant for HTN, CAD, anxiety, COPD (on 3-6 L of O2 at home), T2DM, GERD and bipolar disorder who presents to the emergency department due to 11-month onset of shortness of breath that has been progressively worsening, she complained of decreased oral intake due to loss of taste, she also endorsed loss of smell and she states that she had diarrhea for 2 to 3 weeks last month which has resolved.  She states that she saw her PCP in [place] who prescribed prednisone for her, but she has not yet filled the prescription.  She decided to go to the ED for further evaluation due to persistent worsening shortness of breath and concern for dehydration.  She was admitted with acute COPD exacerbation.    Assessment & Plan:   Principal Problem:   Shortness of breath Active Problems:   AKI (acute kidney injury) (HCC)   Dehydration   Leukocytosis   Thrombocytosis   Essential hypertension   Elevated d-dimer   Hypokalemia   Anxiety   COPD with acute exacerbation (HCC)   Hyperlipidemia   Diabetes mellitus (HCC)   GERD (gastroesophageal reflux disease)   CAD (coronary artery disease)   1. Acute on chronic respiratory failure with hypoxia - secondary to COPD exacerbation.  Pt slow to improve. Continue IV steroids, bronchodilators and supportive measures.   2. Hypomagnesemia - IV replacement ordered.  3. Hypokalemia - oral replacement ordered, recheck in AM.  4. Essential hypertension - continue home meds 5. GERD - protonix for GI protection.  6. Type 2 DM - continue SSI coverage.    DVT prophylaxis: enoxaparin  Code Status: full  Family Communication:  Disposition:  Status is: Inpatient  Remains inpatient appropriate  because:IV treatments appropriate due to intensity of illness or inability to take PO and Inpatient level of care appropriate due to severity of illness   Dispo: The patient is from: Home              Anticipated d/c is to: Home              Anticipated d/c date is: 1 day              Patient currently is not medically stable to d/c.   Difficult to place patient No    Consultants:     Procedures:     Antimicrobials:    Subjective: Pt reports she remains very SOB, she is wheezing and coughing.   Objective: Vitals:   08/17/20 0806 08/17/20 0941 08/17/20 1328 08/17/20 1422  BP:  113/78  115/74  Pulse:  99  96  Resp:  20  18  Temp:    98.7 F (37.1 C)  TempSrc:    Oral  SpO2: 94% 99% 100% 97%  Weight:      Height:        Intake/Output Summary (Last 24 hours) at 08/17/2020 1508 Last data filed at 08/17/2020 1300 Gross per 24 hour  Intake 960 ml  Output --  Net 960 ml   Filed Weights   08/15/20 2054  Weight: 60 kg    Examination:  General exam: speaking short sentences with pursed lips Respiratory system: bilateral expiratory wheezes. Moderate increased work of breathing. Cardiovascular system: normal  S1 & S2 heard. No JVD, murmurs, rubs, gallops or clicks. No pedal edema. Gastrointestinal system: Abdomen is nondistended, soft and nontender. No organomegaly or masses felt. Normal bowel sounds heard. Central nervous system: Alert and oriented. No focal neurological deficits. Extremities: Symmetric 5 x 5 power. Skin: No rashes, lesions or ulcers Psychiatry: Judgement and insight appear normal. Mood & affect appropriate.   Data Reviewed: I have personally reviewed following labs and imaging studies  CBC: Recent Labs  Lab 08/15/20 2234 08/16/20 0556  WBC 13.7* 12.9*  NEUTROABS 12.2*  --   HGB 10.6* 9.5*  HCT 33.1* 30.0*  MCV 81.7 82.4  PLT 552* 473*    Basic Metabolic Panel: Recent Labs  Lab 08/15/20 2234 08/16/20 0556 08/17/20 1014  NA 133* 136 139   K 3.4* 4.4 3.4*  CL 95* 101 112*  CO2 19* 21* 19*  GLUCOSE 119* 151* 166*  BUN 41* 36* 16  CREATININE 1.70* 1.29* 0.72  CALCIUM 9.8 9.6 8.8*  MG  --  1.6* 1.3*  PHOS  --  3.5  --     GFR: Estimated Creatinine Clearance: 53.2 mL/min (by C-G formula based on SCr of 0.72 mg/dL).  Liver Function Tests: Recent Labs  Lab 08/16/20 0556  AST 30  ALT 16  ALKPHOS 72  BILITOT 0.5  PROT 7.7  ALBUMIN 3.4*    CBG: Recent Labs  Lab 08/16/20 1648 08/16/20 1716 08/16/20 2039 08/17/20 0733 08/17/20 1105  GLUCAP 152* 135* 153* 134* 127*    Recent Results (from the past 240 hour(s))  SARS Coronavirus 2 by RT PCR (hospital order, performed in [facility] hospital lab) Nasopharyngeal Nasopharyngeal Swab     Status: None   Collection Time: 08/16/20  1:10 AM   Specimen: Nasopharyngeal Swab  Result Value Ref Range Status   SARS Coronavirus 2 NEGATIVE NEGATIVE Final    Comment: (NOTE) SARS-CoV-2 target nucleic acids are NOT DETECTED.  The SARS-CoV-2 RNA is generally detectable in upper and lower respiratory specimens during the acute phase of infection. The lowest concentration of SARS-CoV-2 viral copies this assay can detect is 250 copies / mL. A negative result does not preclude SARS-CoV-2 infection and should not be used as the sole basis for treatment or other patient management decisions.  A negative result may occur with improper specimen collection / handling, submission of specimen other than nasopharyngeal swab, presence of viral mutation(s) within the areas targeted by this assay, and inadequate number of viral copies (<250 copies / mL). A negative result must be combined with clinical observations, patient history, and epidemiological information.  Fact Sheet for Patients:   BoilerBrush.com.cy  Fact Sheet for Healthcare Providers: https://pope.com/  This test is not yet approved or  cleared by the [place] FDA  and has been authorized for detection and/or diagnosis of SARS-CoV-2 by FDA under an Emergency Use Authorization (EUA).  This EUA will remain in effect (meaning this test can be used) for the duration of the COVID-19 declaration under Section 564(b)(1) of the Act, 21 U.S.C. section 360bbb-3(b)(1), unless the authorization is terminated or revoked sooner.  Performed at [facility], [address], [place], [place] [address]      Radiology Studies: NM Pulmonary Perfusion  Result Date: 08/16/2020 CLINICAL DATA:  Positive D-dimer.  Suspected pulmonary embolus. EXAM: NUCLEAR MEDICINE PERFUSION LUNG SCAN TECHNIQUE: Perfusion images were obtained in multiple projections after intravenous injection of radiopharmaceutical. Ventilation scans intentionally deferred if perfusion scan and chest x-ray adequate for interpretation during COVID 19 epidemic.  RADIOPHARMACEUTICALS:  4.0 mCi Tc-68m MAA IV COMPARISON:  Chest x-ray 08/15/2020. FINDINGS: COPD noted on chest x-ray. Mild decreased perfusion in the lung bases, possibly related to COPD. No prominent segmental perfusion defects noted to suggest pulmonary embolus. IMPRESSION: Low probability pulmonary embolus. Electronically Signed   By: Maisie Fus  Register   On: 08/16/2020 07:37   DG Chest Port 1 View  Result Date: 08/15/2020 CLINICAL DATA:  Initial evaluation for acute shortness of breath. EXAM: PORTABLE CHEST 1 VIEW COMPARISON:  Prior radiograph from 07/15/2020. FINDINGS: Transverse heart size within normal limits. Mediastinal silhouette normal. Chronic coarsening in attenuation of the pulmonary markings compatible with emphysema. No focal infiltrates. No pulmonary edema or pleural effusion. No pneumothorax. Nodular density overlying the mid left chest favored to be related to overlying EKG lead. No acute osseous finding.  Cervical ACDF noted. IMPRESSION: 1. No active cardiopulmonary disease. 2. Emphysema. Electronically Signed   By: Rise Mu M.D.    On: 08/15/2020 21:36    Scheduled Meds: . albuterol  2 puff Inhalation Q6H  . amLODipine  2.5 mg Oral Daily  . azithromycin  250 mg Oral Daily  . dextromethorphan-guaiFENesin  1 tablet Oral BID  . enoxaparin (LOVENOX) injection  40 mg Subcutaneous Q24H  . fluticasone furoate-vilanterol  1 puff Inhalation Daily   And  . umeclidinium bromide  1 puff Inhalation Daily  . insulin aspart  0-5 Units Subcutaneous QHS  . insulin aspart  0-9 Units Subcutaneous TID WC  . methylPREDNISolone (SOLU-MEDROL) injection  40 mg Intravenous Q12H  . pantoprazole  40 mg Oral Daily  . potassium chloride  40 mEq Oral Once  . rosuvastatin  20 mg Oral QHS   Continuous Infusions: . sodium chloride 35 mL/hr at 08/17/20 0936  . magnesium sulfate bolus IVPB       LOS: 1 day   Time spent: 35 mins   Cannon Quinton Laural Benes, MD How to contact the [facility] Attending or Consulting provider 7A - 7P or covering provider during after hours 7P -7A, for this patient?  1. Check the care team in [facility] and look for a) attending/consulting TRH provider listed and b) the [facility] team listed 2. Log into [email or web address] and use [place]'s universal password to access. If you do not have the password, please contact the hospital operator. 3. Locate the [facility] provider you are looking for under [facility] and page to a number that you can be directly reached. 4. If you still have difficulty reaching the provider, please page the [facility] (Director on Call) for the Hospitalists listed on amion for assistance.  08/17/2020, 3:08 PM

## 2020-08-17 NOTE — TOC Initial Note (Signed)
Transition of Care [facility]) - Initial/Assessment Note    Patient Details  Name: Candace Harris MRN: [id number] Date of Birth: 04/27/1951  Transition of Care [facility]) CM/SW Contact:    Elliot Gault, LCSW Phone Number: 08/17/2020, 2:50 PM  Clinical Narrative:                  Pt admitted from home. PT recommending SNF. Spoke with pt to assess. Per pt, she lives in the [address] in [place]. She has 17 steps to her apartment and her son has been having to help carry her up the stairs. Pt has Home O2 and a walker for DME. Her Home O2 is with [employer].   Discussed PT recommendation with pt who states that she does not want to go to SNF. Per pt, she has a special needs granddaughter that is very attached to her and she needs to be home for her. Pt is agreeable to [facility] PT. Discussed provider options and referred to [facility] at pt request. Pt is also agreeable to [facility] SW.   At pt request, attempted to contact pt's landlord to inquire if there is anything that could be done to try and get pt a first floor apartment. Had to leave a voicemail message requesting return call. HH SW can further address this issue if there is no contact before pt is discharged.   Pt also asking if she could get a tub transfer bench at dc. Updated pt that her insurance will likely not cover the cost and that a tub transfer bench is $60 with [facility] and a shower chair would be around $40. Updated pt that she can also look into purchasing one on her own. Pt stated she would think about it and let TOC know tomorrow.  Assigned TOC will follow.   Expected Discharge Plan: Home w Home Health Services Barriers to Discharge: Continued Medical Work up   Patient Goals and CMS Choice Patient states their goals for this hospitalization and ongoing recovery are:: go home CMS Medicare.gov Compare Post Acute Care list provided to:: Patient Choice offered to / list presented to : Patient  Expected Discharge Plan and  Services Expected Discharge Plan: Home w Home Health Services In-house Referral: Clinical Social Work   Post Acute Care Choice: Home Health Living arrangements for the past 2 months: Apartment                 DME Arranged: Tub bench DME Agency: [employer] Date DME Agency Contacted: 08/17/20   Representative spoke with at DME Agency: [facility] Arranged: PT,Social Work HH Agency: [facility] Date [facility] Agency Contacted: 08/17/20   Representative spoke with at [facility] Agency: Kandee Keen  Prior Living Arrangements/Services Living arrangements for the past 2 months: Apartment Lives with:: Adult Children Patient language and need for interpreter reviewed:: Yes Do you feel safe going back to the place where you live?: Yes      Need for Family Participation in Patient Care: Yes (Comment) Care giver support system in place?: Yes (comment) Current home services: DME Criminal Activity/Legal Involvement Pertinent to Current Situation/Hospitalization: No - Comment as needed  Activities of Daily Living Home Assistive Devices/Equipment: Oxygen ADL Screening (condition at time of admission) Patient's cognitive ability adequate to safely complete daily activities?: Yes Is the patient deaf or have difficulty hearing?: No Does the patient have difficulty seeing, even when wearing glasses/contacts?: No Does the patient have difficulty concentrating, remembering, or making decisions?: No Patient able to express  need for assistance with ADLs?: Yes Does the patient have difficulty dressing or bathing?: No Independently performs ADLs?: Yes (appropriate for developmental age) Does the patient have difficulty walking or climbing stairs?: No Weakness of Legs: None Weakness of Arms/Hands: None  Permission Sought/Granted Permission sought to share information with : Oceanographer granted to share information with : Yes, Verbal Permission Granted     Permission granted to  share info w AGENCY: [facility]        Emotional Assessment   Attitude/Demeanor/Rapport: Engaged Affect (typically observed): Pleasant Orientation: : Oriented to Self,Oriented to Place,Oriented to  Time,Oriented to Situation Alcohol / Substance Use: Not Applicable Psych Involvement: No (comment)  Admission diagnosis:  Shortness of breath [R06.02] Anxiety [F41.9] SOB (shortness of breath) [R06.02] COPD exacerbation (HCC) [J44.1] AKI (acute kidney injury) (HCC) [N17.9] COPD with acute exacerbation (HCC) [J44.1] Patient Active Problem List   Diagnosis Date Noted  . Shortness of breath 08/16/2020  . AKI (acute kidney injury) (HCC) 08/16/2020  . Dehydration 08/16/2020  . Leukocytosis 08/16/2020  . Thrombocytosis 08/16/2020  . Essential hypertension 08/16/2020  . Elevated d-dimer 08/16/2020  . Hypokalemia 08/16/2020  . Anxiety 08/16/2020  . COPD with acute exacerbation (HCC) 08/16/2020  . Hyperlipidemia 08/16/2020  . Diabetes mellitus (HCC) 08/16/2020  . GERD (gastroesophageal reflux disease) 08/16/2020  . CAD (coronary artery disease) 08/16/2020   PCP:  Jason Coop, FNP Pharmacy:   [facility] [place] [place] - [address] [place] [address] Phone: [phone] Fax: [phone]     Social Determinants of Health (SDOH) Interventions    Readmission Risk Interventions Readmission Risk Prevention Plan 08/17/2020  Transportation Screening Complete  Home Care Screening Complete  Medication Review (RN CM) Complete  Some recent data might be hidden

## 2020-08-18 LAB — BASIC METABOLIC PANEL
Anion gap: 6 (ref 5–15)
BUN: 11 mg/dL (ref 8–23)
CO2: 22 mmol/L (ref 22–32)
Calcium: 8.9 mg/dL (ref 8.9–10.3)
Chloride: 113 mmol/L — ABNORMAL HIGH (ref 98–111)
Creatinine, Ser: 0.7 mg/dL (ref 0.44–1.00)
GFR, Estimated: >60 mL/min (ref >60)
Glucose, Bld: 154 mg/dL — ABNORMAL HIGH (ref 70–99)
Potassium: 4.5 mmol/L (ref 3.5–5.1)
Sodium: 141 mmol/L (ref 135–145)

## 2020-08-18 LAB — GLUCOSE, CAPILLARY
Glucose-Capillary: 150 mg/dL — ABNORMAL HIGH (ref 70–99)
Glucose-Capillary: 99 mg/dL (ref 70–99)
Glucose-Capillary: 138 mg/dL — ABNORMAL HIGH (ref 70–99)

## 2020-08-18 LAB — MAGNESIUM: Magnesium: 1.9 mg/dL (ref 1.7–2.4)

## 2020-08-18 MED ORDER — DM-GUAIFENESIN ER 30-600 MG PO TB12
1.0000 | ORAL_TABLET | Freq: Two times a day (BID) | ORAL | 0 refills | Status: AC | PRN
Start: 2020-08-18 — End: 2020-08-23

## 2020-08-18 MED ORDER — METHOCARBAMOL 500 MG PO TABS
500.0000 mg | ORAL_TABLET | Freq: Four times a day (QID) | ORAL | Status: DC | PRN
  Administered 2020-08-18: 500 mg via ORAL
  Filled 2020-08-18: qty 1

## 2020-08-18 MED ORDER — ALBUTEROL SULFATE (2.5 MG/3ML) 0.083% IN NEBU: 3.0000 mL | INHALATION_SOLUTION | Freq: Two times a day (BID) | RESPIRATORY_TRACT | Status: DC

## 2020-08-18 MED ORDER — PREDNISONE 20 MG PO TABS: ORAL_TABLET | ORAL | 0 refills | Status: DC

## 2020-08-18 MED ORDER — ALBUTEROL SULFATE HFA 108 (90 BASE) MCG/ACT IN AERS: 2.0000 | INHALATION_SPRAY | RESPIRATORY_TRACT | 2 refills | Status: DC | PRN

## 2020-08-18 MED ORDER — AZITHROMYCIN 250 MG PO TABS: 250.0000 mg | ORAL_TABLET | Freq: Every day | ORAL | 0 refills | Status: AC

## 2020-08-18 NOTE — Discharge Instructions (Signed)
Chest x-ray negative.  Use the albuterol inhaler 2 puffs every 6 hours.  Take the prednisone as directed for the next 5 days.  Make an appointment follow-up with your regular doctor.  Return for any new or worse symptoms.   IMPORTANT INFORMATION: PAY CLOSE ATTENTION   PHYSICIAN DISCHARGE INSTRUCTIONS  Follow with Primary care provider  Jason Coop, FNP  and other consultants as instructed by your Hospitalist Physician  SEEK MEDICAL CARE OR RETURN TO EMERGENCY ROOM IF SYMPTOMS COME BACK, WORSEN OR NEW PROBLEM DEVELOPS   Please note: You were cared for by a hospitalist during your hospital stay. Every effort will be made to forward records to your primary care provider.  You can request that your primary care provider send for your hospital records if they have not received them.  Once you are discharged, your primary care physician will handle any further medical issues. Please note that NO REFILLS for any discharge medications will be authorized once you are discharged, as it is imperative that you return to your primary care physician (or establish a relationship with a primary care physician if you do not have one) for your post hospital discharge needs so that they can reassess your need for medications and monitor your lab values.  Please get a complete blood count and chemistry panel checked by your Primary MD at your next visit, and again as instructed by your Primary MD.  Get Medicines reviewed and adjusted: Please take all your medications with you for your next visit with your Primary MD  Laboratory/radiological data: Please request your Primary MD to go over all hospital tests and procedure/radiological results at the follow up, please ask your primary care provider to get all Hospital records sent to his/her office.  In some cases, they will be blood work, cultures and biopsy results pending at the time of your discharge. Please request that your primary care provider  follow up on these results.  If you are diabetic, please bring your blood sugar readings with you to your follow up appointment with primary care.    Please call and make your follow up appointments as soon as possible.    Also Note the following: If you experience worsening of your admission symptoms, develop shortness of breath, life threatening emergency, suicidal or homicidal thoughts you must seek medical attention immediately by calling 911 or calling your MD immediately  if symptoms less severe.  You must read complete instructions/literature along with all the possible adverse reactions/side effects for all the Medicines you take and that have been prescribed to you. Take any new Medicines after you have completely understood and accpet all the possible adverse reactions/side effects.   Do not drive when taking Pain medications or sleeping medications (Benzodiazepines)  Do not take more than prescribed Pain, Sleep and Anxiety Medications. It is not advisable to combine anxiety,sleep and pain medications without talking with your primary care practitioner  Special Instructions: If you have smoked or chewed Tobacco  in the last 2 yrs please stop smoking, stop any regular Alcohol  and or any Recreational drug use.  Wear Seat belts while driving.  Do not drive if taking any narcotic, mind altering or controlled substances or recreational drugs or alcohol.

## 2020-08-18 NOTE — Care Management Important Message (Signed)
Important Message  Patient Details  Name: Candace Harris MRN: [id number] Date of Birth: Jun 14, 1951   Medicare Important Message Given:  Yes     Corey Harold 08/18/2020, 2:01 PM

## 2020-08-18 NOTE — TOC Transition Note (Signed)
Transition of Care [facility]) - CM/SW Discharge Note   Patient Details  Name: Candace Harris MRN: [id number] Date of Birth: [birth year]  Transition of [facility] CM/SW Contact:  Barry Brunner, LCSW Phone Number: 08/18/2020, 12:52 PM   Clinical Narrative:    CSW notified of patient's readiness for discharge. CSW contacted the patient to inquire if patient would need any additional equipment at home to assist with transfer or with increasing mobility in the bathroom patient reported that she did not need any additonal equipment and that her walker was sufficient. MD reported that patient currently has home O2 already established. TOC signing off.    Final next level of care: Home w Home Health Services Barriers to Discharge: Barriers Resolved   Patient Goals and CMS Choice Patient states their goals for this hospitalization and ongoing recovery are:: Return home with [facility] CMS Medicare.gov Compare Post Acute Care list provided to:: Patient Choice offered to / list presented to : Patient  Discharge Placement                    Patient and family notified of of transfer: 08/18/20  Discharge Plan and Services In-house Referral: Clinical Social Work   Post Acute Care Choice: Home Health          DME Arranged: N/A DME Agency: NA Date DME Agency Contacted: 08/17/20   Representative spoke with at DME Agency: Shelby Dubin Arranged: PT,Social Work HH Agency: [facility] Date [facility] Agency Contacted: 08/18/20 Time HH Agency Contacted: [id number] Representative spoke with at [facility] Agency: Denyse Amass  Social Determinants of Health (SDOH) Interventions     Readmission Risk Interventions Readmission Risk Prevention Plan 08/17/2020  Transportation Screening Complete  Home Care Screening Complete  Medication Review (RN CM) Complete  Some recent data might be hidden

## 2020-08-18 NOTE — Discharge Summary (Addendum)
Physician Discharge Summary  Candace Harris ZOX:[id number] DOB: February 13, 1951 DOA: 08/15/2020  PCP: Jason Coop, FNP  Admit date: 08/15/2020 Discharge date: 08/18/2020  Admitted From:  Home  Disposition: Home with HH (declines SNF)  Recommendations for Outpatient Follow-up:  1. Follow up with PCP in 1 weeks  Discharge Condition: STABLE   CODE STATUS: FULL    Brief Hospitalization Summary: Please see all hospital notes, images, labs for full details of the hospitalization. ADMISSION HPI: Candace Harris is a 70 y.o. female with medical history significant for HTN, CAD, anxiety, COPD (on 3-6 L of O2 at home), T2DM, GERD and bipolar disorder who presents to the emergency department due to 83-month onset of shortness of breath that has been progressively worsening, she complained of decreased oral intake due to loss of taste, she also endorsed loss of smell and she states that she had diarrhea for 2 to 3 weeks last month which has resolved.  She states that she saw her PCP in [place] who prescribed prednisone for her, but she has not yet filled the prescription.  She decided to go to the ED for further evaluation due to persistent worsening shortness of breath and concern for dehydration.  ED Course: In the emergency department, she was tachycardic and intermittently tachypneic. Work-up in the ED showed leukocytosis, thrombocytosis, hypokalemia, hyponatremia, BUN/creatinine 41/1.70 (baseline creatinine was 0.7). D-dimer 0.78. Chest x-ray was reflexive of emphysema but showed no active cardiopulmonary disease Breathing treatment was provided, Solu-Medrol 125 Mg x1 was given, IV Ativan Librium x1 due to anxiety was given and patient was provided with 500 mL of IV NS. Hospitalist was asked to admit patient for further evaluation and management.  Hospital Course   1. Acute on chronic respiratory failure with hypoxia - secondary to COPD exacerbation.  Pt much improved after IV steroids,  bronchodilators and supportive measures.   DC home today.  Prednisone taper.   2. Hypomagnesemia - IV replacement given and repleted.  3. Hypokalemia - repleted.  4. Essential hypertension - continue home meds 5. GERD - protonix for GI protection.  6. Type 2 DM - monitored and stable.   7. AKI - resolved after hydration.   Discharge Diagnoses:  Principal Problem:   Shortness of breath Active Problems:   AKI (acute kidney injury) (HCC)   Dehydration   Leukocytosis   Thrombocytosis   Essential hypertension   Elevated d-dimer   Hypokalemia   Anxiety   COPD with acute exacerbation (HCC)   Hyperlipidemia   Diabetes mellitus (HCC)   GERD (gastroesophageal reflux disease)   CAD (coronary artery disease)   Discharge Instructions:  Allergies as of 08/18/2020      Reactions   Alendronate Other (See Comments)   Sore muscles, burning in chest Sore muscles, burning in chest   Ibuprofen Nausea And Vomiting   Other reaction(s): Unknown   Ramipril Nausea And Vomiting, Other (See Comments)   Sulfabenzamide Other (See Comments)   HEADACHE      Medication List    STOP taking these medications   ARIPiprazole 5 MG tablet Commonly known as: ABILIFY   celecoxib 200 MG capsule Commonly known as: CELEBREX   levofloxacin 750 MG tablet Commonly known as: LEVAQUIN   losartan 50 MG tablet Commonly known as: COZAAR   sertraline 100 MG tablet Commonly known as: ZOLOFT     TAKE these medications   ADVIL PO Take 500 mg by mouth daily as needed. Notes to patient: Not given during hospital stay  albuterol 108 (90 Base) MCG/ACT inhaler Commonly known as: VENTOLIN HFA Inhale 2 puffs into the lungs every 4 (four) hours as needed for wheezing or shortness of breath. for wheezing What changed:   how to take this  reasons to take this   amLODipine 2.5 MG tablet Commonly known as: NORVASC TAKE 1 TABLET EVERY DAY   azithromycin 250 MG tablet Commonly known as: Zithromax  Z-Pak Take 1 tablet (250 mg total) by mouth daily for 3 days. Start taking on: August 19, 2020   cyclobenzaprine 10 MG tablet Commonly known as: FLEXERIL Take 1 tablet by mouth 2 (two) times daily. Notes to patient: Not given during hospital stay    dextromethorphan-guaiFENesin 30-600 MG 12hr tablet Commonly known as: MUCINEX DM Take 1 tablet by mouth 2 (two) times daily as needed for up to 5 days for cough.   dicyclomine 20 MG tablet Commonly known as: BENTYL Take 20 mg by mouth 4 (four) times daily. Notes to patient: Not given during hospital stay    DULoxetine 30 MG capsule Commonly known as: CYMBALTA Take 30 mg by mouth daily. Notes to patient: Not given during hospital stay    HYDROcodone-acetaminophen 10-325 MG tablet Commonly known as: NORCO Take 1 tablet by mouth 4 (four) times daily as needed.   metFORMIN 500 MG tablet Commonly known as: GLUCOPHAGE TAKE 1 TABLET TWICE DAILY WITH MEALS Notes to patient: Not given during hospital stay    nitroGLYCERIN 0.4 MG SL tablet Commonly known as: NITROSTAT Place 0.4 mg under the tongue. Notes to patient: Not given during hospital stay    omeprazole 40 MG capsule Commonly known as: PRILOSEC Take 40 mg by mouth every morning. Notes to patient: Not given during hospital stay    ondansetron 4 MG tablet Commonly known as: ZOFRAN Take 4 mg by mouth daily. Notes to patient: Not given during hospital stay    predniSONE 10 MG tablet Commonly known as: DELTASONE Take 4 tablets (40 mg total) by mouth daily. What changed:   medication strength  how much to take  when to take this   predniSONE 20 MG tablet Commonly known as: DELTASONE Take 2 PO QAM x5days, 1PO QAM x5days Start taking on: August 19, 2020 What changed: You were already taking a medication with the same name, and this prescription was added. Make sure you understand how and when to take each.   rosuvastatin 20 MG tablet Commonly known as: CRESTOR Take  20 mg by mouth at bedtime.   Trelegy Ellipta 100-62.5-25 MCG/INH Aepb Generic drug: Fluticasone-Umeclidin-Vilant Inhale 1 puff into the lungs daily. Notes to patient: Not given during hospital stay    Xtampza ER 9 MG C12a Generic drug: oxyCODONE ER Take 1 capsule by mouth 2 (two) times daily.            Durable Medical Equipment  (From admission, onward)         Start     Ordered   08/17/20 1503  For home use only DME 3 n 1  Once        08/17/20 1502          Follow-up Information    Jason Coop, FNP. Schedule an appointment as soon as possible for a visit in 1 week(s).   Specialty: Family Medicine Contact information: [facility] [address] [place] [address] [phone]              Allergies  Allergen Reactions  . Alendronate Other (See Comments)    Sore muscles, burning in chest Sore muscles, burning in chest   . Ibuprofen Nausea And Vomiting    Other reaction(s): Unknown   . Ramipril Nausea And Vomiting and Other (See Comments)  . Sulfabenzamide Other (See Comments)    HEADACHE    Allergies as of 08/18/2020      Reactions   Alendronate Other (See Comments)   Sore muscles, burning in chest Sore muscles, burning in chest   Ibuprofen Nausea And Vomiting   Other reaction(s): Unknown   Ramipril Nausea And Vomiting, Other (See Comments)   Sulfabenzamide Other (See Comments)   HEADACHE      Medication List    STOP taking these medications   ARIPiprazole 5 MG tablet Commonly known as: ABILIFY   celecoxib 200 MG capsule Commonly known as: CELEBREX   levofloxacin 750 MG tablet Commonly known as: LEVAQUIN   losartan 50 MG tablet Commonly known as: COZAAR   sertraline 100 MG tablet Commonly known as: ZOLOFT     TAKE these medications   ADVIL PO Take 500 mg by mouth daily as needed. Notes to patient: Not given during hospital stay    albuterol 108 (90 Base) MCG/ACT  inhaler Commonly known as: VENTOLIN HFA Inhale 2 puffs into the lungs every 4 (four) hours as needed for wheezing or shortness of breath. for wheezing What changed:   how to take this  reasons to take this   amLODipine 2.5 MG tablet Commonly known as: NORVASC TAKE 1 TABLET EVERY DAY   azithromycin 250 MG tablet Commonly known as: Zithromax Z-Pak Take 1 tablet (250 mg total) by mouth daily for 3 days. Start taking on: August 19, 2020   cyclobenzaprine 10 MG tablet Commonly known as: FLEXERIL Take 1 tablet by mouth 2 (two) times daily. Notes to patient: Not given during hospital stay    dextromethorphan-guaiFENesin 30-600 MG 12hr tablet Commonly known as: MUCINEX DM Take 1 tablet by mouth 2 (two) times daily as needed for up to 5 days for cough.   dicyclomine 20 MG tablet Commonly known as: BENTYL Take 20 mg by mouth 4 (four) times daily. Notes to patient: Not given during hospital stay    DULoxetine 30 MG capsule Commonly known as: CYMBALTA Take 30 mg by mouth daily. Notes to patient: Not given during hospital stay    HYDROcodone-acetaminophen 10-325 MG tablet Commonly known as: NORCO Take 1 tablet by mouth 4 (four) times daily as needed.   metFORMIN 500 MG tablet Commonly known as: GLUCOPHAGE TAKE 1 TABLET TWICE DAILY WITH MEALS Notes to patient: Not given during hospital stay    nitroGLYCERIN 0.4 MG SL tablet Commonly known as: NITROSTAT Place 0.4 mg under the tongue. Notes to patient: Not given during hospital stay    omeprazole 40 MG capsule Commonly known as: PRILOSEC Take 40 mg by mouth every morning. Notes to patient: Not given during hospital stay    ondansetron 4 MG tablet Commonly known as: ZOFRAN Take 4 mg by mouth daily. Notes to patient: Not given during hospital stay    predniSONE 10 MG tablet Commonly known as: DELTASONE Take 4 tablets (40 mg total) by mouth daily. What changed:   medication strength  how much to take  when to take  this   predniSONE 20 MG tablet Commonly known as: DELTASONE Take 2 PO QAM x5days, 1PO QAM x5days Start taking on: August 19, 2020 What changed: You were already taking a  medication with the same name, and this prescription was added. Make sure you understand how and when to take each.   rosuvastatin 20 MG tablet Commonly known as: CRESTOR Take 20 mg by mouth at bedtime.   Trelegy Ellipta 100-62.5-25 MCG/INH Aepb Generic drug: Fluticasone-Umeclidin-Vilant Inhale 1 puff into the lungs daily. Notes to patient: Not given during hospital stay    Xtampza ER 9 MG C12a Generic drug: oxyCODONE ER Take 1 capsule by mouth 2 (two) times daily.            Durable Medical Equipment  (From admission, onward)         Start     Ordered   08/17/20 1503  For home use only DME 3 n 1  Once        08/17/20 1502          Procedures/Studies: NM Pulmonary Perfusion  Result Date: 08/16/2020 CLINICAL DATA:  Positive D-dimer.  Suspected pulmonary embolus. EXAM: NUCLEAR MEDICINE PERFUSION LUNG SCAN TECHNIQUE: Perfusion images were obtained in multiple projections after intravenous injection of radiopharmaceutical. Ventilation scans intentionally deferred if perfusion scan and chest x-ray adequate for interpretation during COVID 19 epidemic. RADIOPHARMACEUTICALS:  4.0 mCi Tc-2256m MAA IV COMPARISON:  Chest x-ray 08/15/2020. FINDINGS: COPD noted on chest x-ray. Mild decreased perfusion in the lung bases, possibly related to COPD. No prominent segmental perfusion defects noted to suggest pulmonary embolus. IMPRESSION: Low probability pulmonary embolus. Electronically Signed   By: Maisie Fushomas  Register   On: 08/16/2020 07:37   DG Chest Port 1 View  Result Date: 08/15/2020 CLINICAL DATA:  Initial evaluation for acute shortness of breath. EXAM: PORTABLE CHEST 1 VIEW COMPARISON:  Prior radiograph from 07/15/2020. FINDINGS: Transverse heart size within normal limits. Mediastinal silhouette normal. Chronic  coarsening in attenuation of the pulmonary markings compatible with emphysema. No focal infiltrates. No pulmonary edema or pleural effusion. No pneumothorax. Nodular density overlying the mid left chest favored to be related to overlying EKG lead. No acute osseous finding.  Cervical ACDF noted. IMPRESSION: 1. No active cardiopulmonary disease. 2. Emphysema. Electronically Signed   By: Rise MuBenjamin  McClintock M.D.   On: 08/15/2020 21:36      Subjective: Pt reports feeling better today and able to ambulate better, declines SNF.    Discharge Exam: Vitals:   08/18/20 0528 08/18/20 0716  BP: 119/87   Pulse: 91   Resp: 18   Temp: 98.2 F (36.8 C)   SpO2: 100% 99%   Vitals:   08/17/20 2010 08/17/20 2108 08/18/20 0528 08/18/20 0716  BP:  105/73 119/87   Pulse:  96 91   Resp:  20 18   Temp:  98.2 F (36.8 C) 98.2 F (36.8 C)   TempSrc:  Oral    SpO2: 98% 100% 100% 99%  Weight:      Height:        General: Pt is alert, awake, not in acute distress Cardiovascular: RRR, S1/S2 +, no rubs, no gallops Respiratory: clear, bilateral good air movement, no wheezing, no rhonchi Abdominal: Soft, NT, ND, bowel sounds + Extremities: no edema, no cyanosis   The results of significant diagnostics from this hospitalization (including imaging, microbiology, ancillary and laboratory) are listed below for reference.     Microbiology: Recent Results (from the past 240 hour(s))  SARS Coronavirus 2 by RT PCR (hospital order, performed [facility] lab) Nasopharyngeal Nasopharyngeal Swab     Status: None   Collection Time: 08/16/20  1:10 AM   Specimen: Nasopharyngeal Swab  Result Value Ref Range Status   SARS Coronavirus 2 NEGATIVE NEGATIVE Final    Comment: (NOTE) SARS-CoV-2 target nucleic acids are NOT DETECTED.  The SARS-CoV-2 RNA is generally detectable in upper and lower respiratory specimens during the acute phase of infection. The lowest concentration of SARS-CoV-2 viral copies this  assay can detect is 250 copies / mL. A negative result does not preclude SARS-CoV-2 infection and should not be used as the sole basis for treatment or other patient management decisions.  A negative result may occur with improper specimen collection / handling, submission of specimen other than nasopharyngeal swab, presence of viral mutation(s) within the areas targeted by this assay, and inadequate number of viral copies (<250 copies / mL). A negative result must be combined with clinical observations, patient history, and epidemiological information.  Fact Sheet for Patients:   BoilerBrush.com.cy  Fact Sheet for Healthcare Providers: https://pope.com/  This test is not yet approved or  cleared by the [place] FDA and has been authorized for detection and/or diagnosis of SARS-CoV-2 by FDA under an Emergency Use Authorization (EUA).  This EUA will remain in effect (meaning this test can be used) for the duration of the COVID-19 declaration under Section 564(b)(1) of the Act, 21 U.S.C. section 360bbb-3(b)(1), unless the authorization is terminated or revoked sooner.  Performed at [facility], [address] [place], [place] [address]      Labs: BNP (last 3 results) No results for input(s): BNP in the last 8760 hours. Basic Metabolic Panel: Recent Labs  Lab 08/15/20 2234 08/16/20 0556 08/17/20 1014 08/18/20 0552  NA 133* 136 139 141  K 3.4* 4.4 3.4* 4.5  CL 95* 101 112* 113*  CO2 19* 21* 19* 22  GLUCOSE 119* 151* 166* 154*  BUN 41* 36* 16 11  CREATININE 1.70* 1.29* 0.72 0.70  CALCIUM 9.8 9.6 8.8* 8.9  MG  --  1.6* 1.3* 1.9  PHOS  --  3.5  --   --    Liver Function Tests: Recent Labs  Lab 08/16/20 0556  AST 30  ALT 16  ALKPHOS 72  BILITOT 0.5  PROT 7.7  ALBUMIN 3.4*   No results for input(s): LIPASE, AMYLASE in the last 168 hours. No results for input(s): AMMONIA in the last 168 hours. CBC: Recent Labs   Lab 08/15/20 2234 08/16/20 0556  WBC 13.7* 12.9*  NEUTROABS 12.2*  --   HGB 10.6* 9.5*  HCT 33.1* 30.0*  MCV 81.7 82.4  PLT 552* 473*   Cardiac Enzymes: No results for input(s): CKTOTAL, CKMB, CKMBINDEX, TROPONINI in the last 168 hours. BNP: Invalid input(s): POCBNP CBG: Recent Labs  Lab 08/17/20 1105 08/17/20 1604 08/17/20 2144 08/18/20 0712 08/18/20 1121  GLUCAP 127* 106* 187* 150* 99   D-Dimer Recent Labs    08/15/20 2234  DDIMER 0.78*   Hgb A1c Recent Labs    08/15/20 2234  HGBA1C 6.7*   Lipid Profile No results for input(s): CHOL, HDL, LDLCALC, TRIG, CHOLHDL, LDLDIRECT in the last 72 hours. Thyroid function studies No results for input(s): TSH, T4TOTAL, T3FREE, THYROIDAB in the last 72 hours.  Invalid input(s): FREET3 Anemia work up No results for input(s): VITAMINB12, FOLATE, FERRITIN, TIBC, IRON, RETICCTPCT in the last 72 hours. Urinalysis    Component Value Date/Time   COLORURINE YELLOW 07/15/2020 1043   APPEARANCEUR CLEAR 07/15/2020 1043   LABSPEC 1.004 (L) 07/15/2020 1043   PHURINE 7.0 07/15/2020 1043   GLUCOSEU NEGATIVE 07/15/2020 1043   HGBUR NEGATIVE 07/15/2020 1043  BILIRUBINUR NEGATIVE 07/15/2020 1043   KETONESUR NEGATIVE 07/15/2020 1043   PROTEINUR NEGATIVE 07/15/2020 1043   NITRITE NEGATIVE 07/15/2020 1043   LEUKOCYTESUR NEGATIVE 07/15/2020 1043   Sepsis Labs Invalid input(s): PROCALCITONIN,  WBC,  LACTICIDVEN Microbiology Recent Results (from the past 240 hour(s))  SARS Coronavirus 2 by RT PCR (hospital order, performed in [facility] hospital lab) Nasopharyngeal Nasopharyngeal Swab     Status: None   Collection Time: 08/16/20  1:10 AM   Specimen: Nasopharyngeal Swab  Result Value Ref Range Status   SARS Coronavirus 2 NEGATIVE NEGATIVE Final    Comment: (NOTE) SARS-CoV-2 target nucleic acids are NOT DETECTED.  The SARS-CoV-2 RNA is generally detectable in upper and lower respiratory specimens during the acute phase of  infection. The lowest concentration of SARS-CoV-2 viral copies this assay can detect is 250 copies / mL. A negative result does not preclude SARS-CoV-2 infection and should not be used as the sole basis for treatment or other patient management decisions.  A negative result may occur with improper specimen collection / handling, submission of specimen other than nasopharyngeal swab, presence of viral mutation(s) within the areas targeted by this assay, and inadequate number of viral copies (<250 copies / mL). A negative result must be combined with clinical observations, patient history, and epidemiological information.  Fact Sheet for Patients:   BoilerBrush.com.cy  Fact Sheet for Healthcare Providers: https://pope.com/  This test is not yet approved or  cleared by the [place] FDA and has been authorized for detection and/or diagnosis of SARS-CoV-2 by FDA under an Emergency Use Authorization (EUA).  This EUA will remain in effect (meaning this test can be used) for the duration of the COVID-19 declaration under Section 564(b)(1) of the Act, 21 U.S.C. section 360bbb-3(b)(1), unless the authorization is terminated or revoked sooner.  Performed at [facility], [address] [place], [place] [address]    Time coordinating discharge: 33 mins   SIGNED:  Standley Dakins, MD  [facility] 08/18/2020, 12:24 PM How to contact the [facility] Attending or Consulting provider 7A - 7P or covering provider during after hours 7P -7A, for this patient?  1. Check the care team in [facility] and look for a) attending/consulting [facility] provider listed and b) the [facility] team listed 2. Log into [email or web address] and use [facility] universal password to access. If you do not have the password, please contact the hospital operator. 3. Locate the [facility] provider you are looking for under [facility] and page to a number that you can be directly reached. 4. If  you still have difficulty reaching the provider, please page the [facility] (Director on Call) for the Hospitalists listed on amion for assistance.

## 2020-08-18 NOTE — Plan of Care (Signed)

## 2020-08-18 NOTE — Plan of Care (Signed)
  Problem: Education: Goal: Knowledge of General Education information will improve Description: Including pain rating scale, medication(s)/side effects and non-pharmacologic comfort measures 08/18/2020 1050 by Felecia Jan, LPN Outcome: Progressing 08/18/2020 1049 by Felecia Jan, LPN Outcome: Progressing   Problem: Health Behavior/Discharge Planning: Goal: Ability to manage health-related needs will improve 08/18/2020 1050 by Felecia Jan, LPN Outcome: Progressing 08/18/2020 1049 by Felecia Jan, LPN Outcome: Progressing   Problem: Clinical Measurements: Goal: Ability to maintain clinical measurements within normal limits will improve 08/18/2020 1050 by Felecia Jan, LPN Outcome: Progressing 08/18/2020 1049 by Meryl Dare A, LPN Outcome: Progressing Goal: Will remain free from infection 08/18/2020 1050 by Felecia Jan, LPN Outcome: Progressing 08/18/2020 1049 by Meryl Dare A, LPN Outcome: Progressing Goal: Diagnostic test results will improve 08/18/2020 1050 by Felecia Jan, LPN Outcome: Progressing 08/18/2020 1049 by Meryl Dare A, LPN Outcome: Progressing Goal: Respiratory complications will improve 08/18/2020 1050 by Felecia Jan, LPN Outcome: Progressing 08/18/2020 1049 by Meryl Dare A, LPN Outcome: Progressing Goal: Cardiovascular complication will be avoided 08/18/2020 1050 by Felecia Jan, LPN Outcome: Progressing 08/18/2020 1049 by Felecia Jan, LPN Outcome: Progressing   Problem: Activity: Goal: Risk for activity intolerance will decrease 08/18/2020 1050 by Felecia Jan, LPN Outcome: Progressing 08/18/2020 1049 by Meryl Dare A, LPN Outcome: Progressing   Problem: Nutrition: Goal: Adequate nutrition will be maintained 08/18/2020 1050 by Felecia Jan, LPN Outcome: Progressing 08/18/2020 1049 by Meryl Dare A, LPN Outcome: Progressing   Problem: Coping: Goal: Level of anxiety will decrease 08/18/2020  1050 by Felecia Jan, LPN Outcome: Progressing 08/18/2020 1049 by Felecia Jan, LPN Outcome: Progressing   Problem: Elimination: Goal: Will not experience complications related to bowel motility 08/18/2020 1050 by Felecia Jan, LPN Outcome: Progressing 08/18/2020 1049 by Meryl Dare A, LPN Outcome: Progressing Goal: Will not experience complications related to urinary retention 08/18/2020 1050 by Felecia Jan, LPN Outcome: Progressing 08/18/2020 1049 by Felecia Jan, LPN Outcome: Progressing   Problem: Pain Managment: Goal: General experience of comfort will improve 08/18/2020 1050 by Felecia Jan, LPN Outcome: Progressing 08/18/2020 1049 by Felecia Jan, LPN Outcome: Progressing   Problem: Safety: Goal: Ability to remain free from injury will improve 08/18/2020 1050 by Felecia Jan, LPN Outcome: Progressing 08/18/2020 1049 by Meryl Dare A, LPN Outcome: Progressing   Problem: Skin Integrity: Goal: Risk for impaired skin integrity will decrease 08/18/2020 1050 by Felecia Jan, LPN Outcome: Progressing 08/18/2020 1049 by Felecia Jan, LPN Outcome: Progressing

## 2020-09-22 ENCOUNTER — Ambulatory Visit: Payer: Medicare HMO | Admitting: Internal Medicine

## 2020-11-02 ENCOUNTER — Encounter: Payer: Self-pay | Admitting: Internal Medicine

## 2020-11-02 ENCOUNTER — Other Ambulatory Visit: Payer: Self-pay

## 2020-11-02 ENCOUNTER — Ambulatory Visit (INDEPENDENT_AMBULATORY_CARE_PROVIDER_SITE_OTHER): Payer: Medicare Other | Admitting: Internal Medicine

## 2020-11-02 DIAGNOSIS — F17210 Nicotine dependence, cigarettes, uncomplicated: Secondary | ICD-10-CM | POA: Diagnosis not present

## 2020-11-02 DIAGNOSIS — J9611 Chronic respiratory failure with hypoxia: Secondary | ICD-10-CM | POA: Diagnosis not present

## 2020-11-02 DIAGNOSIS — J449 Chronic obstructive pulmonary disease, unspecified: Secondary | ICD-10-CM | POA: Insufficient documentation

## 2020-11-02 MED ORDER — BREZTRI AEROSPHERE 160-9-4.8 MCG/ACT IN AERO: 2.0000 | INHALATION_SPRAY | Freq: Two times a day (BID) | RESPIRATORY_TRACT | 0 refills | Status: DC

## 2020-11-02 MED ORDER — OMEPRAZOLE 40 MG PO CPDR: DELAYED_RELEASE_CAPSULE | ORAL | 11 refills | Status: DC

## 2020-11-02 MED ORDER — BREZTRI AEROSPHERE 160-9-4.8 MCG/ACT IN AERO: 2.0000 | INHALATION_SPRAY | Freq: Two times a day (BID) | RESPIRATORY_TRACT | 11 refills | Status: DC

## 2020-11-02 MED ORDER — FAMOTIDINE 20 MG PO TABS: ORAL_TABLET | ORAL | 11 refills | Status: DC

## 2020-11-02 NOTE — Patient Instructions (Addendum)
Plan A = Automatic = Always=    Breztri Take 2 puffs first thing in am and then another 2 puffs about 12 hours later.   Work on inhaler technique:  relax and gently blow all the way out then take a nice smooth deep breath back in, triggering the inhaler at same time you start breathing in.  Hold for up to 5 seconds if you can. Blow out thru nose. Rinse and gargle with water when done      Plan B = Backup (to supplement plan A, not to replace it) Only use your albuterol inhaler as a rescue medication to be used if you can't catch your breath by resting or doing a relaxed purse lip breathing pattern.  - The less you use it, the better it will work when you need it. - Ok to use the inhaler up to 2 puffs  every 4 hours if you must but call for appointment if use goes up over your usual need - Don't leave home without it !!  (think of it like the spare tire for your car)       Try prilosec 40mg   Take 30-60 min before first meal of the day and Pepcid ac (famotidine) 20 mg one after supper  Until return   GERD (REFLUX)  is an extremely common cause of respiratory symptoms just like yours , many times with no obvious heartburn at all.    It can be treated with medication, but also with lifestyle changes including elevation of the head of your bed (ideally with 6 -8inch blocks under the headboard of your bed),  Smoking cessation, avoidance of late meals, excessive alcohol, and avoid fatty foods, chocolate, peppermint, colas, red wine, and acidic juices such as orange juice.  NO MINT OR MENTHOL PRODUCTS SO NO COUGH DROPS  USE SUGARLESS CANDY INSTEAD (Jolley ranchers or Stover's or Life Savers) or even ice chips will also do - the key is to swallow to prevent all throat clearing. NO OIL BASED VITAMINS - use powdered substitutes.  Avoid fish oil when coughing.    Please schedule a follow up office visit in 6 weeks, call sooner if needed with all medications /inhalers/ solutions in hand so we can verify  exactly what you are taking. This includes all medications from all doctors and over the counters

## 2020-11-02 NOTE — Assessment & Plan Note (Signed)
02 x around 2019 by Dr Juanetta Gosling -  11/02/2020   Walked RA  approx   450 ft  @ mod pace  stopped due to  [employer] /fatigue with sats still 94%  rec as of 11/02/2020  = 2lpm hs and prn daytime with goal of keeping sats > 90%    Advised: Make sure you check your oxygen saturation  at your highest level of activity  to be sure it stays over 90% and adjust  02 flow upward to maintain this level if needed but remember to turn it back to previous settings when you stop (to conserve your supply).    Each maintenance medication was reviewed in detail including emphasizing most importantly the difference between maintenance and prns and under what circumstances the prns are to be triggered using an action plan format where appropriate.  Total time for H and P, chart review, counseling, reviewing hfa/02 device(s) , directly observing portions of ambulatory 02 saturation study/ and generating customized AVS unique to this office visit with pt new to me  / same day charting  = 52 min

## 2020-11-02 NOTE — Assessment & Plan Note (Signed)
Counseled re importance of smoking cessation but did not meet time criteria for separate billing   °

## 2020-11-02 NOTE — Addendum Note (Signed)
Addended by: Melonie Florida on: 11/02/2020 01:22 PM   Modules accepted: Orders

## 2020-11-02 NOTE — Assessment & Plan Note (Signed)
Active smoker -  PFT's  11/02/2020  FEV1 1.42 (85 % ) ratio 0.67  p 0 % improvement from saba p ? prior to study with DLCO  5.50 (31%) corrects to 1.55 (36%)  for alv volume and FV curve mild concavity   - 11/02/2020  After extensive coaching inhaler device,  effectiveness =    75% (short Ti, late Trigger) > try breztri 2bid and max gerd rx  DDX of  difficult airways management almost all start with A and  include Adherence, Ace Inhibitors, Acid Reflux, Active Sinus Disease, Alpha 1 Antitripsin deficiency, Anxiety masquerading as Airways dz,  ABPA,  Allergy(esp in young), Aspiration (esp in elderly), Adverse effects of meds,  Active smoking or vaping, A bunch of PE's (a small clot burden can't cause this syndrome unless there is already severe underlying pulm or vascular dz with poor reserve) plus two Bs  = Bronchiectasis and Beta blocker use..and one C= CHF  Adherence is always the initial "prime suspect" and is a multilayered concern that requires a "trust but verify" approach in every patient - starting with knowing how to use medications, especially inhalers, correctly, keeping up with refills and understanding the fundamental difference between maintenance and prns vs those medications only taken for a very short course and then stopped and not refilled.  - see hfa teaching - return with all meds in hand using a trust but verify approach to confirm accurate Medication  Reconciliation The principal here is that until we are certain that the  patients are doing what we've asked, it makes no sense to ask them to do more.   ? Acid (or non-acid) GERD > always difficult to exclude as up to 75% of pts in some series report no assoc GI/ Heartburn symptoms> rec max (24h)  acid suppression and diet restrictions/ reviewed and instructions given in writing.   ? Anxiety/depression/ deconditioning  > usually at the bottom of this list of usual suspects but should be much higher on this pt's based on H and P and  note already on psychotropics and may interfere with adherence and also interpretation of response or lack thereof to symptom management which can be quite subjective.   Adverse drug effects > none of the usual suspects listed   ? A bunch of PE's > Q scan low prob  ? chf > bnp nl during flare, prior echo with only G 1 diastolic dysfunction

## 2020-11-02 NOTE — Progress Notes (Signed)
..   Candace SawyersHester Harris, female    DOB: 26-Aug-1950    MRN: [id number]   Brief patient profile:  [age] yobf active smoker with GOLD I COPD by pfts 02/2019 [facility] 11/02/2020 p admit (see below)   From Dr Chestine Sporelark 11/24/19 70 year old woman with a history of tobacco abuse and COPD who presents for evaluation of dyspnea on exertion.   She was diagnosed with COPD about 2 years ago but has had progressively worsening shortness of breath over the last 2 years.  She also has cough, sputum production, wheezing, but dyspnea on exertion is her main complaint.  Her activity is fairly limited; she is only able to walk a few steps around her house without stopping.   She is on 3 L supplemental oxygen at home.  She was prescribed 2.5 L but had ongoing dyspnea on titrated up to 3 L.  Her home saturations are usually in the 90s, but sometimes drop into the 80s.  She is currently using her Trelegy inhaler daily with frequent albuterol.  She continues to smoke 0.25- 0.5 packs/day; she has smoked 0.5 ppd for the last 50 years.     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  Rec: trelegy Check echo: 12/15/19  G I diastolic dysfunction / no cor pulmonale    Stopped trelegy  mid Jan 2022 due irritated mouth   Admit date: 08/15/2020 Discharge date: 08/18/2020 Brief Hospitalization Summary: Please see all hospital notes, images, labs for full details of the hospitalization. ADMISSION WUJ:[id number]:Candace Loweis a 70 y.o.femalewith medical history significant forHTN,CAD, anxiety, COPD (on 3-6 L of O2 at home), T2DM, GERD and bipolar disorder who presents to the emergency department due to 663-monthonset of shortness of breath that has been progressively worsening,she complained of decreased oral intake due to loss of taste, she also endorsed loss of smell and she states that she had diarrhea for 2 to 3 weeks last month which hasresolved. She [place],but she has not yet filled the prescription.She decided to go to the ED for further evaluation due to persistent worsening shortness of breath and concern for dehydration.  ED Course: In the emergency department, she wastachycardic andintermittently tachypneic. Work-up in the ED showed leukocytosis, thrombocytosis, hypokalemia, hyponatremia, BUN/creatinine 41/1.70 (baseline creatinine was 0.7). D-dimer 0.78. Chest x-ray was reflective of emphysema but showed no active cardiopulmonary disease Breathing treatment was provided, Solu-Medrol 125 Mg x1 was given, IV Ativan Librium x1 due to anxiety was given and patient was provided with 500 mL of IV NS. Hospitalist was asked to admit patient for further evaluation and management.  Hospital Course   1. Acute on chronic respiratory failure with hypoxia - secondary to COPD exacerbation. Pt much improved after IV steroids, bronchodilators and supportive measures. .  Prednisone taper.   2. Hypomagnesemia - IV replacement given and repleted.  3. Hypokalemia - repleted.  4. Essential hypertension - continue home meds 5. GERD - protonix for GI protection.  6. Type 2 DM - monitored and stable.   7. AKI - resolved after hydration.   Discharge Diagnoses:  Principal Problem:   Shortness of breath Active Problems:   AKI (acute kidney injury) (HCC)   Dehydration   Leukocytosis   Thrombocytosis   Essential hypertension   Elevated d-dimer   Hypokalemia   Anxiety   COPD with acute exacerbation (HCC)   Hyperlipidemia   Diabetes mellitus (HCC)   GERD (gastroesophageal  reflux disease)   CAD (coronary artery disease)       History of Present Illness  11/02/2020  Pulmonary/ 1st office eval/ [facility] / [place] [facility] GOLD II copd with component of UACS/ anxiety  Chief Complaint  Patient presents with  . Follow-up    Productive cough with white phlegm since January 2022  Dyspnea:  Can do 17 steps at appt complex but has to  stop at top to recover / last shopping x 2 y prior to OV   Cough: hocking min white daytime off gerd rx  Sleep: on side flat bed s resp symptoms "as long as I have on my oxygen"  SABA use: avg 6-8 x per day / no maint rx  02 2lpm 24/7      No obvious day to day or daytime variability or assoc excess/ purulent sputum or mucus plugs or hemoptysis or cp or chest tightness, subjective wheeze or overt sinus or hb symptoms.   Sleeping  without nocturnal  or early am exacerbation  of respiratory  c/o's or need for noct saba. Also denies any obvious fluctuation of symptoms with weather or environmental changes or other aggravating or alleviating factors except as outlined above   No unusual exposure hx or h/o childhood pna/ asthma or knowledge of premature birth.  Current Allergies, Complete Past Medical History, Past Surgical History, Family History, and Social History were reviewed in [employer] record.  ROS  The following are not active complaints unless bolded Hoarseness, sore throat, dysphagia, dental problems, itching, sneezing,  nasal congestion or discharge of excess mucus or purulent secretions, ear ache,   fever, chills, sweats, unintended wt loss or wt gain, classically pleuritic or exertional cp,  orthopnea pnd or arm/hand swelling  or leg swelling, presyncope, palpitations, abdominal pain, anorexia, nausea, vomiting, diarrhea  or change in bowel habits or change in bladder habits, change in stools or change in urine, dysuria, hematuria,  rash, arthralgias, visual complaints, headache, numbness, weakness or ataxia or problems with walking or coordination,  change in mood or  memory.           Past Medical History:  Diagnosis Date  . Anxiety   . Bipolar affective disorder (HCC)   . CAD (coronary artery disease)   . Chronic respiratory failure (HCC)   . COPD (chronic obstructive pulmonary disease) (HCC)   . DM (diabetes mellitus) (HCC)   . GERD (gastroesophageal  reflux disease)   . HTN (hypertension)   . Polymyalgia rheumatica (HCC)     Outpatient Medications Prior to Visit  Medication Sig Dispense Refill  . albuterol (VENTOLIN HFA) 108 (90 Base) MCG/ACT inhaler Inhale 2 puffs into the lungs every 4 (four) hours as needed for wheezing or shortness of breath. for wheezing 8 g 2  . amLODipine (NORVASC) 2.5 MG tablet TAKE 1 TABLET EVERY DAY    . cyclobenzaprine (FLEXERIL) 10 MG tablet Take 1 tablet by mouth 2 (two) times daily.    Marland Kitchen dicyclomine (BENTYL) 20 MG tablet Take 20 mg by mouth 4 (four) times daily.    . DULoxetine (CYMBALTA) 30 MG capsule Take 30 mg by mouth daily.    Marland Kitchen HYDROcodone-acetaminophen (NORCO) 10-325 MG tablet Take 1 tablet by mouth 4 (four) times daily as needed.    . Ibuprofen (ADVIL PO) Take 500 mg by mouth daily as needed.    . metFORMIN (GLUCOPHAGE) 500 MG tablet TAKE 1 TABLET TWICE DAILY WITH MEALS    . nitroGLYCERIN (NITROSTAT) 0.4 MG  SL tablet Place 0.4 mg under the tongue.    Marland Kitchen omeprazole (PRILOSEC) 40 MG capsule Take 40 mg by mouth every morning.    . ondansetron (ZOFRAN) 4 MG tablet Take 4 mg by mouth daily.    . rosuvastatin (CRESTOR) 20 MG tablet Take 20 mg by mouth at bedtime.    Marland Kitchen XTAMPZA ER 9 MG C12A Take 1 capsule by mouth 2 (two) times daily.    .       .    0  .    0   No facility-administered medications prior to visit.     Objective:     BP 130/80 (BP Location: Left Arm, Cuff Size: Normal)   Pulse (!) 103   Temp (!) 97.3 F (36.3 C) (Other (Comment)) Comment (Src): wrist  Ht 5\' 1"  (1.549 m)   Wt 112 lb 12.8 oz (51.2 kg)   SpO2 100% Comment: 2 Liters Pulse O2  BMI 21.31 kg/m   SpO2: 100 % (2 Liters Pulse O2) O2 Type: Pulse O2 O2 Flow Rate (L/min): 2 L/min   amb bf nad  HEENT : pt wearing mask not removed for exam due to covid - 19 concerns.   NECK :  without JVD/Nodes/TM/ nl carotid upstrokes bilaterally   LUNGS: no acc muscle use,  Min barrel  contour chest wall with bilateral  slightly  decreased bs s audible wheeze and  without cough on insp or exp maneuvers and min  Hyperresonant  to  percussion bilaterally     CV:  RRR  no s3 or murmur or increase in P2, and no edema   ABD:  soft and nontender with pos end  insp Hoover's  in the supine position. No bruits or organomegaly appreciated, bowel sounds nl  MS:   Nl gait/  ext warm without deformities, calf tenderness, cyanosis - Moderate clubbing No obvious joint restrictions   SKIN: warm and dry without lesions    NEURO:  alert, approp, nl sensorium with  no motor or cerebellar deficits apparent.        I personally reviewed images and agree with radiology impression as follows:  CXR:   Portable 08/15/20 1. No active cardiopulmonary disease. 2. Emphysema.  V/Q only was Q due to covid 08/16/20 Low prob    Assessment   COPD GOLD 1  Active smoker -  PFT's  11/02/2020  FEV1 1.42 (85 % ) ratio 0.67  p 0 % improvement from saba p ? prior to study with DLCO  5.50 (31%) corrects to 1.55 (36%)  for alv volume and FV curve mild concavity   - 11/02/2020  After extensive coaching inhaler device,  effectiveness =    75% (short Ti, late Trigger) > try breztri 2bid and max gerd rx  DDX of  difficult airways management almost all start with A and  include Adherence, Ace Inhibitors, Acid Reflux, Active Sinus Disease, Alpha 1 Antitripsin deficiency, Anxiety masquerading as Airways dz,  ABPA,  Allergy(esp in young), Aspiration (esp in elderly), Adverse effects of meds,  Active smoking or vaping, A bunch of PE's (a small clot burden can't cause this syndrome unless there is already severe underlying pulm or vascular dz with poor reserve) plus two Bs  = Bronchiectasis and Beta blocker use..and one C= CHF  Adherence is always the initial "prime suspect" and is a multilayered concern that requires a "trust but verify" approach in every patient - starting with knowing how to use medications, especially inhalers, correctly, keeping  up with refills  and understanding the fundamental difference between maintenance and prns vs those medications only taken for a very short course and then stopped and not refilled.  - see hfa teaching - return with all meds in hand using a trust but verify approach to confirm accurate Medication  Reconciliation The principal here is that until we are certain that the  patients are doing what we've asked, it makes no sense to ask them to do more.   ? Acid (or non-acid) GERD > always difficult to exclude as up to 75% of pts in some series report no assoc GI/ Heartburn symptoms> rec max (24h)  acid suppression and diet restrictions/ reviewed and instructions given in writing.   ? Anxiety/depression/ deconditioning  > usually at the bottom of this list of usual suspects but should be much higher on this pt's based on H and P and note already on psychotropics and may interfere with adherence and also interpretation of response or lack thereof to symptom management which can be quite subjective.   Adverse drug effects > none of the usual suspects listed   ? A bunch of PE's > Q scan low prob  ? chf > bnp nl during flare, prior echo with only G 1 diastolic dysfunction      Chronic respiratory failure with hypoxia (HCC) 02 x around 2019 by Dr Juanetta Gosling -  11/02/2020   Walked RA  approx   450 ft  @ mod pace  stopped due to  [employer] /fatigue with sats still 94%  rec as of 11/02/2020  = 2lpm hs and prn daytime with goal of keeping sats > 90%    Advised: Make sure you check your oxygen saturation  at your highest level of activity  to be sure it stays over 90% and adjust  02 flow upward to maintain this level if needed but remember to turn it back to previous settings when you stop (to conserve your supply).    Cigarette smoker Counseled re importance of smoking cessation but did not meet time criteria for separate billing    Each maintenance medication was reviewed in detail including emphasizing most importantly the  difference between maintenance and prns and under what circumstances the prns are to be triggered using an action plan format where appropriate.  Total time for H and P, chart review, counseling, reviewing hfa/02 device(s) , directly observing portions of ambulatory 02 saturation study/ and generating customized AVS unique to this office visit with pt new to me  / same day charting  = 52 min           Sandrea Hughs, MD 11/02/2020

## 2020-11-07 ENCOUNTER — Other Ambulatory Visit (HOSPITAL_COMMUNITY): Payer: Self-pay | Admitting: Family

## 2020-11-07 DIAGNOSIS — Z1231 Encounter for screening mammogram for malignant neoplasm of breast: Secondary | ICD-10-CM

## 2020-11-13 ENCOUNTER — Ambulatory Visit (HOSPITAL_COMMUNITY)
Admission: RE | Admit: 2020-11-13 | Discharge: 2020-11-13 | Disposition: A | Discharge status: Home / Self Care | Payer: Medicare Other | Source: Ambulatory Visit | Attending: Family | Admitting: Family

## 2020-11-13 ENCOUNTER — Other Ambulatory Visit: Payer: Self-pay

## 2020-11-13 DIAGNOSIS — Z1231 Encounter for screening mammogram for malignant neoplasm of breast: Secondary | ICD-10-CM | POA: Diagnosis present

## 2020-12-14 ENCOUNTER — Other Ambulatory Visit: Payer: Self-pay

## 2020-12-14 ENCOUNTER — Ambulatory Visit (INDEPENDENT_AMBULATORY_CARE_PROVIDER_SITE_OTHER): Payer: Self-pay | Admitting: Internal Medicine

## 2020-12-14 ENCOUNTER — Encounter: Payer: Self-pay | Admitting: Internal Medicine

## 2020-12-14 DIAGNOSIS — F17210 Nicotine dependence, cigarettes, uncomplicated: Secondary | ICD-10-CM

## 2020-12-14 DIAGNOSIS — J449 Chronic obstructive pulmonary disease, unspecified: Secondary | ICD-10-CM

## 2020-12-14 DIAGNOSIS — J9611 Chronic respiratory failure with hypoxia: Secondary | ICD-10-CM

## 2020-12-14 NOTE — Progress Notes (Signed)
..   Candace Harris, female    DOB: 03/08/1951    MRN: [id number]   Brief patient profile:  [phone] active smoker with GOLD I COPD by pfts 02/2019 transferred care to [facility] office 11/02/2020 p admit (see below)   From Dr Chestine Spore 11/24/19 70 year old woman with a history of tobacco abuse and COPD who presents for evaluation of dyspnea on exertion.   She was diagnosed with COPD about 2 years ago but has had progressively worsening shortness of breath over the last 2 years.  She also has cough, sputum production, wheezing, but dyspnea on exertion is her main complaint.  Her activity is fairly limited; she is only able to walk a few steps around her house without stopping.   She is on 3 L supplemental oxygen at home.  She was prescribed 2.5 L but had ongoing dyspnea on titrated up to 3 L.  Her home saturations are usually in the 90s, but sometimes drop into the 80s.  She is currently using her Trelegy inhaler daily with frequent albuterol.  She continues to smoke 0.25- 0.5 packs/day; she has smoked 0.5 ppd for the last 50 years.     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  Rec: trelegy Check echo: 12/15/19  G I diastolic dysfunction / no cor pulmonale    Stopped trelegy  mid Jan 2022 due irritated mouth   Admit date: 08/15/2020 Discharge date: 08/18/2020 Brief Hospitalization Summary: Please see all hospital notes, images, labs for full details of the hospitalization. ADMISSION PPI:[name] a 70 y.o.femalewith medical history significant forHTN,CAD, anxiety, COPD (on 3-6 L of O2 at home), T2DM, GERD and bipolar disorder who presents to the emergency department due to 27-monthonset of shortness of breath that has been progressively worsening,she complained of decreased oral intake due to loss of taste, she also endorsed loss of smell and she states that she had diarrhea for 2 to 3 weeks last month which hasresolved. She [place],but she has not yet filled the prescription.She decided to go to the ED for further evaluation due to persistent worsening shortness of breath and concern for dehydration.  ED Course: In the emergency department, she wastachycardic andintermittently tachypneic. Work-up in the ED showed leukocytosis, thrombocytosis, hypokalemia, hyponatremia, BUN/creatinine 41/1.70 (baseline creatinine was 0.7). D-dimer 0.78. Chest x-ray was reflective of emphysema but showed no active cardiopulmonary disease Breathing treatment was provided, Solu-Medrol 125 Mg x1 was given, IV Ativan Librium x1 due to anxiety was given and patient was provided with 500 mL of IV NS. Hospitalist was asked to admit patient for further evaluation and management.  Hospital Course   1. Acute on chronic respiratory failure with hypoxia - secondary to COPD exacerbation. Pt much improved after IV steroids, bronchodilators and supportive measures. .  Prednisone taper.   2. Hypomagnesemia - IV replacement given and repleted.  3. Hypokalemia - repleted.  4. Essential hypertension - continue home meds 5. GERD - protonix for GI protection.  6. Type 2 DM - monitored and stable.   7. AKI - resolved after hydration.   Discharge Diagnoses:  Principal Problem:   Shortness of breath Active Problems:   AKI (acute kidney injury) (HCC)   Dehydration   Leukocytosis   Thrombocytosis   Essential hypertension   Elevated d-dimer   Hypokalemia   Anxiety   COPD with acute exacerbation (HCC)   Hyperlipidemia   Diabetes mellitus (HCC)   GERD (gastroesophageal reflux  disease)   CAD (coronary artery disease)       History of Present Illness  11/02/2020  Pulmonary/ 1st office eval/ [facility] / [place] [facility] GOLD I copd with component of UACS/ anxiety  Chief Complaint  Patient presents with  . Follow-up    Productive cough with white phlegm since January 2022  Dyspnea:  Can do 17 steps at appt complex but has to  stop at top to recover / last shopping x 2 y prior to OV   Cough: hocking min white daytime off gerd rx  Sleep: on side flat bed s resp symptoms "as long as I have on my oxygen"  SABA use: avg 6-8 x per day / no maint rx  02 2lpm 24/7 rec Plan A = Automatic = Always=    Breztri Take 2 puffs first thing in am and then another 2 puffs about 12 hours later.  Work on inhaler technique: Plan B = Backup (to supplement plan A, not to replace it) Only use your albuterol inhaler as a rescue medication Try prilosec 40mg   Take 30-60 min before first meal of the day and Pepcid ac (famotidine) 20 mg one after supper  Until return  GERD diet  Please schedule a follow up office visit in 6 weeks, call sooner if needed with all medications /inhalers/ solutions in hand so we can verify exactly what you are taking. This includes all medications from all doctors and over the counters        12/14/2020  f/u ov/[place] office/Izayiah Tibbitts re: GOLD I copd with component of UACS/ anxiety / active smoker on breztri 2bid  Chief Complaint  Patient presents with  . Follow-up    Productive cough with clear phlegm  Dyspnea:  Can do housecleaning x 30 min while on 2lpm  Cough: mostly in am's /clear mucus  Sleeping: ok flat bed with a lot of pillows s resp cc SABA use: too much neb / did not bring with other meds  02: 2lpm 24/7  Covid status: never vaccinated      No obvious day to day or daytime variability or assoc   purulent sputum or mucus plugs or hemoptysis or cp or chest tightness, subjective wheeze or overt sinus or hb symptoms.     Also denies any obvious fluctuation of symptoms with weather or environmental changes or other aggravating or alleviating factors except as outlined above   No unusual exposure hx or h/o childhood pna/ asthma or knowledge of premature birth.  Current Allergies, Complete Past Medical History, Past Surgical History, Family History, and Social History were reviewed in [facility]  electronic medical record.  ROS  The following are not active complaints unless bolded Hoarseness, sore throat, dysphagia, dental problems, itching, sneezing,  nasal congestion or discharge of excess mucus or purulent secretions, ear ache,   fever, chills, sweats, unintended wt loss or wt gain, classically pleuritic or exertional cp,  orthopnea pnd or arm/hand swelling  or leg swelling, presyncope, palpitations, abdominal pain, anorexia, nausea, vomiting, diarrhea  or change in bowel habits or change in bladder habits, change in stools or change in urine, dysuria, hematuria,  rash, arthralgias, visual complaints, headache, numbness, weakness or ataxia or problems with walking or coordination,  change in mood or  memory.        Current Meds  Medication Sig  . albuterol (VENTOLIN HFA) 108 (90 Base) MCG/ACT inhaler Inhale 2 puffs into the lungs every 4 (four) hours as needed for wheezing or shortness of  breath. for wheezing  . amLODipine (NORVASC) 2.5 MG tablet TAKE 1 TABLET EVERY DAY  . ARIPiprazole (ABILIFY) 5 MG tablet Take 5 mg by mouth daily.  . Budeson-Glycopyrrol-Formoterol (BREZTRI AEROSPHERE) 160-9-4.8 MCG/ACT AERO Inhale 2 puffs into the lungs 2 (two) times daily.  . busPIRone (BUSPAR) 5 MG tablet Take 5 mg by mouth 2 (two) times daily.  . cyclobenzaprine (FLEXERIL) 10 MG tablet Take 1 tablet by mouth 2 (two) times daily.  Marland Kitchen dicyclomine (BENTYL) 20 MG tablet Take 20 mg by mouth 4 (four) times daily.  . famotidine (PEPCID) 20 MG tablet One after supper  . HYDROcodone-acetaminophen (NORCO) 7.5-325 MG tablet Take 1 tablet by mouth every 6 (six) hours as needed for moderate pain.  . Ibuprofen (ADVIL PO) Take 500 mg by mouth daily as needed.  . metFORMIN (GLUCOPHAGE) 500 MG tablet TAKE 1 TABLET TWICE DAILY WITH MEALS  . nitroGLYCERIN (NITROSTAT) 0.4 MG SL tablet Place 0.4 mg under the tongue.  Marland Kitchen omeprazole (PRILOSEC) 40 MG capsule Take 30-60 min before first meal of the day  . ondansetron  (ZOFRAN) 4 MG tablet Take 4 mg by mouth daily.  . rosuvastatin (CRESTOR) 20 MG tablet Take 20 mg by mouth at bedtime.  . sertraline (ZOLOFT) 100 MG tablet Take 100 mg by mouth daily.  . [DISCONTINUED] Budeson-Glycopyrrol-Formoterol (BREZTRI AEROSPHERE) 160-9-4.8 MCG/ACT AERO Inhale 2 puffs into the lungs in the morning and at bedtime.  . [DISCONTINUED] DULoxetine (CYMBALTA) 30 MG capsule Take 30 mg by mouth daily.  . [DISCONTINUED] HYDROcodone-acetaminophen (NORCO) 10-325 MG tablet Take 1 tablet by mouth 4 (four) times daily as needed.             Past Medical History:  Diagnosis Date  . Anxiety   . Bipolar affective disorder (HCC)   . CAD (coronary artery disease)   . Chronic respiratory failure (HCC)   . COPD (chronic obstructive pulmonary disease) (HCC)   . DM (diabetes mellitus) (HCC)   . GERD (gastroesophageal reflux disease)   . HTN (hypertension)   . Polymyalgia rheumatica (HCC)           Objective:    Wt Readings from Last 3 Encounters:  12/14/20 107 lb 12.8 oz (48.9 kg)  11/02/20 112 lb 12.8 oz (51.2 kg)  08/15/20 132 lb 4.4 oz (60 kg)      Vital signs reviewed  12/14/2020  - Note at rest 02 sats  98% on 2lpm    General appearance:    amb bf nad / uses 2 wheeled walker due to balance     HEENT : pt wearing mask not removed for exam due to covid - 19 concerns.    NECK :  without JVD/Nodes/TM/ nl carotid upstrokes bilaterally   LUNGS: no acc muscle use,  Mild barrel  contour chest wall with bilateral  Distant bs s audible wheeze and  without cough on insp or exp maneuvers  and mild  Hyperresonant  to  percussion bilaterally     CV:  RRR  no s3 or murmur or increase in P2, and no edema   ABD:  soft and nontender with pos end  insp Hoover's  in the supine position. No bruits or organomegaly appreciated, bowel sounds nl  MS:   Nl gait/  ext warm without deformities, calf tenderness, cyanosis  - mild/moderate clubbing No obvious joint restrictions   SKIN: warm  and dry without lesions    NEURO:  alert, approp, nl sensorium with  no motor or cerebellar  deficits apparent.                 Assessment

## 2020-12-14 NOTE — Patient Instructions (Addendum)
Plan A = Automatic = Always=    Breztri Take 2 puffs first thing in am and then another 2 puffs about 12 hours later.     Work on inhaler technique:  relax and gently blow all the way out then take a nice smooth full deep breath back in, triggering the inhaler at same time you start breathing in.  Hold for up to 5 seconds if you can. Blow out thru nose. Rinse and gargle with water when done.  If mouth or throat bother you at all,  try brushing teeth/gums/tongue with arm and hammer toothpaste/ make a slurry and gargle and spit out.      Plan B = Backup (to supplement plan A, not to replace it) Only use your albuterol inhaler(PROAIR)  as a rescue medication to be used if you can't catch your breath by resting or doing a relaxed purse lip breathing pattern.  - The less you use it, the better it will work when you need it. - Ok to use the inhaler up to 2 puffs  every 4 hours if you must but call for appointment if use goes up over your usual need - Don't leave home without it !!  (think of it like the spare tire for your car)   Plan C = Crisis (instead of Plan B but only if Plan B stops working) - only use your albuterol nebulizer if you first try Plan B and it fails to help > ok to use the nebulizer up to every 4 hours but if start needing it regularly call for immediate appointment   Please schedule a follow up visit in 3 months but call sooner if needed  with all  inhalers/ solutions in hand so we can verify exactly what you are taking. This includes all medications from all doctors and over the counters

## 2020-12-14 NOTE — Assessment & Plan Note (Signed)
02 x around 2019 by Dr Juanetta Gosling -  11/02/2020   Walked RA  approx   450 ft  @ mod pace  stopped due to  [employer] /fatigue with sats still 94%  rec as of 12/14/2020  = 2lpm hs and prn daytime with goal of keeping sats > 90%

## 2020-12-14 NOTE — Assessment & Plan Note (Addendum)
Active smoker -  PFT's  11/02/2020  FEV1 1.42 (85 % ) ratio 0.67  p 0 % improvement from saba p ? prior to study with DLCO  5.50 (31%) corrects to 1.55 (36%)  for alv volume and FV curve mild concavity   - 11/02/2020  try breztri 2bid and max gerd rx   - 12/14/2020  After extensive coaching inhaler device,  effectiveness =    80% (short ti ) from a baseline of < 50%    Group D in terms of symptom/risk and laba/lama/ICS  therefore appropriate rx at this point >>>  breztri 2 bid and appropriate saba (way overusing at baseline)   Re saba: I spent extra time with pt today reviewing appropriate use of albuterol for prn use on exertion with the following points: 1) saba is for relief of sob that does not improve by walking a slower pace or resting but rather if the pt does not improve after trying this first. 2) If the pt is convinced, as many are, that saba helps recover from activity faster then it's easy to tell if this is the case by re-challenging : ie stop, take the inhaler, then p 5 minutes try the exact same activity (intensity of workload) that just caused the symptoms and see if they are substantially diminished or not after saba 3) if there is an activity that reproducibly causes the symptoms, try the saba 15 min before the activity on alternate days   If in fact the saba really does help, then fine to continue to use it prn but advised may need to look closer at the maintenance regimen being used to achieve better control of airways disease with exertion.

## 2020-12-14 NOTE — Assessment & Plan Note (Signed)
Counseled re importance of smoking cessation but did not meet time criteria for separate billing           Each maintenance medication was reviewed in detail including emphasizing most importantly the difference between maintenance and prns and under what circumstances the prns are to be triggered using an action plan format where appropriate.  Total time for H and P, chart review, counseling, reviewing 02/ hfa  device(s) and generating customized AVS unique to this office visit / same day charting = 35 min

## 2021-03-15 ENCOUNTER — Encounter: Payer: Self-pay | Admitting: Internal Medicine

## 2021-03-15 ENCOUNTER — Ambulatory Visit (INDEPENDENT_AMBULATORY_CARE_PROVIDER_SITE_OTHER): Payer: Medicare Other | Admitting: Internal Medicine

## 2021-03-15 ENCOUNTER — Other Ambulatory Visit: Payer: Self-pay

## 2021-03-15 DIAGNOSIS — J449 Chronic obstructive pulmonary disease, unspecified: Secondary | ICD-10-CM | POA: Diagnosis not present

## 2021-03-15 DIAGNOSIS — J9611 Chronic respiratory failure with hypoxia: Secondary | ICD-10-CM | POA: Diagnosis not present

## 2021-03-15 DIAGNOSIS — R0600 Dyspnea, unspecified: Secondary | ICD-10-CM | POA: Diagnosis not present

## 2021-03-15 DIAGNOSIS — F17210 Nicotine dependence, cigarettes, uncomplicated: Secondary | ICD-10-CM | POA: Diagnosis not present

## 2021-03-15 DIAGNOSIS — R0609 Other forms of dyspnea: Secondary | ICD-10-CM

## 2021-03-15 NOTE — Patient Instructions (Addendum)
Plan A = Automatic = Always=    breztri Take 2 puffs first thing in am and then another 2 puffs about 12 hours later.   Work on inhaler technique:  relax and gently blow all the way out then take a nice smooth full deep breath back in, triggering the inhaler at same time you start breathing in.  Hold for up to 5 seconds if you can. Blow out thru nose. Rinse and gargle with water when done.  If mouth or throat bother you at all,  try brushing teeth/gums/tongue with arm and hammer toothpaste/ make a slurry and gargle and spit out.       Plan B = Backup (to supplement plan A, not to replace it) Only use your albuterol inhaler as a rescue medication to be used if you can't catch your breath by resting or doing a relaxed purse lip breathing pattern.  - The less you use it, the better it will work when you need it. - Ok to use the inhaler up to 2 puffs  every 4 hours if you must but call for appointment if use goes up over your usual need - Don't leave home without it !!  (think of it like the spare tire for your car)   Plan C = Crisis (instead of Plan B but only if Plan B stops working) - only use your albuterol nebulizer if you first try Plan B and it fails to help > ok to use the nebulizer up to every 4 hours but if start needing it regularly call for immediate appointment   Please remember to go to the lab department  for your tests - we will call you with the results when they are available.  Make sure you check your oxygen saturation  at your highest level of activity  to be sure it stays over 90% and adjust  02 flow upward to maintain this level if needed but remember to turn it back to previous settings when you stop (to conserve your supply). WALK SLOWER if you want to avoid 02 need       Please schedule a follow up visit in 6 months but call sooner if needed

## 2021-03-15 NOTE — Assessment & Plan Note (Signed)
Counseled re importance of smoking cessation but did not meet time criteria for separate billing     Each maintenance medication was reviewed in detail including emphasizing most importantly the difference between maintenance and prns and under what circumstances the prns are to be triggered using an action plan format where appropriate.  Total time for H and P, chart review, counseling, reviewing hfa/POC/ neb device(s) , directly observing portions of ambulatory 02 saturation study/ and generating customized AVS unique to this office visit / same day charting > 30 min

## 2021-03-15 NOTE — Assessment & Plan Note (Signed)
Active smoker -  PFT's  11/02/2020  FEV1 1.42 (85 % ) ratio 0.67  p 0 % improvement from saba p ? prior to study with DLCO  5.50 (31%) corrects to 1.55 (36%)  for alv volume and FV curve mild concavity   - 11/02/2020  try breztri 2bid and max gerd rx   - 03/15/2021  After extensive coaching inhaler device,  effectiveness =    75% (short Ti)   Group D in terms of symptom/risk and laba/lama/ICS  therefore appropriate rx at this point >>> breztri and approp saba  I spent extra time with pt today reviewing appropriate use of albuterol for prn use on exertion with the following points: 1) saba is for relief of sob that does not improve by walking a slower pace or resting but rather if the pt does not improve after trying this first. 2) If the pt is convinced, as many are, that saba helps recover from activity faster then it's easy to tell if this is the case by re-challenging : ie stop, take the inhaler, then p 5 minutes try the exact same activity (intensity of workload) that just caused the symptoms and see if they are substantially diminished or not after saba 3) if there is an activity that reproducibly causes the symptoms, try the saba 15 min before the activity on alternate days   If in fact the saba really does help, then fine to continue to use it prn but advised may need to look closer at the maintenance regimen being used to achieve better control of airways disease with exertion.

## 2021-03-15 NOTE — Assessment & Plan Note (Signed)
02 x around 2019 by Dr Juanetta Gosling -  11/02/2020   Walked RA  approx   450 ft  @ mod pace  stopped due to  [employer] /fatigue with sats still 94% - 03/15/2021   Walked on RA x  3  lap(s) =  approx 450  @ slow to mod pace, stopped due to end of study  with lowest 02 sats 94%    rec as of 03/15/2021  = 2lpm hs and prn daytime with goal of keeping sats > 90%

## 2021-03-15 NOTE — Assessment & Plan Note (Addendum)
Labs 03/15/2021   TSH BNP and bmet/cbc > wnl x wbc 19 with L shift > cxr ordered

## 2021-03-15 NOTE — Progress Notes (Signed)
..   Candace Harris, female    DOB: 17-Mar-1951    MRN: [id number]   Brief patient profile:  46 yobf active smoker with GOLD I COPD by pfts 02/2019 transferred care to [facility] office 11/02/2020 p admit (see below)   From Dr Chestine Spore 11/24/19 70 year old woman with a history of tobacco abuse and COPD who presents for evaluation of dyspnea on exertion.   She was diagnosed with COPD about 2 years ago but has had progressively worsening shortness of breath over the last 2 years.  She also has cough, sputum production, wheezing, but dyspnea on exertion is her main complaint.  Her activity is fairly limited; she is only able to walk a few steps around her house without stopping.   She is on 3 L supplemental oxygen at home.  She was prescribed 2.5 L but had ongoing dyspnea on titrated up to 3 L.  Her home saturations are usually in the 90s, but sometimes drop into the 80s.  She is currently using her Trelegy inhaler daily with frequent albuterol.  She continues to smoke 0.25- 0.5 packs/day; she has smoked 0.5 ppd for the last 50 years.     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  Rec: trelegy Check echo: 12/15/19  G I diastolic dysfunction / no cor pulmonale    Stopped trelegy  mid Jan 2022 due irritated mouth   Admit date: 08/15/2020 Discharge date: 08/18/2020 Brief Hospitalization Summary: Please see all hospital notes, images, labs for full details of the hospitalization. ADMISSION HPI: Candace Harris is a 70 y.o. female with medical history significant for HTN, CAD, anxiety, COPD (on 3-6 L of O2 at home), T2DM, GERD and bipolar disorder who presents to the emergency department due to 80-month onset of shortness of breath that has been progressively worsening, she complained of decreased oral intake due to loss of taste, she also endorsed loss of smell and she states that she had diarrhea for 2 to 3 weeks last month which has resolved.  She states that she saw her PCP in [place] who prescribed  prednisone for her, but she has not yet filled the prescription.  She decided to go to the ED for further evaluation due to persistent worsening shortness of breath and concern for dehydration.   ED Course: In the emergency department, she was tachycardic and intermittently tachypneic. Work-up in the ED showed leukocytosis, thrombocytosis, hypokalemia, hyponatremia, BUN/creatinine 41/1.70 (baseline creatinine was 0.7). D-dimer 0.78. Chest x-ray was reflective of emphysema but showed no active cardiopulmonary disease Breathing treatment was provided, Solu-Medrol 125 Mg x1 was given, IV Ativan Librium x1 due to anxiety was given and patient was provided with 500 mL of IV NS. Hospitalist was asked to admit patient for further evaluation and management.   Hospital Course    Acute on chronic respiratory failure with hypoxia - secondary to COPD exacerbation.  Pt much improved after IV steroids, bronchodilators and supportive measures. .  Prednisone taper.   Hypomagnesemia - IV replacement given and repleted.  Hypokalemia - repleted.  Essential hypertension - continue home meds GERD - protonix for GI protection.  Type 2 DM - monitored and stable.   AKI - resolved after hydration.    Discharge Diagnoses:  Principal Problem:   Shortness of breath Active Problems:   AKI (acute kidney injury) (HCC)   Dehydration   Leukocytosis   Thrombocytosis   Essential hypertension   Elevated d-dimer   Hypokalemia   Anxiety  COPD with acute exacerbation (HCC)   Hyperlipidemia   Diabetes mellitus (HCC)   GERD (gastroesophageal reflux disease)   CAD (coronary artery disease)        History of Present Illness  11/02/2020  Pulmonary/ 1st office eval/ [facility] GOLD I copd with component of UACS/ anxiety  Chief Complaint  Patient presents with   Follow-up    Productive cough with white phlegm since January 2022  Dyspnea:  Can do 17 steps at appt complex but has to stop at top to recover  / last shopping x 2 y prior to OV   Cough: hocking min white daytime off gerd rx  Sleep: on side flat bed s resp symptoms "as long as I have on my oxygen"  SABA use: avg 6-8 x per day / no maint rx  02 2lpm 24/7 rec Plan A = Automatic = Always=    Breztri Take 2 puffs first thing in am and then another 2 puffs about 12 hours later.  Work on inhaler technique: Plan B = Backup (to supplement plan A, not to replace it) Only use your albuterol inhaler as a rescue medication Try prilosec 40mg   Take 30-60 min before first meal of the day and Pepcid ac (famotidine) 20 mg one after supper  Until return  GERD diet  Please schedule a follow up office visit in 6 weeks, call sooner if needed with all medications /inhalers/ solutions in hand so we can verify exactly what you are taking. This includes all medications from all doctors and over the counters        12/14/2020  f/u ov/[name] re: GOLD I copd with component of UACS/ anxiety / active smoker on breztri 2bid  Chief Complaint  Patient presents with   Follow-up    Productive cough with clear phlegm  Dyspnea:  Can do housecleaning x 30 min while on 2lpm  Cough: mostly in am's /clear mucus  Sleeping: ok flat bed with a lot of pillows s resp cc SABA use: too much neb / did not bring with other meds  02: 2lpm 24/7  Covid status: never vaccinated  Rec Plan A = Automatic = Always=    Breztri Take 2 puffs first thing in am and then another 2 puffs about 12 hours later.  Work on inhaler technique:  Plan B = Backup (to supplement plan A, not to replace it) Only use your albuterol inhaler(PROAIR)  as a rescue medication Plan C = Crisis (instead of Plan B but only if Plan B stops working) - only use your albuterol nebulizer if you first try Plan B and it fails to help Please schedule a follow up visit in 3 months but call sooner if needed  with all  inhalers/ solutions in hand so we can verify exactly what you are taking. This includes  all medications from all doctors and over the counters   03/15/2021  f/u ov/[facility] office/Candace Harris re: GOLD I/  uacs/ maint on breztri   Chief Complaint  Patient presents with   Follow-up    Patient uses 2.5 L O2 all of time. She says that when she is sitting she doesn't feel that she needs it but when she is up moving or doing anything she needs the 2.5 L O2. She does sleep with the oxygen on because she wakes up in the morning feeling SOB when shes not using it overnight. She would like to be weaned off the oxygen. Uses nebulizer once in  the morning and once at night.   Dyspnea:  only goes out to doctor's office/ room to room at home  Cough: sensation of globus / almost threw up despite max gerd rx  Sleeping: flat bed /on side/ 2 pillows no resp symptoms  SABA use: only uses p ex but neb twice weeks esp in am  02: using 02 p ex and hs  Covid status: no vax/ no infection      No obvious day to day or daytime variability or assoc excess/ purulent sputum or mucus plugs or hemoptysis or cp or chest tightness, subjective wheeze or overt sinus or hb symptoms.   Sleeping  without nocturnal  or early am exacerbation  of respiratory  c/o's or need for noct saba. Also denies any obvious fluctuation of symptoms with weather or environmental changes or other aggravating or alleviating factors except as outlined above   No unusual exposure hx or h/o childhood pna/ asthma or knowledge of premature birth.  Current Allergies, Complete Past Medical History, Past Surgical History, Family History, and Social History were reviewed in [employer] record.  ROS  The following are not active complaints unless bolded Hoarseness, sore throat, dysphagia, dental problems, itching, sneezing,  nasal congestion or discharge of excess mucus or purulent secretions, ear ache,   fever, chills, sweats, unintended wt loss or wt gain, classically pleuritic or exertional cp,  orthopnea pnd or arm/hand  swelling  or leg swelling, presyncope, palpitations, abdominal pain, anorexia, nausea, vomiting, diarrhea  or change in bowel habits or change in bladder habits, change in stools or change in urine, dysuria, hematuria,  rash, arthralgias, visual complaints, headache, numbness, weakness or ataxia or problems with walking or coordination,  change in mood or  memory.        Current Meds  Medication Sig   albuterol (VENTOLIN HFA) 108 (90 Base) MCG/ACT inhaler Inhale 2 puffs into the lungs every 4 (four) hours as needed for wheezing or shortness of breath. for wheezing   amLODipine (NORVASC) 2.5 MG tablet TAKE 1 TABLET EVERY DAY   Budeson-Glycopyrrol-Formoterol (BREZTRI AEROSPHERE) 160-9-4.8 MCG/ACT AERO Inhale 2 puffs into the lungs 2 (two) times daily.   busPIRone (BUSPAR) 5 MG tablet Take 5 mg by mouth 2 (two) times daily.   cyclobenzaprine (FLEXERIL) 10 MG tablet Take 1 tablet by mouth 2 (two) times daily.   dicyclomine (BENTYL) 20 MG tablet Take 20 mg by mouth 4 (four) times daily.   famotidine (PEPCID) 20 MG tablet One after supper   Ibuprofen (ADVIL PO) Take 500 mg by mouth daily as needed.   metFORMIN (GLUCOPHAGE) 500 MG tablet TAKE 1 TABLET TWICE DAILY WITH MEALS   nitroGLYCERIN (NITROSTAT) 0.4 MG SL tablet Place 0.4 mg under the tongue.   omeprazole (PRILOSEC) 40 MG capsule Take 30-60 min before first meal of the day   ondansetron (ZOFRAN) 4 MG tablet Take 4 mg by mouth daily.   rosuvastatin (CRESTOR) 20 MG tablet Take 20 mg by mouth at bedtime.   sertraline (ZOLOFT) 100 MG tablet Take 100 mg by mouth daily.                    Past Medical History:  Diagnosis Date   Anxiety    Bipolar affective disorder (HCC)    CAD (coronary artery disease)    Chronic respiratory failure (HCC)    COPD (chronic obstructive pulmonary disease) (HCC)    DM (diabetes mellitus) (HCC)    GERD (gastroesophageal reflux  disease)    HTN (hypertension)    Polymyalgia rheumatica (HCC)            Objective:    03/15/2021         107  12/14/20 107 lb 12.8 oz (48.9 kg)  11/02/20 112 lb 12.8 oz (51.2 kg)  08/15/20 132 lb 4.4 oz (60 kg)    Vital signs reviewed  03/15/2021  - Note at rest 02 sats  99% on 2.5 POC  General appearance:    pleasant amb with 2 wheeled walker     HEENT : pt wearing mask not removed for exam due to covid -19 concerns.    NECK :  without JVD/Nodes/TM/ nl carotid upstrokes bilaterally   LUNGS: no acc muscle use,  Mod barrel  contour chest wall with bilateral  Distant bs s audible wheeze and  without cough on insp or exp maneuvers and mod  Hyperresonant  to  percussion bilaterally     CV:  RRR  no s3 or murmur or increase in P2, and no edema   ABD:  soft and nontender with pos mid insp Hoover's  in the supine position. No bruits or organomegaly appreciated, bowel sounds nl  MS:   ext warm without deformities, calf tenderness, cyanosis or moderate clubbing      SKIN: warm and dry without lesions    NEURO:  alert, approp, nl sensorium with  no motor or cerebellar deficits apparent.           CXR PA and Lateral:   03/15/2021 :    I personally reviewed images and agree with radiology impression as follows:   Pending            Assessment

## 2021-03-21 LAB — CBC WITH DIFFERENTIAL/PLATELET
Basophils Absolute: 0 10*3/uL (ref 0.0–0.2)
Basos: 0 %
EOS (ABSOLUTE): 0.1 10*3/uL (ref 0.0–0.4)
Eos: 0 %
Hematocrit: 37.4 % (ref 34.0–46.6)
Hemoglobin: 11.7 g/dL (ref 11.1–15.9)
Immature Grans (Abs): 0 10*3/uL (ref 0.0–0.1)
Immature Granulocytes: 0 %
Lymphocytes Absolute: 4 10*3/uL — ABNORMAL HIGH (ref 0.7–3.1)
Lymphs: 20 %
MCH: 25.5 pg — ABNORMAL LOW (ref 26.6–33.0)
MCHC: 31.3 g/dL — ABNORMAL LOW (ref 31.5–35.7)
MCV: 82 fL (ref 79–97)
Monocytes Absolute: 1.7 10*3/uL — ABNORMAL HIGH (ref 0.1–0.9)
Monocytes: 8 %
Neutrophils Absolute: 14 10*3/uL — ABNORMAL HIGH (ref 1.4–7.0)
Neutrophils: 72 %
Platelets: 366 10*3/uL (ref 150–450)
RBC: 4.58 x10E6/uL (ref 3.77–5.28)
RDW: 19.2 % — ABNORMAL HIGH (ref 11.7–15.4)
WBC: 19.8 10*3/uL — ABNORMAL HIGH (ref 3.4–10.8)

## 2021-03-21 LAB — BRAIN NATRIURETIC PEPTIDE: BNP: 11.9 pg/mL (ref 0.0–100.0)

## 2021-03-21 LAB — IGE: IgE (Immunoglobulin E), Serum: 72 IU/mL (ref 6–495)

## 2021-03-21 LAB — TSH: TSH: 1.06 u[IU]/mL (ref 0.450–4.500)

## 2021-03-23 ENCOUNTER — Encounter: Payer: Self-pay | Admitting: *Deleted

## 2021-07-11 ENCOUNTER — Other Ambulatory Visit (HOSPITAL_COMMUNITY): Payer: Self-pay | Admitting: Family Medicine

## 2021-09-24 ENCOUNTER — Encounter: Payer: Self-pay | Admitting: Internal Medicine

## 2021-09-24 ENCOUNTER — Other Ambulatory Visit: Payer: Self-pay

## 2021-09-24 ENCOUNTER — Ambulatory Visit: Payer: Medicare PPO | Admitting: Internal Medicine

## 2021-09-24 DIAGNOSIS — J449 Chronic obstructive pulmonary disease, unspecified: Secondary | ICD-10-CM

## 2021-09-24 DIAGNOSIS — F17210 Nicotine dependence, cigarettes, uncomplicated: Secondary | ICD-10-CM | POA: Diagnosis not present

## 2021-09-24 DIAGNOSIS — J9611 Chronic respiratory failure with hypoxia: Secondary | ICD-10-CM | POA: Diagnosis not present

## 2021-09-24 NOTE — Assessment & Plan Note (Signed)
Active smoker ?-  PFT's  11/02/2020  FEV1 1.42 (85 % ) ratio 0.67  p 0 % improvement from saba p ? prior to study with DLCO  5.50 (31%) corrects to 1.55 (36%)  for alv volume and FV curve mild concavity   ?- 11/02/2020  try breztri 2bid and max gerd rx   ?- Allergy profile 03/15/21  >  Eos 0.1 /  IgE  72 ?- 09/24/2021  After extensive coaching inhaler device,  effectiveness =  75%    ? ? Group D in terms of symptom/risk and laba/lama/ICS  therefore appropriate rx at this point >>>  Continue breztri and prn saba ? ?Re SABA :  I spent extra time with pt today reviewing appropriate use of albuterol for prn use on exertion with the following points: ?1) saba is for relief of sob that does not improve by walking a slower pace or resting but rather if the pt does not improve after trying this first. ?2) If the pt is convinced, as many are, that saba helps recover from activity faster then it's easy to tell if this is the case by re-challenging : ie stop, take the inhaler, then p 5 minutes try the exact same activity (intensity of workload) that just caused the symptoms and see if they are substantially diminished or not after saba ?3) if there is an activity that reproducibly causes the symptoms, try the saba 15 min before the activity on alternate days  ? ?If in fact the saba really does help, then fine to continue to use it prn but advised may need to look closer at the maintenance regimen being used to achieve better control of airways disease with exertion.  ? ?  ?

## 2021-09-24 NOTE — Assessment & Plan Note (Signed)
Counseled re importance of smoking cessation but did not meet time criteria for separate billing   ? ?Low-dose CT lung cancer screening is recommended for patients who ?are 19-70 years of age with a 20+ pack-year history of smoking and ?who are currently smoking or quit <=15 years ago. ?No coughing up blood  ?No unintentional weight loss of > 15 pounds in the last 6 months  ?>>> referred for shared decision making ? ? ?    ?  ? ?Each maintenance medication was reviewed in detail including emphasizing most importantly the difference between maintenance and prns and under what circumstances the prns are to be triggered using an action plan format where appropriate. ? ?Total time for H and P, chart review, counseling, reviewing hfa/neb/02 device(s) and generating customized AVS unique to this office visit / same day charting = 30 min  ?     ?

## 2021-09-24 NOTE — Assessment & Plan Note (Signed)
02 x around 2019 by Dr Juanetta Gosling ?-  11/02/2020   Walked RA  approx   450 ft  @ mod pace  stopped due to  [employer] /fatigue with sats still 94% ?- 03/15/2021   Walked on RA x  3  lap(s) =  approx 450  @ slow to mod pace, stopped due to end of study  with lowest 02 sats 94%   ? ?rec as of 09/24/2021  = 2lpm hs and prn daytime with goal of keeping sats > 90%  ? ?Again advised: ? Make sure you check your oxygen saturation  AT  your highest level of activity (not after you stop)   to be sure it stays over 90% and adjust  02 flow upward to maintain this level if needed but remember to turn it back to previous settings when you stop (to conserve your supply).  ? ? ? ?  ?

## 2021-09-24 NOTE — Patient Instructions (Addendum)
The key is to stop smoking completely before smoking completely stops you! ? ? Work on inhaler technique:  relax and gently blow all the way out then take a nice smooth full deep breath back in, triggering the inhaler at same time you start breathing in.  Hold for up to 5 seconds if you can. Blow Breztri  out thru nose. Rinse and gargle with water when done.  If mouth or throat bother you at all,  try brushing teeth/gums/tongue with arm and hammer toothpaste/ make a slurry and gargle and spit out.  ? ?- remember how golfers practice swinging to use empty device first to  "warm up"  ? ?Make sure you check your oxygen saturation  AT  your highest level of activity (not after you stop)   to be sure it stays over 90% and adjust  02 flow upward to maintain this level if needed but remember to turn it back to previous settings when you stop (to conserve your supply).  ? ?My nurse practitioner will call you to set up screening CT chest  ? ?Please schedule a follow up visit in 6  months but call sooner if needed  ? ?   ?   ? ? ?

## 2021-09-24 NOTE — Progress Notes (Signed)
Candace Harris, female    DOB: 1951/03/06    MRN: [id number]   Brief patient profile:  4 yobf active smoker with GOLD I COPD by pfts 02/2019 transferred care to [facility] office 11/02/2020 p admit (see below)   From Dr Chestine Spore 11/24/19 71 year old woman with a history of tobacco abuse and COPD who presents for evaluation of dyspnea on exertion.   She was diagnosed with COPD about 2 years ago but has had progressively worsening shortness of breath over the last 2 years.  She also has cough, sputum production, wheezing, but dyspnea on exertion is her main complaint.  Her activity is fairly limited; she is only able to walk a few steps around her house without stopping.   She is on 3 L supplemental oxygen at home.  She was prescribed 2.5 L but had ongoing dyspnea on titrated up to 3 L.  Her home saturations are usually in the 90s, but sometimes drop into the 80s.  She is currently using her Trelegy inhaler daily with frequent albuterol.  She continues to smoke 0.25- 0.5 packs/day; she has smoked 0.5 ppd for the last 50 years.     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  Rec: trelegy Check echo: 12/15/19  G I diastolic dysfunction / no cor pulmonale    Stopped trelegy  mid Jan 2022 due irritated mouth   Admit date: 08/15/2020 Discharge date: 08/18/2020 Brief Hospitalization Summary: Please see all hospital notes, images, labs for full details of the hospitalization. ADMISSION HPI: Candace Harris is a 71 y.o. female with medical history significant for HTN, CAD, anxiety, COPD (on 3-6 L of O2 at home), T2DM, GERD and bipolar disorder who presents to the emergency department due to 9-month onset of shortness of breath that has been progressively worsening, she complained of decreased oral intake due to loss of taste, she also endorsed loss of smell and she states that she had diarrhea for 2 to 3 weeks last month which has resolved.  She states that she saw her PCP in [place] who prescribed  prednisone for her, but she has not yet filled the prescription.  She decided to go to the ED for further evaluation due to persistent worsening shortness of breath and concern for dehydration.   ED Course: In the emergency department, she was tachycardic and intermittently tachypneic. Work-up in the ED showed leukocytosis, thrombocytosis, hypokalemia, hyponatremia, BUN/creatinine 41/1.70 (baseline creatinine was 0.7). D-dimer 0.78. Chest x-ray was reflective of emphysema but showed no active cardiopulmonary disease Breathing treatment was provided, Solu-Medrol 125 Mg x1 was given, IV Ativan Librium x1 due to anxiety was given and patient was provided with 500 mL of IV NS. Hospitalist was asked to admit patient for further evaluation and management.   Hospital Course    Acute on chronic respiratory failure with hypoxia - secondary to COPD exacerbation.  Pt much improved after IV steroids, bronchodilators and supportive measures. .  Prednisone taper.   Hypomagnesemia - IV replacement given and repleted.  Hypokalemia - repleted.  Essential hypertension - continue home meds GERD - protonix for GI protection.  Type 2 DM - monitored and stable.   AKI - resolved after hydration.    Discharge Diagnoses:  Principal Problem:   Shortness of breath Active Problems:   AKI (acute kidney injury) (HCC)   Dehydration   Leukocytosis   Thrombocytosis   Essential hypertension   Elevated d-dimer   Hypokalemia   Anxiety   COPD  with acute exacerbation (HCC)   Hyperlipidemia   Diabetes mellitus (HCC)   GERD (gastroesophageal reflux disease)   CAD (coronary artery disease)        History of Present Illness  11/02/2020  Pulmonary/ 1st office eval/ Raju Coppolino / [facility] GOLD I copd with component of UACS/ anxiety  Chief Complaint  Patient presents with   Follow-up    Productive cough with white phlegm since January 2022  Dyspnea:  Can do 17 steps at appt complex but has to stop at top to recover  / last shopping x 2 y prior to OV   Cough: hocking min white daytime off gerd rx  Sleep: on side flat bed s resp symptoms "as long as I have on my oxygen"  SABA use: avg 6-8 x per day / no maint rx  02 2lpm 24/7 rec Plan A = Automatic = Always=    Breztri Take 2 puffs first thing in am and then another 2 puffs about 12 hours later.  Work on inhaler technique: Plan B = Backup (to supplement plan A, not to replace it) Only use your albuterol inhaler as a rescue medication Try prilosec 40mg   Take 30-60 min before first meal of the day and Pepcid ac (famotidine) 20 mg one after supper  Until return  GERD diet  Please schedule a follow up office visit in 6 weeks, call sooner if needed with all medications /inhalers/ solutions in hand so we can verify exactly what you are taking. This includes all medications from all doctors and over the counters        12/14/2020  f/u ov/[place] office/Shayleen Eppinger re: GOLD I copd with component of UACS/ anxiety / active smoker on breztri 2bid  Chief Complaint  Patient presents with   Follow-up    Productive cough with clear phlegm  Dyspnea:  Can do housecleaning x 30 min while on 2lpm  Cough: mostly in am's /clear mucus  Sleeping: ok flat bed with a lot of pillows s resp cc SABA use: too much neb / did not bring with other meds  02: 2lpm 24/7  Covid status: never vaccinated  Rec Plan A = Automatic = Always=    Breztri Take 2 puffs first thing in am and then another 2 puffs about 12 hours later.  Work on inhaler technique:  Plan B = Backup (to supplement plan A, not to replace it) Only use your albuterol inhaler(PROAIR)  as a rescue medication Plan C = Crisis (instead of Plan B but only if Plan B stops working) - only use your albuterol nebulizer if you first try Plan B and it fails to help Please schedule a follow up visit in 3 months but call sooner if needed  with all  inhalers/ solutions in hand so we can verify exactly what you are taking. This includes  all medications from all doctors and over the counters   03/15/2021  f/u ov/[place] office/Kalie Cabral re: GOLD I/  uacs/ maint on breztri   Chief Complaint  Patient presents with   Follow-up    Patient uses 2.5 L O2 all of time. She says that when she is sitting she doesn't feel that she needs it but when she is up moving or doing anything she needs the 2.5 L O2. She does sleep with the oxygen on because she wakes up in the morning feeling SOB when shes not using it overnight. She would like to be weaned off the oxygen. Uses nebulizer once in the  morning and once at night.   Dyspnea:  only goes out to doctor's office/ room to room at home  Cough: sensation of globus / almost threw up despite max gerd rx  Sleeping: flat bed /on side/ 2 pillows no resp symptoms  SABA use: only uses p ex but neb twice weeks esp in am  02: using 02 p ex and hs  Covid status: no vax/ no infection  Rec Plan A = Automatic = Always=    breztri Take 2 puffs first thing in am and then another 2 puffs about 12 hours later.  Work on inhaler technique:   Plan B = Backup (to supplement plan A, not to replace it) Only use your albuterol inhaler as a rescue medication  Plan C = Crisis (instead of Plan B but only if Plan B stops working) - only use your albuterol nebulizer if you first try Plan B  Make sure you check your oxygen saturation  at your highest level of activity  to be sure it stays over 90%          09/24/2021  f/u ov/[place] office/Aaryn Parrilla re: GOLD 1 copd  maint on breztri    Chief Complaint  Patient presents with   Follow-up    Breztri inhaler is not making a difference for patient she feels no changes since last OV.   Dyspnea:  only goes to doctors office/ walks thru office  Cough: smoker's rattle / min mucoid  Sleeping: on side/4 pillows x years  SABA use: not often 02: 2lpm hs / 2lpm pulsed  prn daytime  Covid status: never vax/ never infected  Lung cancer screening: rec today    No obvious day to  day or daytime variability or assoc purulent sputum or mucus plugs or hemoptysis or cp or chest tightness, subjective wheeze or overt sinus or hb symptoms.   Sleeping as above without nocturnal  or early am exacerbation  of respiratory  c/o's or need for noct saba. Also denies any obvious fluctuation of symptoms with weather or environmental changes or other aggravating or alleviating factors except as outlined above   No unusual exposure hx or h/o childhood pna/ asthma or knowledge of premature birth.  Current Allergies, Complete Past Medical History, Past Surgical History, Family History, and Social History were reviewed in [employer] record.  ROS  The following are not active complaints unless bolded Hoarseness, sore throat, dysphagia, dental problems, itching, sneezing,  nasal congestion or discharge of excess mucus or purulent secretions, ear ache,   fever, chills, sweats, unintended wt loss or wt gain, classically pleuritic or exertional cp,  orthopnea pnd or arm/hand swelling  or leg swelling, presyncope, palpitations, abdominal pain, anorexia, nausea, vomiting, diarrhea  or change in bowel habits or change in bladder habits, change in stools or change in urine, dysuria, hematuria,  rash, arthralgias, visual complaints, headache, numbness, weakness or ataxia or problems with walking or coordination,  change in mood or  memory.        Current Meds  Medication Sig   albuterol (PROVENTIL) (2.5 MG/3ML) 0.083% nebulizer solution Take 3 mLs (2.5 mg total) by nebulization every 4 (four) hours as needed for wheezing or shortness of breath.   albuterol (VENTOLIN HFA) 108 (90 Base) MCG/ACT inhaler Inhale 2 puffs into the lungs every 4 (four) hours as needed for wheezing or shortness of breath. for wheezing   amLODipine (NORVASC) 2.5 MG tablet TAKE 1 TABLET EVERY DAY   ARIPiprazole (ABILIFY) 5  MG tablet Take 5 mg by mouth daily.   Budeson-Glycopyrrol-Formoterol (BREZTRI  AEROSPHERE) 160-9-4.8 MCG/ACT AERO Inhale 2 puffs into the lungs 2 (two) times daily.   busPIRone (BUSPAR) 5 MG tablet Take 5 mg by mouth 2 (two) times daily.   cyclobenzaprine (FLEXERIL) 10 MG tablet Take 1 tablet by mouth 2 (two) times daily.   dicyclomine (BENTYL) 20 MG tablet Take 20 mg by mouth 4 (four) times daily.   famotidine (PEPCID) 20 MG tablet One after supper   Ibuprofen (ADVIL PO) Take 500 mg by mouth daily as needed.   metFORMIN (GLUCOPHAGE) 500 MG tablet TAKE 1 TABLET TWICE DAILY WITH MEALS   nitroGLYCERIN (NITROSTAT) 0.4 MG SL tablet Place 0.4 mg under the tongue.   omeprazole (PRILOSEC) 40 MG capsule Take 30-60 min before first meal of the day   ondansetron (ZOFRAN) 4 MG tablet Take 4 mg by mouth daily.   rosuvastatin (CRESTOR) 20 MG tablet Take 20 mg by mouth at bedtime.   sertraline (ZOLOFT) 100 MG tablet Take 100 mg by mouth daily.                       Past Medical History:  Diagnosis Date   Anxiety    Bipolar affective disorder (HCC)    CAD (coronary artery disease)    Chronic respiratory failure (HCC)    COPD (chronic obstructive pulmonary disease) (HCC)    DM (diabetes mellitus) (HCC)    GERD (gastroesophageal reflux disease)    HTN (hypertension)    Polymyalgia rheumatica (HCC)           Objective:    Wt 09/24/2021        107 03/15/2021         107  12/14/20 107 lb 12.8 oz (48.9 kg)  11/02/20 112 lb 12.8 oz (51.2 kg)  08/15/20 132 lb 4.4 oz (60 kg)     Vital signs reviewed  09/24/2021  - Note at rest 02 sats  97% on 2.5 lpm pulsed    General appearance:    chronically ill thin bm / smoker's rattle   HEENT : pt wearing mask not removed for exam due to covid -19 concerns.    NECK :  without JVD/Nodes/TM/ nl carotid upstrokes bilaterally   LUNGS: no acc muscle use,  Mod barrel  contour chest wall with bilateral  Distant bs s audible wheeze and  without cough on insp or exp maneuvers and mod  Hyperresonant  to  percussion bilaterally      CV:  RRR  no s3 or murmur or increase in P2, and no edema   ABD:  soft and nontender with pos mid insp Hoover's  in the supine position. No bruits or organomegaly appreciated, bowel sounds nl  MS:     ext warm without deformities, calf tenderness, cyanosis   or   obvious joint restrictions   - Mod clubbing   SKIN: warm and dry without lesions    NEURO:  alert, approp, nl sensorium with  no motor or cerebellar deficits apparent.             Assessment

## 2021-11-08 ENCOUNTER — Other Ambulatory Visit: Payer: Self-pay

## 2021-11-08 DIAGNOSIS — Z87891 Personal history of nicotine dependence: Secondary | ICD-10-CM

## 2021-11-08 DIAGNOSIS — Z122 Encounter for screening for malignant neoplasm of respiratory organs: Secondary | ICD-10-CM

## 2021-11-08 DIAGNOSIS — F17210 Nicotine dependence, cigarettes, uncomplicated: Secondary | ICD-10-CM

## 2021-11-09 ENCOUNTER — Other Ambulatory Visit: Payer: Self-pay | Admitting: Internal Medicine

## 2021-12-19 ENCOUNTER — Ambulatory Visit (INDEPENDENT_AMBULATORY_CARE_PROVIDER_SITE_OTHER): Payer: Medicare HMO | Admitting: Acute Care

## 2021-12-19 ENCOUNTER — Encounter: Payer: Self-pay | Admitting: Acute Care

## 2021-12-19 DIAGNOSIS — F17210 Nicotine dependence, cigarettes, uncomplicated: Secondary | ICD-10-CM

## 2021-12-19 NOTE — Progress Notes (Signed)
Virtual Visit via Telephone Note  I connected with Candace Harris on 12/18/21 at  3:30 PM EDT by telephone and verified that I am speaking with the correct person using two identifiers.  Location: Patient:  At home Provider:  [address], [place], [place], [address]    I discussed the limitations, risks, security and privacy concerns of performing an evaluation and management service by telephone and the availability of in person appointments. I also discussed with the patient that there may be a patient responsible charge related to this service. The patient expressed understanding and agreed to proceed.   Shared Decision Making Visit Lung Cancer Screening Program [phone])   Eligibility: Age 71 y.o. Pack Years Smoking History Calculation 57 pack year smoking history (# packs/per year x # years smoked) Recent History of coughing up blood  no Unexplained weight loss? no ( >Than 15 pounds within the last 6 months ) Prior History Lung / other cancer no (Diagnosis within the last 5 years already requiring surveillance chest CT Scans). Smoking Status Current Smoker Former Smokers: Years since quit:  NA  Quit Date:  NA  Visit Components: Discussion included one or more decision making aids. yes Discussion included risk/benefits of screening. yes Discussion included potential follow up diagnostic testing for abnormal scans. yes Discussion included meaning and risk of over diagnosis. yes Discussion included meaning and risk of False Positives. yes Discussion included meaning of total radiation exposure. yes  Counseling Included: Importance of adherence to annual lung cancer LDCT screening. yes Impact of comorbidities on ability to participate in the program. yes Ability and willingness to under diagnostic treatment. yes  Smoking Cessation Counseling: Current Smokers:  Discussed importance of smoking cessation. yes Information about tobacco cessation classes and  interventions provided to patient. yes Patient provided with "ticket" for LDCT Scan. yes Symptomatic Patient. no  Counseling NA Diagnosis Code: Tobacco Use Z72.0 Asymptomatic Patient yes  Counseling (Intermediate counseling: > three minutes counseling) [id number] Former Smokers:  Discussed the importance of maintaining cigarette abstinence. yes Diagnosis Code: Personal History of Nicotine Dependence. U23.536 Information about tobacco cessation classes and interventions provided to patient. Yes Patient provided with "ticket" for LDCT Scan. yes Written Order for Lung Cancer Screening with LDCT placed in Epic. Yes (CT Chest Lung Cancer Screening Low Dose W/O CM) [id number] Z12.2-Screening of respiratory organs Z87.891-Personal history of nicotine dependence  I have spent 25 minutes of face to face/ virtual visit   time with  Candace Harris discussing the risks and benefits of lung cancer screening. We viewed / discussed a power point together that explained in detail the above noted topics. We paused at intervals to allow for questions to be asked and answered to ensure understanding.We discussed that the single most powerful action that he can take to decrease his risk of developing lung cancer is to quit smoking. We discussed whether or not he is ready to commit to setting a quit date. We discussed options for tools to aid in quitting smoking including nicotine replacement therapy, non-nicotine medications, support groups, Quit Smart classes, and behavior modification. We discussed that often times setting smaller, more achievable goals, such as eliminating 1 cigarette a day for a week and then 2 cigarettes a day for a week can be helpful in slowly decreasing the number of cigarettes smoked. This allows for a sense of accomplishment as well as providing a clinical benefit. I provided  him  with smoking cessation  information  with contact information for community resources,  classes, free nicotine replacement  therapy, and access to mobile apps, text messaging, and on-line smoking cessation help. I have also provided  him  the office contact information in the event he needs to contact me, or the screening staff. We discussed the time and location of the scan, and that either Doroteo Glassman RN, Joella Prince, RN  or I will call / send a letter with the results within 24-72 hours of receiving them. The patient verbalized understanding of all of  the above and had no further questions upon leaving the office. They have my contact information in the event they have any further questions.  I spent 3 minutes counseling on smoking cessation and the health risks of continued tobacco abuse.  I explained to the patient that there has been a high incidence of coronary artery disease noted on these exams. I explained that this is a non-gated exam therefore degree or severity cannot be determined. This patient is on statin therapy. I have asked the patient to follow-up with their PCP regarding any incidental finding of coronary artery disease and management with diet or medication as their PCP  feels is clinically indicated. The patient verbalized understanding of the above and had no further questions upon completion of the visit.      Magdalen Spatz, NP             12/19/2021

## 2021-12-19 NOTE — Patient Instructions (Signed)
Thank you for participating in the [facility]. It was our pleasure to meet you today. We will call you with the results of your scan within the next few days. Your scan will be assigned a Lung RADS category score by the physicians reading the scans.  This Lung RADS score determines follow up scanning.  See below for description of categories, and follow up screening recommendations. We will be in touch to schedule your follow up screening annually or based on recommendations of our providers. We will fax a copy of your scan results to your Primary Care Physician, or the physician who referred you to the program, to ensure they have the results. Please call the office if you have any questions or concerns regarding your scanning experience or results.  Our office number is [phone]. Please speak with Candace Phelps, RN. , or  Candace Buckner RN, They are  our Lung Cancer Screening RN.'s If They are unavailable when you call, Please leave a message on the voice mail. We will return your call at our earliest convenience.This voice mail is monitored several times a day.  Remember, if your scan is normal, we will scan you annually as long as you continue to meet the criteria for the program. (Age 55-77, Current smoker or smoker who has quit within the last 15 years). If you are a smoker, remember, quitting is the single most powerful action that you can take to decrease your risk of lung cancer and other pulmonary, breathing related problems. We know quitting is hard, and we are here to help.  Please let us know if there is anything we can do to help you meet your goal of quitting. If you are a former smoker, congratulations. We are proud of you! Remain smoke free! Remember you can refer friends or family members through the number above.  We will screen them to make sure they meet criteria for the program. Thank you for helping us take better care of you by  participating in Lung Screening.  You can receive free nicotine replacement therapy ( patches, gum or mints) by calling 1-800-QUIT NOW. Please call so we can get you on the path to becoming  a non-smoker. I know it is hard, but you can do this!  Lung RADS Categories:  Lung RADS 1: no nodules or definitely non-concerning nodules.  Recommendation is for a repeat annual scan in 12 months.  Lung RADS 2:  nodules that are non-concerning in appearance and behavior with a very low likelihood of becoming an active cancer. Recommendation is for a repeat annual scan in 12 months.  Lung RADS 3: nodules that are probably non-concerning , includes nodules with a low likelihood of becoming an active cancer.  Recommendation is for a 6-month repeat screening scan. Often noted after an upper respiratory illness. We will be in touch to make sure you have no questions, and to schedule your 6-month scan.  Lung RADS 4 A: nodules with concerning findings, recommendation is most often for a follow up scan in 3 months or additional testing based on our provider's assessment of the scan. We will be in touch to make sure you have no questions and to schedule the recommended 3 month follow up scan.  Lung RADS 4 B:  indicates findings that are concerning. We will be in touch with you to schedule additional diagnostic testing based on our provider's  assessment of the scan.  Other options for assistance in smoking cessation (   As covered by your insurance benefits)  Hypnosis for smoking cessation  [employer] [phone]  Acupuncture for smoking cessation  [facility] [phone]   

## 2021-12-21 ENCOUNTER — Ambulatory Visit (HOSPITAL_COMMUNITY)
Admission: RE | Admit: 2021-12-21 | Discharge: 2021-12-21 | Disposition: A | Discharge status: Home / Self Care | Payer: Medicare HMO | Source: Ambulatory Visit | Attending: Acute Care | Admitting: Acute Care

## 2021-12-21 DIAGNOSIS — F17210 Nicotine dependence, cigarettes, uncomplicated: Secondary | ICD-10-CM | POA: Diagnosis present

## 2021-12-21 DIAGNOSIS — Z122 Encounter for screening for malignant neoplasm of respiratory organs: Secondary | ICD-10-CM | POA: Diagnosis not present

## 2021-12-21 DIAGNOSIS — Z87891 Personal history of nicotine dependence: Secondary | ICD-10-CM | POA: Insufficient documentation

## 2021-12-27 ENCOUNTER — Telehealth: Payer: Self-pay | Admitting: Acute Care

## 2021-12-27 DIAGNOSIS — F17210 Nicotine dependence, cigarettes, uncomplicated: Secondary | ICD-10-CM

## 2021-12-27 DIAGNOSIS — Z87891 Personal history of nicotine dependence: Secondary | ICD-10-CM

## 2021-12-27 DIAGNOSIS — Z122 Encounter for screening for malignant neoplasm of respiratory organs: Secondary | ICD-10-CM

## 2021-12-27 NOTE — Telephone Encounter (Signed)
Please let patient know her CT was read as a Lung RADS 2: nodules that are benign in appearance and behavior with a very low likelihood of becoming a clinically active cancer due to size or lack of growth. Recommendation per radiology is for a repeat LDCT in 12 months.   Dr. Sherene Sires, there was notation of enlargement of the pulmonary outflow tract/main pulmonary arteries again, suggesting pulmonary arterial hypertension. Pt had an echo in 12/2019 which did not show PAH, but wanted to let you know in case you want to repeat the echo as it has been 2 years.   Denise,please order 12 month follow up, and fax results to PCP. Let her know she does have aortic atherosclerosis and Emphysema.  Thanks so much

## 2021-12-27 NOTE — Telephone Encounter (Signed)
Contacted patient by phone to review results of LDCT.  Scan shows no concern for lung cancer.  Atherosclerosis and emphysema noted.  Patient is on statin medication.  Also pulmonary artery hypertension was noted and patient is on medication for high blood pressure and reminded of importance of blood pressure control. Results faxed to PCP and new order placed for annual scan 2024.  Patient acknowledged understanding and had no further questions.

## 2021-12-31 ENCOUNTER — Telehealth: Payer: Self-pay | Admitting: Internal Medicine

## 2021-12-31 DIAGNOSIS — J449 Chronic obstructive pulmonary disease, unspecified: Secondary | ICD-10-CM

## 2021-12-31 DIAGNOSIS — J9611 Chronic respiratory failure with hypoxia: Secondary | ICD-10-CM

## 2022-01-02 NOTE — Telephone Encounter (Signed)
Attempted to call pt but unable to reach. Left message for her to return call. 

## 2022-01-07 NOTE — Telephone Encounter (Signed)
ATC patient to notify of order being placed. Received busy signal again. Nothing further needed.

## 2022-01-23 ENCOUNTER — Encounter: Payer: Self-pay | Admitting: Internal Medicine

## 2022-02-12 ENCOUNTER — Ambulatory Visit (INDEPENDENT_AMBULATORY_CARE_PROVIDER_SITE_OTHER): Payer: Medicare HMO | Admitting: Gastroenterology

## 2022-02-12 ENCOUNTER — Encounter: Payer: Self-pay | Admitting: Gastroenterology

## 2022-02-12 DIAGNOSIS — R1310 Dysphagia, unspecified: Secondary | ICD-10-CM

## 2022-02-12 NOTE — Patient Instructions (Addendum)
Only take dicyclomine as needed for abdominal cramping. Taking it routinely can cause constipation.   Continue omeprazole as you are doing.  We are arranging an upper endoscopy with dilation in the near future.   Please do not take metformin the day of the procedure.   We will see you in follow-up thereafter!  It was a pleasure to see you today. I want to create trusting relationships with patients to provide genuine, compassionate, and quality care. I value your feedback. If you receive a survey regarding your visit,  I greatly appreciate you taking time to fill this out.   Gelene Mink, PhD, ANP-BC [facility]

## 2022-02-12 NOTE — H&P (View-Only) (Signed)
Gastroenterology Office Note    Referring Provider: Dorene Grebe* Primary Care Physician:  Jason Coop, FNP  Primary GI: Dr. Jena Gauss    Chief Complaint   Chief Complaint  Patient presents with   New Patient (Initial Visit)    Pt here for dysphagia x a year or so     History of Present Illness   Candace Harris is a 71 y.o. female presenting today at the request of Bennie Hind IV, FNP due to dysphagia for the past year. No prior EGD. Last colonoscopy reported by patient to be at [facility] 5 years ago, but there is no record of this.   States she can only eat soft foods. Unable to eat meat. Rice and bread sticks. Notes odynophagia at times. Notes epigastric pain constantly. Stays sore X 50 years. Present since birth of her son. States she used to weigh 120, now 105. Takes 81 mg aspirin each morning. Takes Aleve for joint pain as needed. Tylenol as needed.   GERD: omeprazole 40 mg daily. Pepcid at night. Controls typical GERD symptoms.   States will get constipated and then have diarrhea with vomiting. Symptoms present about 6 months.  Has been taking dicyclomine 20 mg QID for several years, prescribed due to chronic epigastric pain.   COPD: 2 liters nasal cannula continuously.   Past Medical History:  Diagnosis Date   Anxiety    Bipolar affective disorder (HCC)    CAD (coronary artery disease)    Chronic respiratory failure (HCC)    COPD (chronic obstructive pulmonary disease) (HCC)    DM (diabetes mellitus) (HCC)    GERD (gastroesophageal reflux disease)    HTN (hypertension)    Polymyalgia rheumatica (HCC)     Past Surgical History:  Procedure Laterality Date   ANTERIOR FUSION CERVICAL SPINE  2012   C5-7   BACK SURGERY     SHOULDER SURGERY      Current Outpatient Medications  Medication Sig Dispense Refill   albuterol (PROVENTIL) (2.5 MG/3ML) 0.083% nebulizer solution Take 3 mLs (2.5 mg total) by nebulization every 4 (four) hours  as needed for wheezing or shortness of breath.     albuterol (VENTOLIN HFA) 108 (90 Base) MCG/ACT inhaler Inhale 2 puffs into the lungs every 4 (four) hours as needed for wheezing or shortness of breath. for wheezing 8 g 2   amLODipine (NORVASC) 2.5 MG tablet TAKE 1 TABLET EVERY DAY     ARIPiprazole (ABILIFY) 5 MG tablet Take 5 mg by mouth daily.     Budeson-Glycopyrrol-Formoterol (BREZTRI AEROSPHERE) 160-9-4.8 MCG/ACT AERO Inhale 2 puffs into the lungs 2 (two) times daily. 10.7 g 11   busPIRone (BUSPAR) 5 MG tablet Take 5 mg by mouth 2 (two) times daily.     cyclobenzaprine (FLEXERIL) 10 MG tablet Take 1 tablet by mouth 2 (two) times daily.     dicyclomine (BENTYL) 20 MG tablet Take 20 mg by mouth 4 (four) times daily.     famotidine (PEPCID) 20 MG tablet TAKE 1 TABLET DAILY AFTER SUPPER 30 tablet 11   Ibuprofen (ADVIL PO) Take 500 mg by mouth daily as needed.     metFORMIN (GLUCOPHAGE) 500 MG tablet TAKE 1 TABLET TWICE DAILY WITH MEALS     nitroGLYCERIN (NITROSTAT) 0.4 MG SL tablet Place 0.4 mg under the tongue.     omeprazole (PRILOSEC) 40 MG capsule TAKE 1 CAPSULE 30 TO 60 MINUTES BEFORE FIRST MEAL OF THE DAY 30 capsule 11  ondansetron (ZOFRAN) 4 MG tablet Take 4 mg by mouth daily.     rosuvastatin (CRESTOR) 20 MG tablet Take 20 mg by mouth at bedtime.     sertraline (ZOLOFT) 100 MG tablet Take 100 mg by mouth daily.     No current facility-administered medications for this visit.    Allergies as of 02/12/2022 - Review Complete 02/12/2022  Allergen Reaction Noted   Alendronate Other (See Comments) 02/08/2014   Ibuprofen Nausea And Vomiting 11/04/2013   Ramipril Nausea And Vomiting and Other (See Comments) 11/04/2013   Sulfabenzamide Other (See Comments) 11/04/2013    Family History  Problem Relation Age of Onset   Hypertension Mother    Breast cancer Sister    Colon cancer Neg Hx     Social History   Socioeconomic History   Marital status: Single    Spouse name: Not on  file   Number of children: Not on file   Years of education: Not on file   Highest education level: Not on file  Occupational History   Not on file  Tobacco Use   Smoking status: Every Day    Packs/day: 1.00    Years: 50.00    Total pack years: 50.00    Types: Cigarettes   Smokeless tobacco: Never  Vaping Use   Vaping Use: Never used  Substance and Sexual Activity   Alcohol use: Never   Drug use: Never   Sexual activity: Not on file  Other Topics Concern   Not on file  Social History Narrative   Not on file   Social Determinants of Health   Financial Resource Strain: Not on file  Food Insecurity: Not on file  Transportation Needs: Not on file  Physical Activity: Not on file  Stress: Not on file  Social Connections: Not on file  Intimate Partner Violence: Not on file     Review of Systems   Gen: see HPI CV: Denies chest pain, heart palpitations, peripheral edema, syncope.  Resp: +DOE GI: see HPI GU : Denies urinary burning, urinary frequency, urinary hesitancy MS: +joint pain  Derm: Denies rash, itching, dry skin Psych: Denies depression, anxiety, memory loss, and confusion Heme: Denies bruising, bleeding, and enlarged lymph nodes.   Physical Exam   BP 113/73   Pulse (!) 109   Temp (!) 97.5 F (36.4 C)   Ht 5' 1.5" (1.562 m)   Wt 105 lb (47.6 kg)   BMI 19.52 kg/m  General:   Alert and oriented. Pleasant and cooperative. Well-nourished and well-developed.  Head:  Normocephalic and atraumatic. Eyes:  Without icterus Ears:  Normal auditory acuity. Lungs:  no distress, clear, diminished in bases Heart:  S1, S2 present without obvious murmurs Abdomen:  +BS, soft, mild TTP epigastric and non-distended. No HSM noted. No guarding or rebound. No masses appreciated.  Rectal:  Deferred  Msk: uses walker for ambulation.  Extremities:  Without edema. Neurologic:  Alert and  oriented x4 Skin:  Intact without significant lesions or rashes. Psych:  Alert and  cooperative. Normal mood and affect.   Assessment   Candace Harris is a 72 y.o. female presenting today as a new patient in consultation for dysphagia for the past one year. She has had no prior EGD, and she reports a colonoscopy approximately 5 years ago at [facility] but reports are not available.  Dysphagia: to solid foods. Intermittent odynophagia. Chronic omeprazole 40 mg daily and Pepcid in evening, which has controled typical GERD symptoms. Weight loss of approximately  15 lbs noted since onset of symptoms. Intermittent NSAID use for joint pain. Needs EGD with dilation in near future.   Epigastric pain: chronic. Noted for past 50 years since birth of son. No precipitating or relieving factors. I have asked her to decrease scheduled dicyclomine as she notes bout with constipation followed by diarrhea/vomiting. Use dicyclomine only as needed. Will need close follow-up following EGD/dilation for further evaluation.     PLAN   Proceed with upper endoscopy/dilation by Dr. Jena Gauss in near future: the risks, benefits, and alternatives have been discussed with the patient in detail. The patient states understanding and desires to proceed. ASA 3  Hold metformin day of procedure  Continue omeprazole 40 mg daily  Take dicyclomine only as needed  Return in 3 months   Gelene Mink, PhD, [facility]

## 2022-02-12 NOTE — Progress Notes (Signed)
Gastroenterology Office Note    Referring Provider: Dorene Grebe* Primary Care Physician:  Jason Coop, FNP  Primary GI: Dr. Jena Gauss    Chief Complaint   Chief Complaint  Patient presents with   New Patient (Initial Visit)    Pt here for dysphagia x a year or so     History of Present Illness   Candace Harris is a 71 y.o. female presenting today at the request of Bennie Hind IV, FNP due to dysphagia for the past year. No prior EGD. Last colonoscopy reported by patient to be at [facility] 5 years ago, but there is no record of this.   States she can only eat soft foods. Unable to eat meat. Rice and bread sticks. Notes odynophagia at times. Notes epigastric pain constantly. Stays sore X 50 years. Present since birth of her son. States she used to weigh 120, now 105. Takes 81 mg aspirin each morning. Takes Aleve for joint pain as needed. Tylenol as needed.   GERD: omeprazole 40 mg daily. Pepcid at night. Controls typical GERD symptoms.   States will get constipated and then have diarrhea with vomiting. Symptoms present about 6 months.  Has been taking dicyclomine 20 mg QID for several years, prescribed due to chronic epigastric pain.   COPD: 2 liters nasal cannula continuously.   Past Medical History:  Diagnosis Date   Anxiety    Bipolar affective disorder (HCC)    CAD (coronary artery disease)    Chronic respiratory failure (HCC)    COPD (chronic obstructive pulmonary disease) (HCC)    DM (diabetes mellitus) (HCC)    GERD (gastroesophageal reflux disease)    HTN (hypertension)    Polymyalgia rheumatica (HCC)     Past Surgical History:  Procedure Laterality Date   ANTERIOR FUSION CERVICAL SPINE  2012   C5-7   BACK SURGERY     SHOULDER SURGERY      Current Outpatient Medications  Medication Sig Dispense Refill   albuterol (PROVENTIL) (2.5 MG/3ML) 0.083% nebulizer solution Take 3 mLs (2.5 mg total) by nebulization every 4 (four) hours  as needed for wheezing or shortness of breath.     albuterol (VENTOLIN HFA) 108 (90 Base) MCG/ACT inhaler Inhale 2 puffs into the lungs every 4 (four) hours as needed for wheezing or shortness of breath. for wheezing 8 g 2   amLODipine (NORVASC) 2.5 MG tablet TAKE 1 TABLET EVERY DAY     ARIPiprazole (ABILIFY) 5 MG tablet Take 5 mg by mouth daily.     Budeson-Glycopyrrol-Formoterol (BREZTRI AEROSPHERE) 160-9-4.8 MCG/ACT AERO Inhale 2 puffs into the lungs 2 (two) times daily. 10.7 g 11   busPIRone (BUSPAR) 5 MG tablet Take 5 mg by mouth 2 (two) times daily.     cyclobenzaprine (FLEXERIL) 10 MG tablet Take 1 tablet by mouth 2 (two) times daily.     dicyclomine (BENTYL) 20 MG tablet Take 20 mg by mouth 4 (four) times daily.     famotidine (PEPCID) 20 MG tablet TAKE 1 TABLET DAILY AFTER SUPPER 30 tablet 11   Ibuprofen (ADVIL PO) Take 500 mg by mouth daily as needed.     metFORMIN (GLUCOPHAGE) 500 MG tablet TAKE 1 TABLET TWICE DAILY WITH MEALS     nitroGLYCERIN (NITROSTAT) 0.4 MG SL tablet Place 0.4 mg under the tongue.     omeprazole (PRILOSEC) 40 MG capsule TAKE 1 CAPSULE 30 TO 60 MINUTES BEFORE FIRST MEAL OF THE DAY 30 capsule 11  ondansetron (ZOFRAN) 4 MG tablet Take 4 mg by mouth daily.     rosuvastatin (CRESTOR) 20 MG tablet Take 20 mg by mouth at bedtime.     sertraline (ZOLOFT) 100 MG tablet Take 100 mg by mouth daily.     No current facility-administered medications for this visit.    Allergies as of 02/12/2022 - Review Complete 02/12/2022  Allergen Reaction Noted   Alendronate Other (See Comments) 02/08/2014   Ibuprofen Nausea And Vomiting 11/04/2013   Ramipril Nausea And Vomiting and Other (See Comments) 11/04/2013   Sulfabenzamide Other (See Comments) 11/04/2013    Family History  Problem Relation Age of Onset   Hypertension Mother    Breast cancer Sister    Colon cancer Neg Hx     Social History   Socioeconomic History   Marital status: Single    Spouse name: Not on  file   Number of children: Not on file   Years of education: Not on file   Highest education level: Not on file  Occupational History   Not on file  Tobacco Use   Smoking status: Every Day    Packs/day: 1.00    Years: 50.00    Total pack years: 50.00    Types: Cigarettes   Smokeless tobacco: Never  Vaping Use   Vaping Use: Never used  Substance and Sexual Activity   Alcohol use: Never   Drug use: Never   Sexual activity: Not on file  Other Topics Concern   Not on file  Social History Narrative   Not on file   Social Determinants of Health   Financial Resource Strain: Not on file  Food Insecurity: Not on file  Transportation Needs: Not on file  Physical Activity: Not on file  Stress: Not on file  Social Connections: Not on file  Intimate Partner Violence: Not on file     Review of Systems   Gen: see HPI CV: Denies chest pain, heart palpitations, peripheral edema, syncope.  Resp: +DOE GI: see HPI GU : Denies urinary burning, urinary frequency, urinary hesitancy MS: +joint pain  Derm: Denies rash, itching, dry skin Psych: Denies depression, anxiety, memory loss, and confusion Heme: Denies bruising, bleeding, and enlarged lymph nodes.   Physical Exam   BP 113/73   Pulse (!) 109   Temp (!) 97.5 F (36.4 C)   Ht 5' 1.5" (1.562 m)   Wt 105 lb (47.6 kg)   BMI 19.52 kg/m  General:   Alert and oriented. Pleasant and cooperative. Well-nourished and well-developed.  Head:  Normocephalic and atraumatic. Eyes:  Without icterus Ears:  Normal auditory acuity. Lungs:  no distress, clear, diminished in bases Heart:  S1, S2 present without obvious murmurs Abdomen:  +BS, soft, mild TTP epigastric and non-distended. No HSM noted. No guarding or rebound. No masses appreciated.  Rectal:  Deferred  Msk: uses walker for ambulation.  Extremities:  Without edema. Neurologic:  Alert and  oriented x4 Skin:  Intact without significant lesions or rashes. Psych:  Alert and  cooperative. Normal mood and affect.   Assessment   Candace Harris is a 71 y.o. female presenting today as a new patient in consultation for dysphagia for the past one year. She has had no prior EGD, and she reports a colonoscopy approximately 5 years ago at [facility] but reports are not available.  Dysphagia: to solid foods. Intermittent odynophagia. Chronic omeprazole 40 mg daily and Pepcid in evening, which has controled typical GERD symptoms. Weight loss of approximately  15 lbs noted since onset of symptoms. Intermittent NSAID use for joint pain. Needs EGD with dilation in near future.   Epigastric pain: chronic. Noted for past 50 years since birth of son. No precipitating or relieving factors. I have asked her to decrease scheduled dicyclomine as she notes bout with constipation followed by diarrhea/vomiting. Use dicyclomine only as needed. Will need close follow-up following EGD/dilation for further evaluation.     PLAN   Proceed with upper endoscopy/dilation by Dr. Rourk in near future: the risks, benefits, and alternatives have been discussed with the patient in detail. The patient states understanding and desires to proceed. ASA 3  Hold metformin day of procedure  Continue omeprazole 40 mg daily  Take dicyclomine only as needed  Return in 3 months   Murphy Bundick W. Shayan Bramhall, PhD, ANP-BC [facility]    

## 2022-02-14 ENCOUNTER — Telehealth: Payer: Self-pay | Admitting: *Deleted

## 2022-02-14 NOTE — Telephone Encounter (Signed)
Attempted to contact pt to schedule procedure. No answer or voicemail

## 2022-02-15 NOTE — Telephone Encounter (Signed)
Called pt. She is going to check her calendar and call back to schedule her EGD/DIL with Dr. Jena Gauss, ASA 3 (ASAP per Tobi Bastos), no metformin day of proc.

## 2022-02-18 NOTE — Telephone Encounter (Signed)
Pt returned call. She scheduled for 8/9 at 12:30pm.discussed prep instructions in detail over the phone. Aware will call back with pre-op appt.  PA approved via cohere. Authorization #[id number], DOS: 02/20/2022 - 05/21/2022

## 2022-02-18 NOTE — Telephone Encounter (Signed)
Called pt and is aware of pre-op appt details. She voiced understanding  

## 2022-02-18 NOTE — Patient Instructions (Signed)
Candace Harris  02/18/2022     @PREFPERIOPPHARMACY @   Your procedure is scheduled on  02/20/2022.   Report to [facility] at  1030  A.M.   Call this number if you have problems the morning of surgery:  [phone]   Remember:  Follow the diet and prep instructions given to you by the office.      Use your nebulizer and your inhaler before you come and bring your rescue inhaler with you.      DO NOT take any medications for diabetes the morning of your procedure.     Take these medicines the morning of surgery with A SIP OF WATER         norvasc, abilify, buspar, flexeril(if needed), pepcid, norco (if needed), prilosec, zofran (if needed).     Do not wear jewelry, make-up or nail polish.  Do not wear lotions, powders, or perfumes, or deodorant.  Do not shave 48 hours prior to surgery.  Men may shave face and neck.  Do not bring valuables to the hospital.  [facility] is not responsible for any belongings or valuables.  Contacts, dentures or bridgework may not be worn into surgery.  Leave your suitcase in the car.  After surgery it may be brought to your room.  For patients admitted to the hospital, discharge time will be determined by your treatment team.  Patients discharged the day of surgery will not be allowed to drive home and must have someone with them for 24 hours.    Special instructions:   DO NOT smoke tobacco or vape for 24 hours before your procedure.   Please read over the following fact sheets that you were given. Anesthesia Post-op Instructions and Care and Recovery After Surgery      Upper Endoscopy, Adult, Care After After the procedure, it is common to have a sore throat. It is also common to have: Mild stomach pain or discomfort. Bloating. Nausea. Follow these instructions at home: The instructions below may help you care for yourself at home. Your health care provider may give you more instructions. If you have questions, ask your health  care provider. If you were given a sedative during the procedure, it can affect you for several hours. Do not drive or operate machinery until your health care provider says that it is safe. If you will be going home right after the procedure, plan to have a responsible adult: Take you home from the hospital or clinic. You will not be allowed to drive. Care for you for the time you are told. Follow instructions from your health care provider about what you may eat and drink. Return to your normal activities as told by your health care provider. Ask your health care provider what activities are safe for you. Take over-the-counter and prescription medicines only as told by your health care provider. Contact a health care provider if you: Have a sore throat that lasts longer than one day. Have trouble swallowing. Have a fever. Get help right away if you: Vomit blood or your vomit looks like coffee grounds. Have bloody, black, or tarry stools. Have a very bad sore throat or you cannot swallow. Have difficulty breathing or very bad pain in your chest or abdomen. These symptoms may be an emergency. Get help right away. Call 911. Do not wait to see if the symptoms will go away. Do not drive yourself to the hospital. Summary After the procedure, it is  common to have a sore throat, mild stomach discomfort, bloating, and nausea. If you were given a sedative during the procedure, it can affect you for several hours. Do not drive until your health care provider says that it is safe. Follow instructions from your health care provider about what you may eat and drink. Return to your normal activities as told by your health care provider. This information is not intended to replace advice given to you by your health care provider. Make sure you discuss any questions you have with your health care provider. Document Revised: 10/10/2021 Document Reviewed: 10/10/2021 Elsevier Patient Education  2023  [employer] Esophageal Dilatation Esophageal dilatation, also called esophageal dilation, is a procedure to widen or open a blocked or narrowed part of the esophagus. The esophagus is the part of the body that moves food and liquid from the mouth to the stomach. You may need this procedure if: You have a buildup of scar tissue in your esophagus that makes it difficult, painful, or impossible to swallow. This can be caused by gastroesophageal reflux disease (GERD). You have cancer of the esophagus. There is a problem with how food moves through your esophagus. In some cases, you may need this procedure repeated at a later time to dilate the esophagus gradually. Tell a health care provider about: Any allergies you have. All medicines you are taking, including vitamins, herbs, eye drops, creams, and over-the-counter medicines. Any problems you or family members have had with anesthetic medicines. Any blood disorders you have. Any surgeries you have had. Any medical conditions you have. Any antibiotic medicines you are required to take before dental procedures. Whether you are pregnant or may be pregnant. What are the risks? Generally, this is a safe procedure. However, problems may occur, including: Bleeding due to a tear in the lining of the esophagus. A hole, or perforation, in the esophagus. What happens before the procedure? Ask your health care provider about: Changing or stopping your regular medicines. This is especially important if you are taking diabetes medicines or blood thinners. Taking medicines such as aspirin and ibuprofen. These medicines can thin your blood. Do not take these medicines unless your health care provider tells you to take them. Taking over-the-counter medicines, vitamins, herbs, and supplements. Follow instructions from your health care provider about eating or drinking restrictions. Plan to have a responsible adult take you home from the hospital or  clinic. Plan to have a responsible adult care for you for the time you are told after you leave the hospital or clinic. This is important. What happens during the procedure? You may be given a medicine to help you relax (sedative). A numbing medicine may be sprayed into the back of your throat, or you may gargle the medicine. Your health care provider may perform the dilatation using various surgical instruments, such as: Simple dilators. This instrument is carefully placed in the esophagus to stretch it. Guided wire bougies. This involves using an endoscope to insert a wire into the esophagus. A dilator is passed over this wire to enlarge the esophagus. Then the wire is removed. Balloon dilators. An endoscope with a small balloon is inserted into the esophagus. The balloon is inflated to stretch the esophagus and open it up. The procedure may vary among health care providers and hospitals. What can I expect after the procedure? Your blood pressure, heart rate, breathing rate, and blood oxygen level will be monitored until you leave the hospital or clinic. Your throat may feel slightly  sore and numb. This will get better over time. You will not be allowed to eat or drink until your throat is no longer numb. When you are able to drink, urinate, and sit on the edge of the bed without nausea or dizziness, you may be able to return home. Follow these instructions at home: Take over-the-counter and prescription medicines only as told by your health care provider. If you were given a sedative during the procedure, it can affect you for several hours. Do not drive or operate machinery until your health care provider says that it is safe. Plan to have a responsible adult care for you for the time you are told. This is important. Follow instructions from your health care provider about any eating or drinking restrictions. Do not use any products that contain nicotine or tobacco, such as cigarettes,  e-cigarettes, and chewing tobacco. If you need help quitting, ask your health care provider. Keep all follow-up visits. This is important. Contact a health care provider if: You have a fever. You have pain that is not relieved by medicine. Get help right away if: You have chest pain. You have trouble breathing. You have trouble swallowing. You vomit blood. You have black, tarry, or bloody stools. These symptoms may represent a serious problem that is an emergency. Do not wait to see if the symptoms will go away. Get medical help right away. Call your local emergency services (911 in the [place] Do not drive yourself to the hospital. Summary Esophageal dilatation, also called esophageal dilation, is a procedure to widen or open a blocked or narrowed part of the esophagus. Plan to have a responsible adult take you home from the hospital or clinic. For this procedure, a numbing medicine may be sprayed into the back of your throat, or you may gargle the medicine. Do not drive or operate machinery until your health care provider says that it is safe. This information is not intended to replace advice given to you by your health care provider. Make sure you discuss any questions you have with your health care provider. Document Revised: 11/17/2019 Document Reviewed: 11/17/2019 Elsevier Patient Education  2023 [employer] Monitored Anesthesia Care, Care After This sheet gives you information about how to care for yourself after your procedure. Your health care provider may also give you more specific instructions. If you have problems or questions, contact your health care provider. What can I expect after the procedure? After the procedure, it is common to have: Tiredness. Forgetfulness about what happened after the procedure. Impaired judgment for important decisions. Nausea or vomiting. Some difficulty with balance. Follow these instructions at home: For the time period you were told by  your health care provider:     Rest as needed. Do not participate in activities where you could fall or become injured. Do not drive or use machinery. Do not drink alcohol. Do not take sleeping pills or medicines that cause drowsiness. Do not make important decisions or sign legal documents. Do not take care of children on your own. Eating and drinking Follow the diet that is recommended by your health care provider. Drink enough fluid to keep your urine pale yellow. If you vomit: Drink water, juice, or soup when you can drink without vomiting. Make sure you have little or no nausea before eating solid foods. General instructions Have a responsible adult stay with you for the time you are told. It is important to have someone help care for you until you are awake  and alert. Take over-the-counter and prescription medicines only as told by your health care provider. If you have sleep apnea, surgery and certain medicines can increase your risk for breathing problems. Follow instructions from your health care provider about wearing your sleep device: Anytime you are sleeping, including during daytime naps. While taking prescription pain medicines, sleeping medicines, or medicines that make you drowsy. Avoid smoking. Keep all follow-up visits as told by your health care provider. This is important. Contact a health care provider if: You keep feeling nauseous or you keep vomiting. You feel light-headed. You are still sleepy or having trouble with balance after 24 hours. You develop a rash. You have a fever. You have redness or swelling around the IV site. Get help right away if: You have trouble breathing. You have new-onset confusion at home. Summary For several hours after your procedure, you may feel tired. You may also be forgetful and have poor judgment. Have a responsible adult stay with you for the time you are told. It is important to have someone help care for you until you  are awake and alert. Rest as told. Do not drive or operate machinery. Do not drink alcohol or take sleeping pills. Get help right away if you have trouble breathing, or if you suddenly become confused. This information is not intended to replace advice given to you by your health care provider. Make sure you discuss any questions you have with your health care provider. Document Revised: 06/05/2021 Document Reviewed: 06/03/2019 Elsevier Patient Education  2023 [employer].

## 2022-02-19 ENCOUNTER — Encounter (HOSPITAL_COMMUNITY): Payer: Self-pay

## 2022-02-19 ENCOUNTER — Encounter (HOSPITAL_COMMUNITY)
Admission: RE | Admit: 2022-02-19 | Discharge: 2022-02-19 | Disposition: A | Discharge status: Home / Self Care | Payer: Medicare HMO | Source: Ambulatory Visit | Attending: Internal Medicine | Admitting: Internal Medicine

## 2022-02-19 ENCOUNTER — Encounter (HOSPITAL_COMMUNITY): Payer: Self-pay | Admitting: Anesthesiology

## 2022-02-19 DIAGNOSIS — E119 Type 2 diabetes mellitus without complications: Secondary | ICD-10-CM

## 2022-02-19 DIAGNOSIS — I10 Essential (primary) hypertension: Secondary | ICD-10-CM

## 2022-02-19 NOTE — Pre-Procedure Instructions (Signed)
Interoffice message sent to [facility] that patient did not show for pre-op.

## 2022-02-20 DIAGNOSIS — D72829 Elevated white blood cell count, unspecified: Secondary | ICD-10-CM

## 2022-02-20 DIAGNOSIS — I10 Essential (primary) hypertension: Secondary | ICD-10-CM

## 2022-02-20 DIAGNOSIS — R1310 Dysphagia, unspecified: Secondary | ICD-10-CM

## 2022-02-20 DIAGNOSIS — E119 Type 2 diabetes mellitus without complications: Secondary | ICD-10-CM

## 2022-02-20 DIAGNOSIS — D75839 Thrombocytosis, unspecified: Secondary | ICD-10-CM

## 2022-02-20 DIAGNOSIS — E876 Hypokalemia: Secondary | ICD-10-CM

## 2022-02-26 ENCOUNTER — Telehealth: Payer: Self-pay | Admitting: *Deleted

## 2022-02-26 NOTE — Telephone Encounter (Signed)
Received call from pt to reschedule her procedure that was on for 8/9. Pt moved to 8/30 at 1:15pm. Aware will mail instructions/pre-op appt.

## 2022-02-27 ENCOUNTER — Encounter: Payer: Self-pay | Admitting: *Deleted

## 2022-03-07 NOTE — Patient Instructions (Addendum)
Candace Harris  03/07/2022     @PREFPERIOPPHARMACY @   Your procedure is scheduled on  03/13/2022.   Report to 03/15/2022 at  1115  A.M.   Call this number if you have problems the morning of surgery:  [phone]   Remember:  Follow the diet instructions given to you by the office.      Use your nebulizer and your inhaler before you come and bring your rescue inhaler with you.      DO NOT take any diabetic medications the morning of your procedure.     Take these medicines the morning of surgery with A SIP OF WATER             norvasc, abilify, buspar, flexeril(if needed), norco(if needed), prilosec, zofran (if needed).     Do not wear jewelry, make-up or nail polish.  Do not wear lotions, powders, or perfumes, or deodorant.  Do not shave 48 hours prior to surgery.  Men may shave face and neck.  Do not bring valuables to the hospital.  [facility] is not responsible for any belongings or valuables.  Contacts, dentures or bridgework may not be worn into surgery.  Leave your suitcase in the car.  After surgery it may be brought to your room.  For patients admitted to the hospital, discharge time will be determined by your treatment team.  Patients discharged the day of surgery will not be allowed to drive home and must have someone with them for 24 hours.    Special instructions:   DO NOT smoke tobacco or vape for 24 hours before your procedure.  Please read over the following fact sheets that you were given. Anesthesia Post-op Instructions and Care and Recovery After Surgery       Upper Endoscopy, Adult, Care After After the procedure, it is common to have a sore throat. It is also common to have: Mild stomach pain or discomfort. Bloating. Nausea. Follow these instructions at home: The instructions below may help you care for yourself at home. Your health care provider may give you more instructions. If you have questions, ask your health care  provider. If you were given a sedative during the procedure, it can affect you for several hours. Do not drive or operate machinery until your health care provider says that it is safe. If you will be going home right after the procedure, plan to have a responsible adult: Take you home from the hospital or clinic. You will not be allowed to drive. Care for you for the time you are told. Follow instructions from your health care provider about what you may eat and drink. Return to your normal activities as told by your health care provider. Ask your health care provider what activities are safe for you. Take over-the-counter and prescription medicines only as told by your health care provider. Contact a health care provider if you: Have a sore throat that lasts longer than one day. Have trouble swallowing. Have a fever. Get help right away if you: Vomit blood or your vomit looks like coffee grounds. Have bloody, black, or tarry stools. Have a very bad sore throat or you cannot swallow. Have difficulty breathing or very bad pain in your chest or abdomen. These symptoms may be an emergency. Get help right away. Call 911. Do not wait to see if the symptoms will go away. Do not drive yourself to the hospital. Summary After the procedure, it is common  to have a sore throat, mild stomach discomfort, bloating, and nausea. If you were given a sedative during the procedure, it can affect you for several hours. Do not drive until your health care provider says that it is safe. Follow instructions from your health care provider about what you may eat and drink. Return to your normal activities as told by your health care provider. This information is not intended to replace advice given to you by your health care provider. Make sure you discuss any questions you have with your health care provider. Document Revised: 10/10/2021 Document Reviewed: 10/10/2021 Elsevier Patient Education  2023 Elsevier  Inc. Esophageal Dilatation Esophageal dilatation, also called esophageal dilation, is a procedure to widen or open a blocked or narrowed part of the esophagus. The esophagus is the part of the body that moves food and liquid from the mouth to the stomach. You may need this procedure if: You have a buildup of scar tissue in your esophagus that makes it difficult, painful, or impossible to swallow. This can be caused by gastroesophageal reflux disease (GERD). You have cancer of the esophagus. There is a problem with how food moves through your esophagus. In some cases, you may need this procedure repeated at a later time to dilate the esophagus gradually. Tell a health care provider about: Any allergies you have. All medicines you are taking, including vitamins, herbs, eye drops, creams, and over-the-counter medicines. Any problems you or family members have had with anesthetic medicines. Any blood disorders you have. Any surgeries you have had. Any medical conditions you have. Any antibiotic medicines you are required to take before dental procedures. Whether you are pregnant or may be pregnant. What are the risks? Generally, this is a safe procedure. However, problems may occur, including: Bleeding due to a tear in the lining of the esophagus. A hole, or perforation, in the esophagus. What happens before the procedure? Ask your health care provider about: Changing or stopping your regular medicines. This is especially important if you are taking diabetes medicines or blood thinners. Taking medicines such as aspirin and ibuprofen. These medicines can thin your blood. Do not take these medicines unless your health care provider tells you to take them. Taking over-the-counter medicines, vitamins, herbs, and supplements. Follow instructions from your health care provider about eating or drinking restrictions. Plan to have a responsible adult take you home from the hospital or clinic. Plan to  have a responsible adult care for you for the time you are told after you leave the hospital or clinic. This is important. What happens during the procedure? You may be given a medicine to help you relax (sedative). A numbing medicine may be sprayed into the back of your throat, or you may gargle the medicine. Your health care provider may perform the dilatation using various surgical instruments, such as: Simple dilators. This instrument is carefully placed in the esophagus to stretch it. Guided wire bougies. This involves using an endoscope to insert a wire into the esophagus. A dilator is passed over this wire to enlarge the esophagus. Then the wire is removed. Balloon dilators. An endoscope with a small balloon is inserted into the esophagus. The balloon is inflated to stretch the esophagus and open it up. The procedure may vary among health care providers and hospitals. What can I expect after the procedure? Your blood pressure, heart rate, breathing rate, and blood oxygen level will be monitored until you leave the hospital or clinic. Your throat may feel slightly sore  and numb. This will get better over time. You will not be allowed to eat or drink until your throat is no longer numb. When you are able to drink, urinate, and sit on the edge of the bed without nausea or dizziness, you may be able to return home. Follow these instructions at home: Take over-the-counter and prescription medicines only as told by your health care provider. If you were given a sedative during the procedure, it can affect you for several hours. Do not drive or operate machinery until your health care provider says that it is safe. Plan to have a responsible adult care for you for the time you are told. This is important. Follow instructions from your health care provider about any eating or drinking restrictions. Do not use any products that contain nicotine or tobacco, such as cigarettes, e-cigarettes, and  chewing tobacco. If you need help quitting, ask your health care provider. Keep all follow-up visits. This is important. Contact a health care provider if: You have a fever. You have pain that is not relieved by medicine. Get help right away if: You have chest pain. You have trouble breathing. You have trouble swallowing. You vomit blood. You have black, tarry, or bloody stools. These symptoms may represent a serious problem that is an emergency. Do not wait to see if the symptoms will go away. Get medical help right away. Call your local emergency services (911 in the [place] Do not drive yourself to the hospital. Summary Esophageal dilatation, also called esophageal dilation, is a procedure to widen or open a blocked or narrowed part of the esophagus. Plan to have a responsible adult take you home from the hospital or clinic. For this procedure, a numbing medicine may be sprayed into the back of your throat, or you may gargle the medicine. Do not drive or operate machinery until your health care provider says that it is safe. This information is not intended to replace advice given to you by your health care provider. Make sure you discuss any questions you have with your health care provider. Document Revised: 11/17/2019 Document Reviewed: 11/17/2019 Elsevier Patient Education  2023 Elsevier Inc. Monitored Anesthesia Care, Care After This sheet gives you information about how to care for yourself after your procedure. Your health care provider may also give you more specific instructions. If you have problems or questions, contact your health care provider. What can I expect after the procedure? After the procedure, it is common to have: Tiredness. Forgetfulness about what happened after the procedure. Impaired judgment for important decisions. Nausea or vomiting. Some difficulty with balance. Follow these instructions at home: For the time period you were told by your health care  provider:     Rest as needed. Do not participate in activities where you could fall or become injured. Do not drive or use machinery. Do not drink alcohol. Do not take sleeping pills or medicines that cause drowsiness. Do not make important decisions or sign legal documents. Do not take care of children on your own. Eating and drinking Follow the diet that is recommended by your health care provider. Drink enough fluid to keep your urine pale yellow. If you vomit: Drink water, juice, or soup when you can drink without vomiting. Make sure you have little or no nausea before eating solid foods. General instructions Have a responsible adult stay with you for the time you are told. It is important to have someone help care for you until you are awake and  alert. Take over-the-counter and prescription medicines only as told by your health care provider. If you have sleep apnea, surgery and certain medicines can increase your risk for breathing problems. Follow instructions from your health care provider about wearing your sleep device: Anytime you are sleeping, including during daytime naps. While taking prescription pain medicines, sleeping medicines, or medicines that make you drowsy. Avoid smoking. Keep all follow-up visits as told by your health care provider. This is important. Contact a health care provider if: You keep feeling nauseous or you keep vomiting. You feel light-headed. You are still sleepy or having trouble with balance after 24 hours. You develop a rash. You have a fever. You have redness or swelling around the IV site. Get help right away if: You have trouble breathing. You have new-onset confusion at home. Summary For several hours after your procedure, you may feel tired. You may also be forgetful and have poor judgment. Have a responsible adult stay with you for the time you are told. It is important to have someone help care for you until you are awake and  alert. Rest as told. Do not drive or operate machinery. Do not drink alcohol or take sleeping pills. Get help right away if you have trouble breathing, or if you suddenly become confused. This information is not intended to replace advice given to you by your health care provider. Make sure you discuss any questions you have with your health care provider. Document Revised: 06/05/2021 Document Reviewed: 06/03/2019 Elsevier Patient Education  2023 [employer].

## 2022-03-08 ENCOUNTER — Encounter (HOSPITAL_COMMUNITY): Payer: Self-pay

## 2022-03-08 ENCOUNTER — Encounter (HOSPITAL_COMMUNITY)
Admission: RE | Admit: 2022-03-08 | Discharge: 2022-03-08 | Disposition: A | Discharge status: Home / Self Care | Payer: Medicare HMO | Source: Ambulatory Visit | Attending: Internal Medicine | Admitting: Internal Medicine

## 2022-03-08 DIAGNOSIS — I10 Essential (primary) hypertension: Secondary | ICD-10-CM

## 2022-03-08 DIAGNOSIS — D75839 Thrombocytosis, unspecified: Secondary | ICD-10-CM

## 2022-03-08 DIAGNOSIS — D72829 Elevated white blood cell count, unspecified: Secondary | ICD-10-CM

## 2022-03-08 DIAGNOSIS — Z01818 Encounter for other preprocedural examination: Secondary | ICD-10-CM | POA: Insufficient documentation

## 2022-03-08 DIAGNOSIS — E119 Type 2 diabetes mellitus without complications: Secondary | ICD-10-CM

## 2022-03-08 DIAGNOSIS — E876 Hypokalemia: Secondary | ICD-10-CM

## 2022-03-08 HISTORY — DX: Cerebral infarction, unspecified: I63.9

## 2022-03-08 HISTORY — DX: Depression, unspecified: F32.A

## 2022-03-08 HISTORY — DX: Other complications of anesthesia, initial encounter: T88.59XA

## 2022-03-08 HISTORY — DX: Other specified postprocedural states: Z98.890

## 2022-03-08 HISTORY — DX: Unspecified osteoarthritis, unspecified site: M19.90

## 2022-03-08 HISTORY — DX: Other specified postprocedural states: R11.2

## 2022-03-08 LAB — CBC WITH DIFFERENTIAL/PLATELET
Abs Immature Granulocytes: 0.03 10*3/uL (ref 0.00–0.07)
Basophils Absolute: 0 10*3/uL (ref 0.0–0.1)
Basophils Relative: 0 %
Eosinophils Absolute: 0.1 10*3/uL (ref 0.0–0.5)
Eosinophils Relative: 1 %
HCT: 32.1 % — ABNORMAL LOW (ref 36.0–46.0)
Hemoglobin: 9.6 g/dL — ABNORMAL LOW (ref 12.0–15.0)
Immature Granulocytes: 0 %
Lymphocytes Relative: 19 %
Lymphs Abs: 2.1 10*3/uL (ref 0.7–4.0)
MCH: 23.2 pg — ABNORMAL LOW (ref 26.0–34.0)
MCHC: 29.9 g/dL — ABNORMAL LOW (ref 30.0–36.0)
MCV: 77.5 fL — ABNORMAL LOW (ref 80.0–100.0)
Monocytes Absolute: 1.2 10*3/uL — ABNORMAL HIGH (ref 0.1–1.0)
Monocytes Relative: 11 %
Neutro Abs: 7.5 10*3/uL (ref 1.7–7.7)
Neutrophils Relative %: 69 %
Platelets: 313 10*3/uL (ref 150–400)
RBC: 4.14 MIL/uL (ref 3.87–5.11)
RDW: 18.4 % — ABNORMAL HIGH (ref 11.5–15.5)
WBC: 10.8 10*3/uL — ABNORMAL HIGH (ref 4.0–10.5)
nRBC: 0 % (ref 0.0–0.2)

## 2022-03-08 LAB — BASIC METABOLIC PANEL
Anion gap: 11 (ref 5–15)
BUN: 10 mg/dL (ref 8–23)
CO2: 25 mmol/L (ref 22–32)
Calcium: 9.4 mg/dL (ref 8.9–10.3)
Chloride: 103 mmol/L (ref 98–111)
Creatinine, Ser: 0.59 mg/dL (ref 0.44–1.00)
GFR, Estimated: >60 mL/min (ref >60)
Glucose, Bld: 106 mg/dL — ABNORMAL HIGH (ref 70–99)
Potassium: 3.1 mmol/L — ABNORMAL LOW (ref 3.5–5.1)
Sodium: 139 mmol/L (ref 135–145)

## 2022-03-12 ENCOUNTER — Other Ambulatory Visit: Payer: Self-pay

## 2022-03-12 MED ORDER — POTASSIUM CHLORIDE CRYS ER 20 MEQ PO TBCR: 20.0000 meq | EXTENDED_RELEASE_TABLET | Freq: Every day | ORAL | 0 refills | Status: DC

## 2022-03-13 ENCOUNTER — Ambulatory Visit (HOSPITAL_BASED_OUTPATIENT_CLINIC_OR_DEPARTMENT_OTHER): Payer: Medicare HMO | Admitting: Certified Registered Nurse Anesthetist

## 2022-03-13 ENCOUNTER — Ambulatory Visit (HOSPITAL_COMMUNITY): Payer: Medicare HMO | Admitting: Certified Registered Nurse Anesthetist

## 2022-03-13 ENCOUNTER — Encounter (HOSPITAL_COMMUNITY)
Admission: RE | Disposition: A | Discharge status: Home / Self Care | Payer: Self-pay | Source: Home / Self Care | Attending: Internal Medicine

## 2022-03-13 ENCOUNTER — Ambulatory Visit (HOSPITAL_COMMUNITY)
Admission: RE | Admit: 2022-03-13 | Discharge: 2022-03-13 | Disposition: A | Discharge status: Home / Self Care | Payer: Medicare HMO | Attending: Internal Medicine | Admitting: Internal Medicine

## 2022-03-13 ENCOUNTER — Encounter (HOSPITAL_COMMUNITY): Payer: Self-pay | Admitting: Internal Medicine

## 2022-03-13 DIAGNOSIS — F319 Bipolar disorder, unspecified: Secondary | ICD-10-CM | POA: Insufficient documentation

## 2022-03-13 DIAGNOSIS — J449 Chronic obstructive pulmonary disease, unspecified: Secondary | ICD-10-CM | POA: Insufficient documentation

## 2022-03-13 DIAGNOSIS — I2510 Atherosclerotic heart disease of native coronary artery without angina pectoris: Secondary | ICD-10-CM | POA: Insufficient documentation

## 2022-03-13 DIAGNOSIS — R1310 Dysphagia, unspecified: Secondary | ICD-10-CM

## 2022-03-13 DIAGNOSIS — Z7951 Long term (current) use of inhaled steroids: Secondary | ICD-10-CM | POA: Diagnosis not present

## 2022-03-13 DIAGNOSIS — F17210 Nicotine dependence, cigarettes, uncomplicated: Secondary | ICD-10-CM

## 2022-03-13 DIAGNOSIS — E119 Type 2 diabetes mellitus without complications: Secondary | ICD-10-CM | POA: Diagnosis not present

## 2022-03-13 DIAGNOSIS — Z9981 Dependence on supplemental oxygen: Secondary | ICD-10-CM | POA: Diagnosis not present

## 2022-03-13 DIAGNOSIS — I10 Essential (primary) hypertension: Secondary | ICD-10-CM | POA: Diagnosis not present

## 2022-03-13 DIAGNOSIS — F419 Anxiety disorder, unspecified: Secondary | ICD-10-CM | POA: Diagnosis not present

## 2022-03-13 DIAGNOSIS — E876 Hypokalemia: Secondary | ICD-10-CM

## 2022-03-13 DIAGNOSIS — D75839 Thrombocytosis, unspecified: Secondary | ICD-10-CM

## 2022-03-13 DIAGNOSIS — K219 Gastro-esophageal reflux disease without esophagitis: Secondary | ICD-10-CM | POA: Diagnosis not present

## 2022-03-13 DIAGNOSIS — Z7984 Long term (current) use of oral hypoglycemic drugs: Secondary | ICD-10-CM | POA: Insufficient documentation

## 2022-03-13 DIAGNOSIS — D72829 Elevated white blood cell count, unspecified: Secondary | ICD-10-CM

## 2022-03-13 HISTORY — PX: MALONEY DILATION: SHX5535

## 2022-03-13 HISTORY — PX: BIOPSY: SHX5522

## 2022-03-13 HISTORY — PX: ESOPHAGOGASTRODUODENOSCOPY (EGD) WITH PROPOFOL: SHX5813

## 2022-03-13 LAB — GLUCOSE, CAPILLARY: Glucose-Capillary: 114 mg/dL — ABNORMAL HIGH (ref 70–99)

## 2022-03-13 SURGERY — ESOPHAGOGASTRODUODENOSCOPY (EGD) WITH PROPOFOL
Anesthesia: General

## 2022-03-13 MED ORDER — LIDOCAINE 2% (20 MG/ML) 5 ML SYRINGE
INTRAMUSCULAR | Status: DC | PRN
  Administered 2022-03-13: 50 mg via INTRAVENOUS

## 2022-03-13 MED ORDER — PROPOFOL 500 MG/50ML IV EMUL
INTRAVENOUS | Status: DC | PRN
  Administered 2022-03-13: 100 ug/kg/min via INTRAVENOUS

## 2022-03-13 MED ORDER — PROPOFOL 10 MG/ML IV BOLUS
INTRAVENOUS | Status: DC | PRN
  Administered 2022-03-13: 60 mg via INTRAVENOUS

## 2022-03-13 MED ORDER — PROPOFOL 500 MG/50ML IV EMUL
INTRAVENOUS | Status: AC
  Filled 2022-03-13: qty 50

## 2022-03-13 MED ORDER — LACTATED RINGERS IV SOLN: INTRAVENOUS | Status: DC | PRN

## 2022-03-13 NOTE — Discharge Instructions (Addendum)
EGD Discharge instructions Please read the instructions outlined below and refer to this sheet in the next few weeks. These discharge instructions provide you with general information on caring for yourself after you leave the hospital. Your doctor may also give you specific instructions. While your treatment has been planned according to the most current medical practices available, unavoidable complications occasionally occur. If you have any problems or questions after discharge, please call your doctor. ACTIVITY You may resume your regular activity but move at a slower pace for the next 24 hours.  Take frequent rest periods for the next 24 hours.  Walking will help expel (get rid of) the air and reduce the bloated feeling in your abdomen.  No driving for 24 hours (because of the anesthesia (medicine) used during the test).  You may shower.  Do not sign any important legal documents or operate any machinery for 24 hours (because of the anesthesia used during the test).  NUTRITION Drink plenty of fluids.  You may resume your normal diet.  Begin with a light meal and progress to your normal diet.  Avoid alcoholic beverages for 24 hours or as instructed by your caregiver.  MEDICATIONS You may resume your normal medications unless your caregiver tells you otherwise.  WHAT YOU CAN EXPECT TODAY You may experience abdominal discomfort such as a feeling of fullness or "gas" pains.  FOLLOW-UP Your doctor will discuss the results of your test with you.  SEEK IMMEDIATE MEDICAL ATTENTION IF ANY OF THE FOLLOWING OCCUR: Excessive nausea (feeling sick to your stomach) and/or vomiting.  Severe abdominal pain and distention (swelling).  Trouble swallowing.  Temperature over 101 F (37.8 C).  Rectal bleeding or vomiting of blood.       Your esophagus was stretched today.  Samples were taken.    Further recommendations to follow pending review of pathology report   office visit with [place] in 3  months   at patient request, called Letha Cape at [phone] - call rolled to voicemail.  Left a message.

## 2022-03-13 NOTE — Op Note (Signed)
[facility] Patient Name: Candace Harris Procedure Date: 03/13/2022 12:55 PM MRN: [id number] Date of Birth: September 09, 1950 Attending MD: Norvel Richards , MD CSN: [id number] Age: 71 Admit Type: Outpatient Procedure:                Upper GI endoscopy Indications:              Dysphagia Providers:                Norvel Richards, MD, Janeece Riggers, RN, Raphael Gibney, Technician Referring MD:              Medicines:                Propofol per Anesthesia Complications:            No immediate complications. Estimated Blood Loss:     Estimated blood loss was minimal. Procedure:                Pre-Anesthesia Assessment:                           - Prior to the procedure, a History and Physical                            was performed, and patient medications and                            allergies were reviewed. The patient's tolerance of                            previous anesthesia was also reviewed. The risks                            and benefits of the procedure and the sedation                            options and risks were discussed with the patient.                            All questions were answered, and informed consent                            was obtained. Prior Anticoagulants: The patient has                            taken no previous anticoagulant or antiplatelet                            agents. ASA Grade Assessment: III - A patient with                            severe systemic disease. After reviewing the risks  and benefits, the patient was deemed in                            satisfactory condition to undergo the procedure.                           After obtaining informed consent, the endoscope was                            passed under direct vision. Throughout the                            procedure, the patient's blood pressure, pulse, and                            oxygen saturations were  monitored continuously. The                            GIF-H190 ([id number]) scope was introduced through the                            mouth, and advanced to the second part of duodenum.                            The upper GI endoscopy was accomplished without                            difficulty. The patient tolerated the procedure                            well. Scope In: 1:08:19 PM Scope Out: 1:17:25 PM Total Procedure Duration: 0 hours 9 minutes 6 seconds  Findings:      Esophagus was patent throughout its course. Somewhat of a "ringed"       appearance. Mucosa otherwise appeared normal. Easily traversed with the       adult scope.      The entire examined stomach was normal.      The duodenal bulb and second portion of the duodenum were normal. Scope       was withdrawn. A 52 French Maloney dilators passed full insertion with       mild resistance. Finally, a 84 French Maloney dilator was passed to full       insertion with mild resistance. A look back revealed a couple of       superficial tears through the UES and 1 at the GE junction. But no       apparent complication. Finally biopsies of the mid and distal esophagus       were taken for histologic study. Impression:               - Slightly ringed esophagus. Status post dilation                            and biopsy; normal stomach.                           -  Normal duodenal bulb and second portion of the                            duodenum.                           - Moderate Sedation:      Moderate (conscious) sedation was personally administered by an       anesthesia professional. The following parameters were monitored: oxygen       saturation, heart rate, blood pressure, respiratory rate, EKG, adequacy       of pulmonary ventilation, and response to care. Recommendation:           - Patient has a contact number available for                            emergencies. The signs and symptoms of potential                             delayed complications were discussed with the                            patient. Return to normal activities tomorrow.                            Written discharge instructions were provided to the                            patient.                           - Advance diet as tolerated.                           - Continue present medications. Office visit in 3                            months. Follow-up on pathology. Procedure Code(s):        --- Professional ---                           [phone], Esophagogastroduodenoscopy, flexible,                            transoral; diagnostic, including collection of                            specimen(s) by brushing or washing, when performed                            (separate procedure) Diagnosis Code(s):        --- Professional ---                           R13.10, Dysphagia, unspecified CPT copyright 2019 American Medical Association. All rights reserved. The codes documented in this report are preliminary and upon coder review may  be revised  to meet current compliance requirements. Gerrit Friends. Franz Svec, MD Gennette Pac, MD 03/13/2022 1:30:44 PM This report has been signed electronically. Number of Addenda: 0

## 2022-03-13 NOTE — Interval H&P Note (Signed)
History and Physical Interval Note:  03/13/2022 12:58 PM  Dimond Crotty  has presented today for surgery, with the diagnosis of DYSPHAGIA.  The various methods of treatment have been discussed with the patient and family. After consideration of risks, benefits and other options for treatment, the patient has consented to  Procedure(s) with comments: ESOPHAGOGASTRODUODENOSCOPY (EGD) WITH PROPOFOL (N/A) - 12:30pm MALONEY DILATION (N/A) as a surgical intervention.  The patient's history has been reviewed, patient examined, no change in status, stable for surgery.  I have reviewed the patient's chart and labs.  Questions were answered to the patient's satisfaction.     Jacody Beneke  No change.  Per plan, have offered the patient an EGD with esophageal dilation as feasible/appropriate per plan. The risks, benefits, limitations, alternatives and imponderables have been reviewed with the patient. Potential for esophageal dilation, biopsy, etc. have also been reviewed.  Questions have been answered. All parties agreeable.

## 2022-03-13 NOTE — Anesthesia Preprocedure Evaluation (Signed)
Anesthesia Evaluation  Patient identified by MRN, date of birth, ID band Patient awake    Reviewed: Allergy & Precautions, H&P , NPO status , Patient's Chart, lab work & pertinent test results, reviewed documented beta blocker date and time   History of Anesthesia Complications (+) PONV and history of anesthetic complications  Airway Mallampati: II  TM Distance: >3 FB Neck ROM: full    Dental no notable dental hx.    Pulmonary COPD,  oxygen dependent, Current Smoker and Patient abstained from smoking.,    Pulmonary exam normal breath sounds clear to auscultation       Cardiovascular Exercise Tolerance: Good hypertension, + CAD and + DOE   Rhythm:regular Rate:Normal     Neuro/Psych PSYCHIATRIC DISORDERS Anxiety Depression Bipolar Disorder CVA    GI/Hepatic Neg liver ROS, GERD  Medicated,  Endo/Other  negative endocrine ROSdiabetes  Renal/GU negative Renal ROS  negative genitourinary   Musculoskeletal   Abdominal   Peds  Hematology negative hematology ROS (+)   Anesthesia Other Findings   Reproductive/Obstetrics negative OB ROS                             Anesthesia Physical Anesthesia Plan  ASA: 3  Anesthesia Plan: General   Post-op Pain Management:    Induction:   PONV Risk Score and Plan: Propofol infusion  Airway Management Planned:   Additional Equipment:   Intra-op Plan:   Post-operative Plan:   Informed Consent: I have reviewed the patients History and Physical, chart, labs and discussed the procedure including the risks, benefits and alternatives for the proposed anesthesia with the patient or authorized representative who has indicated his/her understanding and acceptance.     Dental Advisory Given  Plan Discussed with: CRNA  Anesthesia Plan Comments:         Anesthesia Quick Evaluation

## 2022-03-13 NOTE — Transfer of Care (Signed)
Immediate Anesthesia Transfer of Care Note  Patient: Candace Harris  Procedure(s) Performed: ESOPHAGOGASTRODUODENOSCOPY (EGD) WITH PROPOFOL MALONEY DILATION BIOPSY  Patient Location: PACU  Anesthesia Type:General  Level of Consciousness: awake, alert  and oriented  Airway & Oxygen Therapy: Patient Spontanous Breathing and Patient connected to nasal cannula oxygen  Post-op Assessment: Report given to RN, Post -op Vital signs reviewed and stable, Patient moving all extremities X 4 and Patient able to stick tongue midline  Post vital signs: Reviewed  Last Vitals:  Vitals Value Taken Time  BP 102/63 03/13/22 1322  Temp 36.8 C 03/13/22 1322  Pulse 90 03/13/22 1322  Resp 19 03/13/22 1322  SpO2 98 % 03/13/22 1322    Last Pain:  Vitals:   03/13/22 1322  TempSrc: Oral  PainSc: 0-No pain      Patients Stated Pain Goal: 8 (03/13/22 1145)  Complications: No notable events documented.

## 2022-03-14 NOTE — Anesthesia Postprocedure Evaluation (Signed)
Anesthesia Post Note  Patient: Candace Harris  Procedure(s) Performed: ESOPHAGOGASTRODUODENOSCOPY (EGD) WITH PROPOFOL MALONEY DILATION BIOPSY  Patient location during evaluation: Phase II Anesthesia Type: General Level of consciousness: awake Pain management: pain level controlled Vital Signs Assessment: post-procedure vital signs reviewed and stable Respiratory status: spontaneous breathing and respiratory function stable Cardiovascular status: blood pressure returned to baseline and stable Postop Assessment: no headache and no apparent nausea or vomiting Anesthetic complications: no Comments: Late entry   No notable events documented.   Last Vitals:  Vitals:   03/13/22 1145 03/13/22 1322  BP: 104/75 102/63  Pulse: 98 90  Resp: 14 19  Temp: 37.4 C 36.8 C  SpO2: 96% 98%    Last Pain:  Vitals:   03/13/22 1322  TempSrc: Oral  PainSc: 0-No pain                 Windell Norfolk

## 2022-03-15 LAB — SURGICAL PATHOLOGY

## 2022-03-20 ENCOUNTER — Encounter (HOSPITAL_COMMUNITY): Payer: Self-pay | Admitting: Internal Medicine

## 2022-03-21 ENCOUNTER — Encounter: Payer: Self-pay | Admitting: Internal Medicine

## 2022-03-25 ENCOUNTER — Encounter: Payer: Self-pay | Admitting: Internal Medicine

## 2022-03-25 ENCOUNTER — Ambulatory Visit (INDEPENDENT_AMBULATORY_CARE_PROVIDER_SITE_OTHER): Payer: Medicare Other | Admitting: Internal Medicine

## 2022-03-25 VITALS — BP 126/80 | HR 106 | Temp 97.7°F | Ht 61.5 in | Wt 103.0 lb

## 2022-03-25 DIAGNOSIS — J9611 Chronic respiratory failure with hypoxia: Secondary | ICD-10-CM

## 2022-03-25 DIAGNOSIS — F17210 Nicotine dependence, cigarettes, uncomplicated: Secondary | ICD-10-CM

## 2022-03-25 DIAGNOSIS — J449 Chronic obstructive pulmonary disease, unspecified: Secondary | ICD-10-CM

## 2022-03-25 MED ORDER — AZITHROMYCIN 250 MG PO TABS: ORAL_TABLET | ORAL | 0 refills | Status: DC

## 2022-03-25 MED ORDER — PREDNISONE 10 MG PO TABS: ORAL_TABLET | ORAL | 0 refills | Status: DC

## 2022-03-25 NOTE — Progress Notes (Unsigned)
Candace Harris, female    DOB: [birth year]    MRN: [id number]   Brief patient profile:  18 yobf active smoker with GOLD I COPD by pfts 02/2019 transferred care to [facility] office 11/02/2020 p admit (see below)   From Dr Chestine Spore 11/24/19 71 year old woman with a history of tobacco abuse and COPD who presents for evaluation of dyspnea on exertion.   She was diagnosed with COPD about 2 years ago but has had progressively worsening shortness of breath over the last 2 years.  She also has cough, sputum production, wheezing, but dyspnea on exertion is her main complaint.  Her activity is fairly limited; she is only able to walk a few steps around her house without stopping.   She is on 3 L supplemental oxygen at home.  She was prescribed 2.5 L but had ongoing dyspnea on titrated up to 3 L.  Her home saturations are usually in the 90s, but sometimes drop into the 80s.  She is currently using her Trelegy inhaler daily with frequent albuterol.  She continues to smoke 0.25- 0.5 packs/day; she has smoked 0.5 ppd for the last 50 years.     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  Rec: trelegy Check echo: 12/15/19  G I diastolic dysfunction / no cor pulmonale    Stopped trelegy  mid Jan 2022 due irritated mouth   Admit date: 08/15/2020 Discharge date: 08/18/2020 Brief Hospitalization Summary: Please see all hospital notes, images, labs for full details of the hospitalization. ADMISSION HPI: Candace Harris is a 71 y.o. female with medical history significant for HTN, CAD, anxiety, COPD (on 3-6 L of O2 at home), T2DM, GERD and bipolar disorder who presents to the emergency department due to 12-month onset of shortness of breath that has been progressively worsening, she complained of decreased oral intake due to loss of taste, she also endorsed loss of smell and she states that she had diarrhea for 2 to 3 weeks last month which has resolved.  She states that she saw her PCP in [place] who prescribed  prednisone for her, but she has not yet filled the prescription.  She decided to go to the ED for further evaluation due to persistent worsening shortness of breath and concern for dehydration.   ED Course: In the emergency department, she was tachycardic and intermittently tachypneic. Work-up in the ED showed leukocytosis, thrombocytosis, hypokalemia, hyponatremia, BUN/creatinine 41/1.70 (baseline creatinine was 0.7). D-dimer 0.78. Chest x-ray was reflective of emphysema but showed no active cardiopulmonary disease Breathing treatment was provided, Solu-Medrol 125 Mg x1 was given, IV Ativan Librium x1 due to anxiety was given and patient was provided with 500 mL of IV NS. Hospitalist was asked to admit patient for further evaluation and management.   Hospital Course    Acute on chronic respiratory failure with hypoxia - secondary to COPD exacerbation.  Pt much improved after IV steroids, bronchodilators and supportive measures. .  Prednisone taper.   Hypomagnesemia - IV replacement given and repleted.  Hypokalemia - repleted.  Essential hypertension - continue home meds GERD - protonix for GI protection.  Type 2 DM - monitored and stable.   AKI - resolved after hydration.    Discharge Diagnoses:  Principal Problem:   Shortness of breath Active Problems:   AKI (acute kidney injury) (HCC)   Dehydration   Leukocytosis   Thrombocytosis   Essential hypertension   Elevated d-dimer   Hypokalemia   Anxiety   COPD  with acute exacerbation (HCC)   Hyperlipidemia   Diabetes mellitus (HCC)   GERD (gastroesophageal reflux disease)   CAD (coronary artery disease)        History of Present Illness  11/02/2020  Pulmonary/ 1st office eval/ [facility] / [facility] GOLD I copd with component of UACS/ anxiety  Chief Complaint  Patient presents with   Follow-up    Productive cough with white phlegm since January 2022  Dyspnea:  Can do 17 steps at appt complex but has to stop at top to recover  / last shopping x 2 y prior to OV   Cough: hocking min white daytime off gerd rx  Sleep: on side flat bed s resp symptoms "as long as I have on my oxygen"  SABA use: avg 6-8 x per day / no maint rx  02 2lpm 24/7 rec Plan A = Automatic = Always=    Breztri Take 2 puffs first thing in am and then another 2 puffs about 12 hours later.  Work on inhaler technique: Plan B = Backup (to supplement plan A, not to replace it) Only use your albuterol inhaler as a rescue medication Try prilosec 40mg   Take 30-60 min before first meal of the day and Pepcid ac (famotidine) 20 mg one after supper  Until return  GERD diet  Please schedule a follow up office visit in 6 weeks, call sooner if needed with all medications /inhalers/ solutions in hand so we can verify exactly what you are taking. This includes all medications from all doctors and over the counters        03/15/2021  f/u ov/[facility]/Rune Mendez re: GOLD I/  uacs/ maint on breztri   Chief Complaint  Patient presents with   Follow-up    Patient uses 2.5 L O2 all of time. She says that when she is sitting she doesn't feel that she needs it but when she is up moving or doing anything she needs the 2.5 L O2. She does sleep with the oxygen on because she wakes up in the morning feeling SOB when shes not using it overnight. She would like to be weaned off the oxygen. Uses nebulizer once in the morning and once at night.   Dyspnea:  only goes out to doctor's office/ room to room at home  Cough: sensation of globus / almost threw up despite max gerd rx  Sleeping: flat bed /on side/ 2 pillows no resp symptoms  SABA use: only uses p ex but neb twice weeks esp in am  02: using 02 p ex and hs  Covid status: no vax/ no infection  Rec Plan A = Automatic = Always=    breztri Take 2 puffs first thing in am and then another 2 puffs about 12 hours later.  Work on inhaler technique:   Plan B = Backup (to supplement plan A, not to replace it) Only use your  albuterol inhaler as a rescue medication  Plan C = Crisis (instead of Plan B but only if Plan B stops working) - only use your albuterol nebulizer if you first try Plan B  Make sure you check your oxygen saturation  at your highest level of activity  to be sure it stays over 90%          03/25/2022  f/u ov/[facility] office/Rameen Quinney re: GOLD 1 with emphysema on CT and ? Cor pulmonale with nl echo 12/15/19   maint on breztri / still smoking  Chief Complaint  Patient presents  with   Follow-up    Is now using 3LO2 during the day prn.  Feels breathing has worsened since last ov.  Wakes up during night feeling SOB   Dyspnea:  mostly housebound  Cough: smoker's rattle / min am mucoid sputum Sleeping: on side 4 pillows  SABA use: way  too much  02: 3lpm hs and prn daytime  Covid status: never vax/ infected     No obvious day to day or daytime variability or assoc excess/ purulent sputum or mucus plugs or hemoptysis or cp or chest tightness, subjective wheeze or overt sinus or hb symptoms.   Sleeping  without nocturnal  or early am exacerbation  of respiratory  c/o's or need for noct saba. Also denies any obvious fluctuation of symptoms with weather or environmental changes or other aggravating or alleviating factors except as outlined above   No unusual exposure hx or h/o childhood pna/ asthma or knowledge of premature birth.  Current Allergies, Complete Past Medical History, Past Surgical History, Family History, and Social History were reviewed in [employer] record.  ROS  The following are not active complaints unless bolded Hoarseness, sore throat, dysphagia, dental problems, itching, sneezing,  nasal congestion or discharge of excess mucus or purulent secretions, ear ache,   fever, chills, sweats, unintended wt loss or wt gain, classically pleuritic or exertional cp,  orthopnea pnd or arm/hand swelling  or leg swelling, presyncope, palpitations, abdominal pain, anorexia,  nausea, vomiting, diarrhea  or change in bowel habits or change in bladder habits, change in stools or change in urine, dysuria, hematuria,  rash, arthralgias, visual complaints, headache, numbness, weakness or ataxia or problems with walking or coordination,  change in mood or  memory.        Current Meds  Medication Sig   acetaminophen (TYLENOL) 650 MG CR tablet Take 650 mg by mouth every 8 (eight) hours as needed for pain.   albuterol (PROVENTIL) (2.5 MG/3ML) 0.083% nebulizer solution Take 3 mLs (2.5 mg total) by nebulization every 4 (four) hours as needed for wheezing or shortness of breath.   albuterol (VENTOLIN HFA) 108 (90 Base) MCG/ACT inhaler Inhale 2 puffs into the lungs every 4 (four) hours as needed for wheezing or shortness of breath. for wheezing   amLODipine (NORVASC) 5 MG tablet Take 5 mg by mouth daily.   ARIPiprazole (ABILIFY) 5 MG tablet Take 5 mg by mouth daily.   Budeson-Glycopyrrol-Formoterol (BREZTRI AEROSPHERE) 160-9-4.8 MCG/ACT AERO Inhale 2 puffs into the lungs 2 (two) times daily.   busPIRone (BUSPAR) 5 MG tablet Take 5 mg by mouth 2 (two) times daily.   cyclobenzaprine (FLEXERIL) 10 MG tablet Take 10 mg by mouth 2 (two) times daily.   dicyclomine (BENTYL) 20 MG tablet Take 20 mg by mouth 4 (four) times daily.   famotidine (PEPCID) 20 MG tablet TAKE 1 TABLET DAILY AFTER SUPPER (Patient taking differently: Take 20 mg by mouth at bedtime.)   fluticasone (FLONASE) 50 MCG/ACT nasal spray Place 2 sprays into both nostrils daily.   HYDROcodone-acetaminophen (NORCO) 10-325 MG tablet Take 1 tablet by mouth 3 (three) times daily as needed for pain.   LORazepam (ATIVAN) 1 MG tablet Take 1 mg by mouth every evening.   megestrol (MEGACE) 20 MG tablet Take 20 mg by mouth daily.   metFORMIN (GLUCOPHAGE) 500 MG tablet Take 500 mg by mouth 2 (two) times daily with a meal.   naloxone (NARCAN) nasal spray 4 mg/0.1 mL Place 0.4 mg into the nose  once.   nitroGLYCERIN (NITROSTAT) 0.4 MG SL  tablet Place 0.4 mg under the tongue every 5 (five) minutes as needed for chest pain.   omeprazole (PRILOSEC) 40 MG capsule TAKE 1 CAPSULE 30 TO 60 MINUTES BEFORE FIRST MEAL OF THE DAY   ondansetron (ZOFRAN) 4 MG tablet Take 4 mg by mouth daily.   potassium chloride SA (KLOR-CON M) 20 MEQ tablet Take 1 tablet (20 mEq total) by mouth daily.   rosuvastatin (CRESTOR) 20 MG tablet Take 20 mg by mouth at bedtime.   sertraline (ZOLOFT) 100 MG tablet Take 100 mg by mouth at bedtime.           Past Medical History:  Diagnosis Date   Anxiety    Bipolar affective disorder ([place])    CAD (coronary artery disease)    Chronic respiratory failure (HCC)    COPD (chronic obstructive pulmonary disease) (HCC)    DM (diabetes mellitus) (HCC)    GERD (gastroesophageal reflux disease)    HTN (hypertension)    Polymyalgia rheumatica (HCC)           Objective:    Wt  03/25/2022        103  09/24/2021        107 03/15/2021         107  12/14/20 107 lb 12.8 oz (48.9 kg)  11/02/20 112 lb 12.8 oz (51.2 kg)  08/15/20 132 lb 4.4 oz (60 kg)     Vital signs reviewed  03/25/2022  - Note at rest 02 sats  94% on 3lpm Pulsed   General appearance:    pleasant amb bf/ edentulous     HEENT :  Oropharynx  clear   Nasal turbinates nl    NECK :  without JVD/Nodes/TM/ nl carotid upstrokes bilaterally   LUNGS: no acc muscle use,  Mod barrel  contour chest wall with bilateral  Distant bs s audible wheeze and  without cough on insp or exp maneuvers and mod  Hyperresonant  to  percussion bilaterally     CV:  RRR  no s3 or murmur or increase in P2, and no edema   ABD:  soft and nontender with pos mid insp Hoover's  in the supine position. No bruits or organomegaly appreciated, bowel sounds nl  MS:   Ext warm without deformities or   obvious joint restrictions , calf tenderness, cyanosis - Pos mod clubbing   SKIN: warm and dry without lesions    NEURO:  alert, approp, nl sensorium with  no motor or cerebellar  deficits apparent.                    I personally reviewed images and agree with radiology impression as follows:   Chest LDSCT  12/21/21 1. Lung-RADS 2, benign appearance or behavior. Continue annual screening with low-dose chest CT without contrast in 12 months. 2. Enlargement of the pulmonary outflow tract/main pulmonary arteries suggests pulmonary arterial hypertension. 3. Aortic Atherosclerosis (ICD10-I70.0) and Emphysema (ICD10-J43.9    Assessment

## 2022-03-25 NOTE — Patient Instructions (Addendum)
We will ask [facility] to do best fit evaluation for portable oxygen   Plan A = Automatic = Always=    Breztri Take 2 puffs first thing in am and then another 2 puffs about 12 hours later.    Work on inhaler technique:  relax and gently blow all the way out then take a nice smooth full deep breath back in, triggering the inhaler at same time you start breathing in.  Hold breath in for at least  5 seconds if you can. Blow out breztri  thru nose. Rinse and gargle with water when done.  If mouth or throat bother you at all,  try brushing teeth/gums/tongue with arm and hammer toothpaste/ make a slurry and gargle and spit out.       Plan B = Backup (to supplement plan A, not to replace it) Only use your albuterol inhaler as a rescue medication to be used if you can't catch your breath by resting or doing a relaxed purse lip breathing pattern.  - The less you use it, the better it will work when you need it. - Ok to use the inhaler up to 2 puffs  every 4 hours if you must but call for appointment if use goes up over your usual need - Don't leave home without it !!  (think of it like the spare tire for your car)   Plan C = Crisis (instead of Plan B but only if Plan B stops working) - only use your albuterol nebulizer if you first try Plan B and it fails to help > ok to use the nebulizer up to every 4 hours but if start needing it regularly call for immediate appointment  Zpak  Prednisone 10 mg take  4 each am x 2 days,   2 each am x 2 days,  1 each am x 2 days and stop   The key is to stop smoking completely before smoking completely stops you!.  Please schedule a follow up office visit in 4 weeks, sooner if needed - bring your inhalers with you

## 2022-03-26 ENCOUNTER — Encounter: Payer: Self-pay | Admitting: Internal Medicine

## 2022-03-26 NOTE — Assessment & Plan Note (Addendum)
02 x around 2019 by Dr Juanetta Gosling -  11/02/2020   Walked RA  approx   450 ft  @ mod pace  stopped due to  [employer] /fatigue with sats still 94% - 03/15/2021   Walked on RA x  3  lap(s) =  approx 450  @ slow to mod pace, stopped due to end of study  with lowest 02 sats 94%    rec as of 03/25/2022  = 3lpm hs and prn daytime with goal of keeping sats > 90%    >>> referred for best fit for amb 02 ? POC candidate         Each maintenance medication was reviewed in detail including emphasizing most importantly the difference between maintenance and prns and under what circumstances the prns are to be triggered using an action plan format where appropriate.  Total time for H and P, chart review, counseling, reviewing hfa/neb/02  device(s) and generating customized AVS unique to this office visit / same day charting =  20 min

## 2022-03-26 NOTE — Assessment & Plan Note (Addendum)
Active smoker -  PFT's  11/02/2020  FEV1 1.42 (85 % ) ratio 0.67  p 0 % improvement from saba p ? prior to study with DLCO  5.50 (31%) corrects to 1.55 (36%)  for alv volume and FV curve mild concavity   - 11/02/2020  try breztri 2bid and max gerd rx   - Allergy profile 03/15/21  >  Eos 0.1 /  IgE  72 - 09/24/2021  After extensive coaching inhaler device,  effectiveness =  75%  For mild flare:  Zpak/ Prednisone 10 mg take  4 each am x 2 days,   2 each am x 2 days,  1 each am x 2 days and stop    Clearly Group D (now reclassified as E) in terms of symptom/risk and laba/lama/ICS  therefore appropriate rx at this point >>>  breztri approp and more approp saba use  Re SABA :  I spent extra time with pt today reviewing appropriate use of albuterol for prn use on exertion with the following points: 1) saba is for relief of sob that does not improve by walking a slower pace or resting but rather if the pt does not improve after trying this first. 2) If the pt is convinced, as many are, that saba helps recover from activity faster then it's easy to tell if this is the case by re-challenging : ie stop, take the inhaler, then p 5 minutes try the exact same activity (intensity of workload) that just caused the symptoms and see if they are substantially diminished or not after saba 3) if there is an activity that reproducibly causes the symptoms, try the saba 15 min before the activity on alternate days   If in fact the saba really does help, then fine to continue to use it prn but advised may need to look closer at the maintenance regimen being used to achieve better control of airways disease with exertion.

## 2022-03-26 NOTE — Assessment & Plan Note (Signed)

## 2022-04-09 ENCOUNTER — Other Ambulatory Visit: Payer: Self-pay

## 2022-04-15 ENCOUNTER — Other Ambulatory Visit: Payer: Self-pay

## 2022-04-15 MED ORDER — BREZTRI AEROSPHERE 160-9-4.8 MCG/ACT IN AERO: 2.0000 | INHALATION_SPRAY | Freq: Two times a day (BID) | RESPIRATORY_TRACT | 11 refills | Status: DC

## 2022-04-15 MED ORDER — FAMOTIDINE 20 MG PO TABS: ORAL_TABLET | ORAL | 3 refills | Status: DC

## 2022-04-15 MED ORDER — OMEPRAZOLE 40 MG PO CPDR: DELAYED_RELEASE_CAPSULE | ORAL | 3 refills | Status: DC

## 2022-04-15 MED ORDER — ALBUTEROL SULFATE (2.5 MG/3ML) 0.083% IN NEBU: 2.5000 mg | INHALATION_SOLUTION | RESPIRATORY_TRACT | 3 refills | Status: DC | PRN

## 2022-04-15 MED ORDER — ALBUTEROL SULFATE HFA 108 (90 BASE) MCG/ACT IN AERS: 2.0000 | INHALATION_SPRAY | RESPIRATORY_TRACT | 3 refills | Status: DC | PRN

## 2022-04-15 NOTE — Telephone Encounter (Signed)
Received request from [facility] to send:  Omeprazole  Famotidine  Breztri  Albuterol neb and inhaler

## 2022-04-19 ENCOUNTER — Telehealth: Payer: Self-pay | Admitting: Internal Medicine

## 2022-04-19 DIAGNOSIS — J449 Chronic obstructive pulmonary disease, unspecified: Secondary | ICD-10-CM

## 2022-04-19 DIAGNOSIS — J9611 Chronic respiratory failure with hypoxia: Secondary | ICD-10-CM

## 2022-04-19 NOTE — Telephone Encounter (Signed)
Per notes [facility] has received an order. Will place another order for patient. Nothing further needed. Will verify situation with patient during ov on Tuesday.

## 2022-04-23 ENCOUNTER — Encounter: Payer: Self-pay | Admitting: Internal Medicine

## 2022-04-23 ENCOUNTER — Ambulatory Visit: Payer: Medicare HMO | Admitting: Internal Medicine

## 2022-04-23 DIAGNOSIS — F17210 Nicotine dependence, cigarettes, uncomplicated: Secondary | ICD-10-CM

## 2022-04-23 DIAGNOSIS — J9611 Chronic respiratory failure with hypoxia: Secondary | ICD-10-CM | POA: Diagnosis not present

## 2022-04-23 DIAGNOSIS — J449 Chronic obstructive pulmonary disease, unspecified: Secondary | ICD-10-CM | POA: Diagnosis not present

## 2022-04-23 NOTE — Patient Instructions (Signed)
No change in medications  Work on inhaler technique:  relax and gently blow all the way out then take a nice smooth full deep breath back in, triggering the inhaler at same time you start breathing in.  Hold breath in for at least  5 seconds if you can. Blow out breztri  thru nose. Rinse and gargle with water when done.  If mouth or throat bother you at all,  try brushing teeth/gums/tongue with arm and hammer toothpaste/ make a slurry and gargle and spit out.   The key is to stop smoking completely before smoking completely stops you!   We will refer you again for best fit for portable 02 ? POC if possible   Make sure you check your oxygen saturation  AT  your highest level of activity (not after you stop)   to be sure it stays over 90% and adjust  02 flow upward to maintain this level if needed but remember to turn it back to previous settings when you stop (to conserve your supply).   Ok to try albuterol 15 min before an activity (on alternating days)  that you know would usually make you short of breath and see if it makes any difference and if makes none then don't take albuterol after activity unless you can't catch your breath as this means it's the resting that helps, not the albuterol.      Please schedule a follow up visit in 3 months but call sooner if needed

## 2022-04-23 NOTE — Assessment & Plan Note (Signed)
Counseled re importance of smoking cessation but did not meet time criteria for separate billing    Each maintenance medication was reviewed in detail including emphasizing most importantly the difference between maintenance and prns and under what circumstances the prns are to be triggered using an action plan format where appropriate.  Total time for H and P, chart review, counseling, reviewing dpi/hfa/neb/02 device(s) , directly observing portions of ambulatory 02 saturation study/ and generating customized AVS unique to this office visit / same day charting > 30 min

## 2022-04-23 NOTE — Assessment & Plan Note (Signed)
Active smoker -  PFT's  11/02/2020  FEV1 1.42 (85 % ) ratio 0.67  p 0 % improvement from saba p ? prior to study with DLCO  5.50 (31%) corrects to 1.55 (36%)  for alv volume and FV curve mild concavity   - 11/02/2020  try breztri 2bid and max gerd rx   - Allergy profile 03/15/21  >  Eos 0.1 /  IgE  72 - 04/23/2022  After extensive coaching inhaler device,  effectiveness =   80%  (short ti)      Group D (now reclassified as E) in terms of symptom/risk and laba/lama/ICS  therefore appropriate rx at this point >>>  breztri 2 bid and more approp timing on am breztri (first thing in am ) and use of saba hfa/ neb as backups   Re SABA :  I spent extra time with pt today reviewing appropriate use of albuterol for prn use on exertion with the following points: 1) saba is for relief of sob that does not improve by walking a slower pace or resting but rather if the pt does not improve after trying this first. 2) If the pt is convinced, as many are, that saba helps recover from activity faster then it's easy to tell if this is the case by re-challenging : ie stop, take the inhaler, then p 5 minutes try the exact same activity (intensity of workload) that just caused the symptoms and see if they are substantially diminished or not after saba 3) if there is an activity that reproducibly causes the symptoms, try the saba 15 min before the activity on alternate days   If in fact the saba really does help, then fine to continue to use it prn but advised may need to look closer at the maintenance regimen being used to achieve better control of airways disease with exertion.

## 2022-04-23 NOTE — Progress Notes (Signed)
Candace Harris, female    DOB: 07/25/1950    MRN: [id number]   Brief patient profile:  12 yobf active smoker with GOLD I COPD by pfts 02/2019 transferred care to [facility] office 11/02/2020 p admit (see below)   From Dr Carlis Abbott 11/24/19 Elderly BF with a history of tobacco abuse and COPD who presents for evaluation of dyspnea on exertion.   She was diagnosed with COPD about 2 years ago but has had progressively worsening shortness of breath over the last 2 years.  She also has cough, sputum production, wheezing, but dyspnea on exertion is her main complaint.  Her activity is fairly limited; she is only able to walk a few steps around her house without stopping.   She is on 3 L supplemental oxygen at home.  She was prescribed 2.5 L but had ongoing dyspnea on titrated up to 3 L.  Her home saturations are usually in the 90s, but sometimes drop into the 80s.  She is currently using her Trelegy inhaler daily with frequent albuterol.  She continues to smoke 0.25- 0.5 packs/day; she has smoked 0.5 ppd for the last 50 years.     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  Rec: trelegy Check echo: 03/19/61  G I diastolic dysfunction / no cor pulmonale    Stopped trelegy  mid Jan 2022 due irritated mouth   Admit date: 08/15/2020 Discharge date: 08/18/2020 Brief Hospitalization Summary: Please see all hospital notes, images, labs for full details of the hospitalization. ADMISSION HPI: Candace Harris is a 71 y.o. female with medical history significant for HTN, CAD, anxiety, COPD (on 3-6 L of O2 at home), T2DM, GERD and bipolar disorder who presents to the emergency department due to 18-month onset of shortness of breath that has been progressively worsening, she complained of decreased oral intake due to loss of taste, she also endorsed loss of smell and she states that she had diarrhea for 2 to 3 weeks last month which has resolved.  She states that she saw her PCP in [place] who prescribed prednisone  for her, but she has not yet filled the prescription.  She decided to go to the ED for further evaluation due to persistent worsening shortness of breath and concern for dehydration.   ED Course: In the emergency department, she was tachycardic and intermittently tachypneic. Work-up in the ED showed leukocytosis, thrombocytosis, hypokalemia, hyponatremia, BUN/creatinine 41/1.70 (baseline creatinine was 0.7). D-dimer 0.78. Chest x-ray was reflective of emphysema but showed no active cardiopulmonary disease Breathing treatment was provided, Solu-Medrol 125 Mg x1 was given, IV Ativan Librium x1 due to anxiety was given and patient was provided with 500 mL of IV NS. Hospitalist was asked to admit patient for further evaluation and management.   Hospital Course    Acute on chronic respiratory failure with hypoxia - secondary to COPD exacerbation.  Pt much improved after IV steroids, bronchodilators and supportive measures. .  Prednisone taper.   Hypomagnesemia - IV replacement given and repleted.  Hypokalemia - repleted.  Essential hypertension - continue home meds GERD - protonix for GI protection.  Type 2 DM - monitored and stable.   AKI - resolved after hydration.    Discharge Diagnoses:  Principal Problem:   Shortness of breath Active Problems:   AKI (acute kidney injury) (HCC)   Dehydration   Leukocytosis   Thrombocytosis   Essential hypertension   Elevated d-dimer   Hypokalemia   Anxiety   COPD  with acute exacerbation (HCC)   Hyperlipidemia   Diabetes mellitus (HCC)   GERD (gastroesophageal reflux disease)   CAD (coronary artery disease)        History of Present Illness  11/02/2020  Pulmonary/ 1st office eval/ Candace Harris / [facility] GOLD I copd with component of UACS/ anxiety  Chief Complaint  Patient presents with   Follow-up    Productive cough with white phlegm since January 2022  Dyspnea:  Can do 17 steps at appt complex but has to stop at top to recover / last  shopping x 2 y prior to OV   Cough: hocking min white daytime off gerd rx  Sleep: on side flat bed s resp symptoms "as long as I have on my oxygen"  SABA use: avg 6-8 x per day / no maint rx  02 2lpm 24/7 rec Plan A = Automatic = Always=    Breztri Take 2 puffs first thing in am and then another 2 puffs about 12 hours later.  Work on inhaler technique: Plan B = Backup (to supplement plan A, not to replace it) Only use your albuterol inhaler as a rescue medication Try prilosec 40mg   Take 30-60 min before first meal of the day and Pepcid ac (famotidine) 20 mg one after supper  Until return  GERD diet  Please schedule a follow up office visit in 6 weeks, call sooner if needed with all medications /inhalers/ solutions in hand so we can verify exactly what you are taking. This includes all medications from all doctors and over the counters        03/15/2021  f/u ov/[place] office/Candace Harris re: GOLD I/  uacs/ maint on breztri   Chief Complaint  Patient presents with   Follow-up    Patient uses 2.5 L O2 all of time. She says that when she is sitting she doesn't feel that she needs it but when she is up moving or doing anything she needs the 2.5 L O2. She does sleep with the oxygen on because she wakes up in the morning feeling SOB when shes not using it overnight. She would like to be weaned off the oxygen. Uses nebulizer once in the morning and once at night.   Dyspnea:  only goes out to doctor's office/ room to room at home  Cough: sensation of globus / almost threw up despite max gerd rx  Sleeping: flat bed /on side/ 2 pillows no resp symptoms  SABA use: only uses p ex but neb twice weeks esp in am  02: using 02 p ex and hs  Covid status: no vax/ no infection  Rec Plan A = Automatic = Always=    breztri Take 2 puffs first thing in am and then another 2 puffs about 12 hours later.  Work on inhaler technique:   Plan B = Backup (to supplement plan A, not to replace it) Only use your albuterol  inhaler as a rescue medication  Plan C = Crisis (instead of Plan B but only if Plan B stops working) - only use your albuterol nebulizer if you first try Plan B  Make sure you check your oxygen saturation  at your highest level of activity  to be sure it stays over 90%          03/25/2022  f/u ov/[facility]/Candace Harris re: GOLD 1 with emphysema on CT and ? Cor pulmonale with nl echo 12/15/19   maint on breztri / still smoking  Chief Complaint  Patient presents  with   Follow-up    Is now using 3LO2 during the day prn.  Feels breathing has worsened since last ov.  Wakes up during night feeling SOB   Dyspnea:  mostly housebound  Cough: smoker's rattle / min am mucoid sputum Sleeping: on side 4 pillows  SABA use: way  too much  02: 3lpm hs and prn daytime  Covid status: never vax/ infected  Rec We will ask [employer] to do best fit evaluation for portable oxygen  Plan A = Automatic = Always=    Breztri Take 2 puffs first thing in am and then another 2 puffs about 12 hours later.  Work on inhaler technique:  Plan B = Backup (to supplement plan A, not to replace it) Only use your albuterol inhaler as a rescue medication Plan C = Crisis (instead of Plan B but only if Plan B stops working) - only use your albuterol nebulizer if you first try Plan B  Zpak Prednisone 10 mg take  4 each am x 2 days,   2 each am x 2 days,  1 each am x 2 days and stop  The key is to stop smoking completely before smoking completely stops you!.  Please schedule a follow up office visit in 4 weeks, sooner if needed - bring your inhalers with you    04/23/2022  f/u ov/[place] office/Candace Harris re: GOLD 1 maint on breztri but hasn't taken it on day of ov Some better on prednisone then worse off it  / did not bring inhalers as requested and confused with details of care  Chief Complaint  Patient presents with   Follow-up    Breathing is doing well.  Has some abd days with breathing on occasion   Dyspnea:  wants to get  out for 30 min of walking per day - has rollator and having trouble lifting 02 and putting on rollator  Cough: smoker's rattle  Sleeping: flat bed /on side/ 4pillows  SABA use: not using on good days,  2-4 x per day day / not using neb  02: 3lpm hs /  2.5 pulsed prn daytime  Covid status: never vax, never infected  Lung cancer screening: already in program    No obvious day to day or daytime variability or assoc excess/ purulent sputum or mucus plugs or hemoptysis or cp or chest tightness, subjective wheeze or overt sinus or hb symptoms.   Sleeping  without nocturnal  or early am exacerbation  of respiratory  c/o's or need for noct saba. Also denies any obvious fluctuation of symptoms with weather or environmental changes or other aggravating or alleviating factors except as outlined above   No unusual exposure hx or h/o childhood pna/ asthma or knowledge of premature birth.  Current Allergies, Complete Past Medical History, Past Surgical History, Family History, and Social History were reviewed in [employer] record.  ROS  The following are not active complaints unless bolded Hoarseness, sore throat, dysphagia, dental problems, itching, sneezing,  nasal congestion or discharge of excess mucus or purulent secretions, ear ache,   fever, chills, sweats, unintended wt loss or wt gain, classically pleuritic or exertional cp,  orthopnea pnd or arm/hand swelling  or leg swelling, presyncope, palpitations, abdominal pain, anorexia, nausea, vomiting, diarrhea  or change in bowel habits or change in bladder habits, change in stools or change in urine, dysuria, hematuria,  rash, arthralgias, visual complaints, headache, numbness, weakness or ataxia or problems with walking/uses rollator or coordination,  change  in mood or  memory.        Current Meds  Medication Sig   acetaminophen (TYLENOL) 650 MG CR tablet Take 650 mg by mouth every 8 (eight) hours as needed for pain.    albuterol (PROVENTIL) (2.5 MG/3ML) 0.083% nebulizer solution Take 3 mLs (2.5 mg total) by nebulization every 4 (four) hours as needed for wheezing or shortness of breath.   albuterol (VENTOLIN HFA) 108 (90 Base) MCG/ACT inhaler Inhale 2 puffs into the lungs every 4 (four) hours as needed for wheezing or shortness of breath. for wheezing   amLODipine (NORVASC) 5 MG tablet Take 5 mg by mouth daily.   ARIPiprazole (ABILIFY) 5 MG tablet Take 5 mg by mouth daily.        Budeson-Glycopyrrol-Formoterol (BREZTRI AEROSPHERE) 160-9-4.8 MCG/ACT AERO Inhale 2 puffs into the lungs 2 (two) times daily.   busPIRone (BUSPAR) 5 MG tablet Take 5 mg by mouth 2 (two) times daily.   cyclobenzaprine (FLEXERIL) 10 MG tablet Take 10 mg by mouth 2 (two) times daily.   dicyclomine (BENTYL) 20 MG tablet Take 20 mg by mouth 4 (four) times daily.   famotidine (PEPCID) 20 MG tablet TAKE 1 TABLET DAILY AFTER SUPPER   fluticasone (FLONASE) 50 MCG/ACT nasal spray Place 2 sprays into both nostrils daily.   HYDROcodone-acetaminophen (NORCO) 10-325 MG tablet Take 1 tablet by mouth 3 (three) times daily as needed for pain.   LORazepam (ATIVAN) 1 MG tablet Take 1 mg by mouth every evening.   megestrol (MEGACE) 20 MG tablet Take 20 mg by mouth daily.   metFORMIN (GLUCOPHAGE) 500 MG tablet Take 500 mg by mouth 2 (two) times daily with a meal.   naloxone (NARCAN) nasal spray 4 mg/0.1 mL Place 0.4 mg into the nose once.   nitroGLYCERIN (NITROSTAT) 0.4 MG SL tablet Place 0.4 mg under the tongue every 5 (five) minutes as needed for chest pain.   omeprazole (PRILOSEC) 40 MG capsule TAKE 1 CAPSULE 30 TO 60 MINUTES BEFORE FIRST MEAL OF THE DAY   ondansetron (ZOFRAN) 4 MG tablet Take 4 mg by mouth daily.   potassium chloride SA (KLOR-CON M) 20 MEQ tablet Take 1 tablet (20 mEq total) by mouth daily.        rosuvastatin (CRESTOR) 20 MG tablet Take 20 mg by mouth at bedtime.   sertraline (ZOLOFT) 100 MG tablet Take 100 mg by mouth at bedtime.             Past Medical History:  Diagnosis Date   Anxiety    Bipolar affective disorder (HCC)    CAD (coronary artery disease)    Chronic respiratory failure (HCC)    COPD (chronic obstructive pulmonary disease) (HCC)    DM (diabetes mellitus) (HCC)    GERD (gastroesophageal reflux disease)    HTN (hypertension)    Polymyalgia rheumatica (HCC)           Objective:    Wt  04/23/2022      104  03/25/2022        103  09/24/2021        107 03/15/2021         107  12/14/20 107 lb 12.8 oz (48.9 kg)  11/02/20 112 lb 12.8 oz (51.2 kg)  08/15/20 132 lb 4.4 oz (60 kg)    Vital signs reviewed  04/23/2022  - Note at rest 02 sats  89% on 2.5 pulsed    General appearance:    thin bf nad   HEENT :  Oropharynx  clear/edentulous  Nasal turbinates nl   NECK :  without JVD/Nodes/TM/ nl carotid upstrokes bilaterally   LUNGS: no acc muscle use,  Mod barrel  contour chest wall with bilateral  Distant bs s audible wheeze and  without cough on insp or exp maneuvers and mod  Hyperresonant  to  percussion bilaterally     CV:  RRR  no s3 or murmur or increase in P2, and no edema   ABD:  soft and nontender with pos mid insp Hoover's  in the supine position. No bruits or organomegaly appreciated, bowel sounds nl  MS:   Ext warm without deformities or   obvious joint restrictions , calf tenderness, cyanosis or clubbing  SKIN: warm and dry without lesions    NEURO:  alert, approp, nl sensorium with  no motor or cerebellar deficits apparent.                      Assessment

## 2022-04-23 NOTE — Assessment & Plan Note (Signed)
02 x around 2019 by Dr Luan Pulling -  11/02/2020   Walked RA  approx   450 ft  @ mod pace  stopped due to  [employer] /fatigue with sats still 94% - 03/15/2021   Walked on RA x  3  lap(s) =  approx 450  @ slow to mod pace, stopped due to end of study  with lowest 02 sats 94%   -  04/23/2022 Patient Saturations on Room Air at Rest = 88% Patient Saturations on 2.5LO2 of oxygen at rest= 93% Patient Saturations on 2.5 Liters of oxygen while Ambulating =91% lap one = 150 ft  2.5 Liters of oxygen while Ambulating= 90% lap two = 300 ft, slow rollator pace    Rec refer again for bes fit amb 02 eval   Reviewed: Make sure you check your oxygen saturation  AT  your highest level of activity (not after you stop)   to be sure it stays over 90% and adjust  02 flow upward to maintain this level if needed but remember to turn it back to previous settings when you stop (to conserve your supply).

## 2022-04-29 ENCOUNTER — Telehealth: Payer: Self-pay | Admitting: Internal Medicine

## 2022-04-30 MED ORDER — ALBUTEROL SULFATE HFA 108 (90 BASE) MCG/ACT IN AERS: 2.0000 | INHALATION_SPRAY | RESPIRATORY_TRACT | 3 refills | Status: DC | PRN

## 2022-04-30 NOTE — Telephone Encounter (Signed)
Called and spoke to [place] with [facility]. She states they were calling because they needed pt's ventolin order changed form 24 g to 6.7 g or (20.1 g- 90 days supply). Changed Rx. Nothing further needed

## 2022-05-07 ENCOUNTER — Telehealth: Payer: Self-pay | Admitting: Internal Medicine

## 2022-05-15 ENCOUNTER — Ambulatory Visit: Payer: Medicare HMO | Admitting: Gastroenterology

## 2022-05-30 ENCOUNTER — Encounter: Payer: Self-pay | Admitting: Internal Medicine

## 2022-05-30 ENCOUNTER — Ambulatory Visit: Payer: Medicare Other | Admitting: Internal Medicine

## 2022-05-30 VITALS — BP 132/82 | HR 114 | Temp 98.2°F | Ht 61.5 in | Wt 107.6 lb

## 2022-05-30 DIAGNOSIS — J449 Chronic obstructive pulmonary disease, unspecified: Secondary | ICD-10-CM | POA: Diagnosis not present

## 2022-05-30 DIAGNOSIS — J9611 Chronic respiratory failure with hypoxia: Secondary | ICD-10-CM | POA: Diagnosis not present

## 2022-05-30 DIAGNOSIS — F17210 Nicotine dependence, cigarettes, uncomplicated: Secondary | ICD-10-CM

## 2022-05-30 NOTE — Patient Instructions (Signed)
No change in medications  The key is to stop smoking completely before smoking completely stops you!   We will refer you again for best fit for portable 02 ? POC if possible   Make sure you check your oxygen saturation  AT  your highest level of activity (not after you stop)   to be sure it stays over 90% and adjust  02 flow upward to maintain this level if needed but remember to turn it back to previous settings when you stop (to conserve your supply).   Ok to try albuterol 15 min before an activity (on alternating days with inhaler vs nebulizer)   that you know would usually make you short of breath and see if it makes any difference and if makes none then don't take albuterol after activity unless you can't catch your breath as this means it's the resting that helps, not the albuterol.      Please schedule a follow up visit in  6 months but call sooner if needed

## 2022-05-30 NOTE — Progress Notes (Signed)
Candace Harris, female    DOB: 07/25/1950    MRN: [id number]   Brief patient profile:  12 yobf active smoker with GOLD I COPD by pfts 02/2019 transferred care to [facility] office 11/02/2020 p admit (see below)   From Dr Carlis Abbott 11/24/19 Elderly BF with a history of tobacco abuse and COPD who presents for evaluation of dyspnea on exertion.   She was diagnosed with COPD about 2 years ago but has had progressively worsening shortness of breath over the last 2 years.  She also has cough, sputum production, wheezing, but dyspnea on exertion is her main complaint.  Her activity is fairly limited; she is only able to walk a few steps around her house without stopping.   She is on 3 L supplemental oxygen at home.  She was prescribed 2.5 L but had ongoing dyspnea on titrated up to 3 L.  Her home saturations are usually in the 90s, but sometimes drop into the 80s.  She is currently using her Trelegy inhaler daily with frequent albuterol.  She continues to smoke 0.25- 0.5 packs/day; she has smoked 0.5 ppd for the last 50 years.     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  Rec: trelegy Check echo: 03/19/61  G I diastolic dysfunction / no cor pulmonale    Stopped trelegy  mid Jan 2022 due irritated mouth   Admit date: 08/15/2020 Discharge date: 08/18/2020 Brief Hospitalization Summary: Please see all hospital notes, images, labs for full details of the hospitalization. ADMISSION HPI: Candace Harris is a 71 y.o. female with medical history significant for HTN, CAD, anxiety, COPD (on 3-6 L of O2 at home), T2DM, GERD and bipolar disorder who presents to the emergency department due to 18-month onset of shortness of breath that has been progressively worsening, she complained of decreased oral intake due to loss of taste, she also endorsed loss of smell and she states that she had diarrhea for 2 to 3 weeks last month which has resolved.  She states that she saw her PCP in [place] who prescribed prednisone  for her, but she has not yet filled the prescription.  She decided to go to the ED for further evaluation due to persistent worsening shortness of breath and concern for dehydration.   ED Course: In the emergency department, she was tachycardic and intermittently tachypneic. Work-up in the ED showed leukocytosis, thrombocytosis, hypokalemia, hyponatremia, BUN/creatinine 41/1.70 (baseline creatinine was 0.7). D-dimer 0.78. Chest x-ray was reflective of emphysema but showed no active cardiopulmonary disease Breathing treatment was provided, Solu-Medrol 125 Mg x1 was given, IV Ativan Librium x1 due to anxiety was given and patient was provided with 500 mL of IV NS. Hospitalist was asked to admit patient for further evaluation and management.   Hospital Course    Acute on chronic respiratory failure with hypoxia - secondary to COPD exacerbation.  Pt much improved after IV steroids, bronchodilators and supportive measures. .  Prednisone taper.   Hypomagnesemia - IV replacement given and repleted.  Hypokalemia - repleted.  Essential hypertension - continue home meds GERD - protonix for GI protection.  Type 2 DM - monitored and stable.   AKI - resolved after hydration.    Discharge Diagnoses:  Principal Problem:   Shortness of breath Active Problems:   AKI (acute kidney injury) (HCC)   Dehydration   Leukocytosis   Thrombocytosis   Essential hypertension   Elevated d-dimer   Hypokalemia   Anxiety   COPD  with acute exacerbation (HCC)   Hyperlipidemia   Diabetes mellitus (HCC)   GERD (gastroesophageal reflux disease)   CAD (coronary artery disease)        History of Present Illness  11/02/2020  Pulmonary/ 1st office eval/ Bradin Mcadory / [facility] GOLD I copd with component of UACS/ anxiety  Chief Complaint  Patient presents with   Follow-up    Productive cough with white phlegm since January 2022  Dyspnea:  Can do 17 steps at appt complex but has to stop at top to recover / last  shopping x 2 y prior to OV   Cough: hocking min white daytime off gerd rx  Sleep: on side flat bed s resp symptoms "as long as I have on my oxygen"  SABA use: avg 6-8 x per day / no maint rx  02 2lpm 24/7 rec Plan A = Automatic = Always=    Breztri Take 2 puffs first thing in am and then another 2 puffs about 12 hours later.  Work on inhaler technique: Plan B = Backup (to supplement plan A, not to replace it) Only use your albuterol inhaler as a rescue medication Try prilosec 40mg   Take 30-60 min before first meal of the day and Pepcid ac (famotidine) 20 mg one after supper  Until return  GERD diet  Please schedule a follow up office visit in 6 weeks, call sooner if needed with all medications /inhalers/ solutions in hand so we can verify exactly what you are taking. This includes all medications from all doctors and over the counters        03/15/2021  f/u ov/[place] office/Edras Wilford re: GOLD I/  uacs/ maint on breztri   Chief Complaint  Patient presents with   Follow-up    Patient uses 2.5 L O2 all of time. She says that when she is sitting she doesn't feel that she needs it but when she is up moving or doing anything she needs the 2.5 L O2. She does sleep with the oxygen on because she wakes up in the morning feeling SOB when shes not using it overnight. She would like to be weaned off the oxygen. Uses nebulizer once in the morning and once at night.   Dyspnea:  only goes out to doctor's office/ room to room at home  Cough: sensation of globus / almost threw up despite max gerd rx  Sleeping: flat bed /on side/ 2 pillows no resp symptoms  SABA use: only uses p ex but neb twice weeks esp in am  02: using 02 p ex and hs  Covid status: no vax/ no infection  Rec Plan A = Automatic = Always=    breztri Take 2 puffs first thing in am and then another 2 puffs about 12 hours later.  Work on inhaler technique:   Plan B = Backup (to supplement plan A, not to replace it) Only use your albuterol  inhaler as a rescue medication  Plan C = Crisis (instead of Plan B but only if Plan B stops working) - only use your albuterol nebulizer if you first try Plan B  Make sure you check your oxygen saturation  at your highest level of activity  to be sure it stays over 90%          03/25/2022  f/u ov/[place] office/Penney Domanski re: GOLD 1 with emphysema on CT and ? Cor pulmonale with nl echo 12/15/19   maint on breztri / still smoking  Chief Complaint  Patient presents  with   Follow-up    Is now using 3LO2 during the day prn.  Feels breathing has worsened since last ov.  Wakes up during night feeling SOB   Dyspnea:  mostly housebound  Cough: smoker's rattle / min am mucoid sputum Sleeping: on side 4 pillows  SABA use: way  too much  02: 3lpm hs and prn daytime  Covid status: never vax/ infected  Rec We will ask [employer] to do best fit evaluation for portable oxygen  Plan A = Automatic = Always=    Breztri Take 2 puffs first thing in am and then another 2 puffs about 12 hours later.  Work on inhaler technique: Plan B = Backup (to supplement plan A, not to replace it) Only use your albuterol inhaler as a rescue medication Plan C = Crisis (instead of Plan B but only if Plan B stops working) - only use your albuterol nebulizer if you first try Plan B  Zpak Prednisone 10 mg take  4 each am x 2 days,   2 each am x 2 days,  1 each am x 2 days and stop The key is to stop smoking completely before smoking completely stops you!. Please schedule a follow up office visit in 4 weeks, sooner if needed - bring your inhalers with you    04/23/2022  f/u ov/[place] office/Dartha Rozzell re: GOLD 1 maint on breztri but hasn't taken it on day of ov Some better on prednisone then worse off it  / did not bring inhalers as requested and confused with details of care  Chief Complaint  Patient presents with   Follow-up    Breathing is doing well.  Has some abd days with breathing on occasion   Dyspnea:  wants to get out  for 30 min of walking per day - has rollator and having trouble lifting 02 and putting on rollator  Cough: smoker's rattle  Sleeping: flat bed /on side/ 4pillows  SABA use: not using on good days,  2-4 x per day day / not using neb  02: 3lpm hs /  2.5 pulsed prn daytime  Covid status: never vax, never infected  Lung cancer screening: already in program  Rec No change in medications Work on inhaler technique:   We will refer you again for best fit for portable 02 ? POC if possible  Make sure you check your oxygen saturation  AT  your highest level of activity (not after you stop)   to be sure it stays over 90% Ok to try albuterol 15 min before an activity (on alternating days)  that you know would usually make you short of breath     05/30/2022  f/u ov/[place] office/Damione Robideau re: GOLD 1  maint on breztri 2bi  Chief Complaint  Patient presents with   Follow-up    Breathing doing okay since last ov    Dyspnea:  confined to house mostly, doe room to room  Cough: none  Sleeping: flat bed/ on side/ lots of pillows  x years SABA use: up to 3 x daily  02: 2.5 lpm 24/7  Covid status: never vax/ never infected  Lung cancer screening: in program    No obvious day to day or daytime variability or assoc excess/ purulent sputum or mucus plugs or hemoptysis or cp or chest tightness, subjective wheeze or overt sinus or hb symptoms.   Sleeping s  without nocturnal  or early am exacerbation  of respiratory  c/o's or need for noct saba.  Also denies any obvious fluctuation of symptoms with weather or environmental changes or other aggravating or alleviating factors except as outlined above   No unusual exposure hx or h/o childhood pna/ asthma or knowledge of premature birth.  Current Allergies, Complete Past Medical History, Past Surgical History, Family History, and Social History were reviewed in [employer] record.  ROS  The following are not active complaints unless  bolded Hoarseness, sore throat, dysphagia, dental problems, itching, sneezing,  nasal congestion or discharge of excess mucus or purulent secretions, ear ache,   fever, chills, sweats, unintended wt loss or wt gain, classically pleuritic or exertional cp,  orthopnea pnd or arm/hand swelling  or leg swelling, presyncope, palpitations, abdominal pain, anorexia, nausea, vomiting, diarrhea  or change in bowel habits or change in bladder habits, change in stools or change in urine, dysuria, hematuria,  rash, arthralgias, visual complaints, headache, numbness, weakness or ataxia or problems with walking or coordination/ using 2 wheeled walker,  change in mood or  memory.        Current Meds  Medication Sig   acetaminophen (TYLENOL) 650 MG CR tablet Take 650 mg by mouth every 8 (eight) hours as needed for pain.   albuterol (PROVENTIL) (2.5 MG/3ML) 0.083% nebulizer solution Take 3 mLs (2.5 mg total) by nebulization every 4 (four) hours as needed for wheezing or shortness of breath.   albuterol (VENTOLIN HFA) 108 (90 Base) MCG/ACT inhaler Inhale 2 puffs into the lungs every 4 (four) hours as needed for wheezing or shortness of breath. for wheezing   amLODipine (NORVASC) 5 MG tablet Take 5 mg by mouth daily.   ARIPiprazole (ABILIFY) 5 MG tablet Take 5 mg by mouth daily.   Budeson-Glycopyrrol-Formoterol (BREZTRI AEROSPHERE) 160-9-4.8 MCG/ACT AERO Inhale 2 puffs into the lungs 2 (two) times daily.   busPIRone (BUSPAR) 5 MG tablet Take 5 mg by mouth 2 (two) times daily.   cyclobenzaprine (FLEXERIL) 10 MG tablet Take 10 mg by mouth 2 (two) times daily.   dicyclomine (BENTYL) 20 MG tablet Take 20 mg by mouth 4 (four) times daily.   famotidine (PEPCID) 20 MG tablet TAKE 1 TABLET DAILY AFTER SUPPER   fluticasone (FLONASE) 50 MCG/ACT nasal spray Place 2 sprays into both nostrils daily.   HYDROcodone-acetaminophen (NORCO) 10-325 MG tablet Take 1 tablet by mouth 3 (three) times daily as needed for pain.   LORazepam  (ATIVAN) 1 MG tablet Take 1 mg by mouth every evening.   megestrol (MEGACE) 20 MG tablet Take 20 mg by mouth daily.   metFORMIN (GLUCOPHAGE) 500 MG tablet Take 500 mg by mouth 2 (two) times daily with a meal.   naloxone (NARCAN) nasal spray 4 mg/0.1 mL Place 0.4 mg into the nose once.   nitroGLYCERIN (NITROSTAT) 0.4 MG SL tablet Place 0.4 mg under the tongue every 5 (five) minutes as needed for chest pain.   omeprazole (PRILOSEC) 40 MG capsule TAKE 1 CAPSULE 30 TO 60 MINUTES BEFORE FIRST MEAL OF THE DAY   ondansetron (ZOFRAN) 4 MG tablet Take 4 mg by mouth daily.   potassium chloride SA (KLOR-CON M) 20 MEQ tablet Take 1 tablet (20 mEq total) by mouth daily.   predniSONE (DELTASONE) 10 MG tablet Take  4 each am x 2 days,   2 each am x 2 days,  1 each am x 2 days and stop   rosuvastatin (CRESTOR) 20 MG tablet Take 20 mg by mouth at bedtime.   sertraline (ZOLOFT) 100 MG tablet Take 100  mg by mouth at bedtime.            Past Medical History:  Diagnosis Date   Anxiety    Bipolar affective disorder (HCC)    CAD (coronary artery disease)    Chronic respiratory failure (HCC)    COPD (chronic obstructive pulmonary disease) (HCC)    DM (diabetes mellitus) (HCC)    GERD (gastroesophageal reflux disease)    HTN (hypertension)    Polymyalgia rheumatica (HCC)           Objective:    Wt  05/30/2022      107  04/23/2022      104  03/25/2022        103  09/24/2021        107 03/15/2021         107  12/14/20 107 lb 12.8 oz (48.9 kg)  11/02/20 112 lb 12.8 oz (51.2 kg)  08/15/20 132 lb 4.4 oz (60 kg)    Vital signs reviewed  05/30/2022  - Note at rest 02 sats  95% on 2.5 lpm   General appearance:    amb bf using rolling walker   HEENT :  Oropharynx  clear     NECK :  without JVD/Nodes/TM/ nl carotid upstrokes bilaterally  LUNGS: no acc muscle use,  Mod barrel  contour chest wall with bilateral  Distant bs s audible wheeze and  without cough on insp or exp maneuvers and mod  Hyperresonant   to  percussion bilaterally     CV:  RRR  no s3 or murmur or increase in P2, and no edema   ABD:  soft and nontender with pos mid insp Hoover's  in the supine position. No bruits or organomegaly appreciated, bowel sounds nl  MS:   Ext warm without deformities or   obvious joint restrictions , calf tenderness, cyanosis or clubbing  SKIN: warm and dry without lesions    NEURO:  alert, approp, nl sensorium with  no motor or cerebellar deficits apparent.            I personally reviewed images and agree with radiology impression as follows:   Chest LDSCT  12/21/21  1. Lung-RADS 2, benign appearance or behavior. Continue annual screening with low-dose chest CT without contrast in 12 months. 2. Enlargement of the pulmonary outflow tract/main pulmonary arteries suggests pulmonary arterial hypertension. 3. Aortic Atherosclerosis (ICD10-I70.0) and Emphysema (ICD10-J43.9)                     Assessment

## 2022-05-31 ENCOUNTER — Encounter: Payer: Self-pay | Admitting: Internal Medicine

## 2022-05-31 NOTE — Assessment & Plan Note (Signed)
4-5 min discussion re active cigarette smoking in addition to office E&M  Ask about tobacco use:   ongoing Advise quitting   I took an extended  opportunity with this patient to outline the consequences of continued cigarette use  in airway disorders based on all the data we have from the multiple national lung health studies (perfomed over decades at millions of dollars in cost)  indicating that smoking cessation, not choice of inhalers or physicians, is the most important aspect of her care.  Also advised re dangers of 02 and combustible / ignited products near by  Assess willingness:  Not committed at this point Assist in quit attempt:  Per PCP when ready Arrange follow up:   Follow up per Primary Care planned

## 2022-05-31 NOTE — Assessment & Plan Note (Signed)
02 x around 2019 by Dr Juanetta Gosling -  11/02/2020   Walked RA  approx   450 ft  @ mod pace  stopped due to  [employer] /fatigue with sats still 94% - 03/15/2021   Walked on RA x  3  lap(s) =  approx 450  @ slow to mod pace, stopped due to end of study  with lowest 02 sats 94%   -  04/23/2022 Patient Saturations on Room Air at Rest = 88% Patient Saturations on 2.5LO2 of oxygen at rest= 93% Patient Saturations on 2.5 Liters of oxygen while Ambulating =91% lap one = 150 ft  2.5 Liters of oxygen while Ambulating= 90% lap two = 300 ft, slow rollator pace   - 05/30/2022   Walked on 2.5 lpm   x  2  lap(s) =  approx 399  ft  @ slow pace, stopped due to tired with lowest 02 sats 94% > refer for POC     Rec Make sure you check your oxygen saturation  AT  your highest level of activity (not after you stop)   to be sure it stays over 90% and adjust  02 flow upward to maintain this level if needed but remember to turn it back to previous settings when you stop (to conserve your supply).    Each maintenance medication was reviewed in detail including emphasizing most importantly the difference between maintenance and prns and under what circumstances the prns are to be triggered using an action plan format where appropriate.  Total time for H and P, chart review, counseling, reviewing hfa/02/neb device(s) , directly observing portions of ambulatory 02 saturation study/ and generating customized AVS unique to this office visit / same day charting  > 30 min

## 2022-05-31 NOTE — Assessment & Plan Note (Signed)
Active smoker -  PFT's  11/02/2020  FEV1 1.42 (85 % ) ratio 0.67  p 0 % improvement from saba p ? prior to study with DLCO  5.50 (31%) corrects to 1.55 (36%)  for alv volume and FV curve mild concavity   - 11/02/2020  try breztri 2bid and max gerd rx   - Allergy profile 03/15/21  >  Eos 0.1 /  IgE  72 - 04/23/2022  After extensive coaching inhaler device,  effectiveness =   80%  (short ti)     Group D (now reclassified as E) in terms of symptom/risk and laba/lama/ICS  therefore appropriate rx at this point >>>  breztri and approp saba  Re SABA :  I spent extra time with pt today reviewing appropriate use of albuterol for prn use on exertion with the following points: 1) saba is for relief of sob that does not improve by walking a slower pace or resting but rather if the pt does not improve after trying this first. 2) If the pt is convinced, as many are, that saba helps recover from activity faster then it's easy to tell if this is the case by re-challenging : ie stop, take the inhaler, then p 5 minutes try the exact same activity (intensity of workload) that just caused the symptoms and see if they are substantially diminished or not after saba 3) if there is an activity that reproducibly causes the symptoms, try the saba 15 min before the activity on alternate days   If in fact the saba really does help, then fine to continue to use it prn but advised may need to look closer at the maintenance regimen being used to achieve better control of airways disease with exertion.

## 2022-07-11 NOTE — Telephone Encounter (Signed)
Patient was re-qualified during November visit. Will close this encounter.

## 2022-08-28 ENCOUNTER — Telehealth: Payer: Self-pay | Admitting: Internal Medicine

## 2022-08-28 NOTE — Telephone Encounter (Signed)
Candace Harris from [facility]  calling to pt refill for albuterol inhaler, albuterol (PROVENTIL) nebulizer solution ,famotidine, losartan, lidocaine cream, cyclobenzaprine and amlodipine , hydrocodone, potassium and lorazepam. I asked if all of these medication prescribed by Dr. Melvyn Novas and he confirmed and said yes, Christinia Gully.  Pls advise  [phone]

## 2022-08-30 MED ORDER — ALBUTEROL SULFATE HFA 108 (90 BASE) MCG/ACT IN AERS: 2.0000 | INHALATION_SPRAY | RESPIRATORY_TRACT | 1 refills | Status: DC | PRN

## 2022-08-30 NOTE — Telephone Encounter (Signed)
Spoke with pharmacist  They were not able to tell that anyone had called about this pt  They then put me on a long hold   Called the pt  She states that the only rx she needs at this time from [place] is a refill on her albuterol inhaler, which I have sent to [facility] per her request Nothing further needed

## 2022-09-02 ENCOUNTER — Telehealth: Payer: Self-pay | Admitting: Internal Medicine

## 2022-09-02 MED ORDER — BUDESONIDE-FORMOTEROL FUMARATE 160-4.5 MCG/ACT IN AERO: 2.0000 | INHALATION_SPRAY | Freq: Two times a day (BID) | RESPIRATORY_TRACT | 6 refills | Status: DC

## 2022-09-02 NOTE — Telephone Encounter (Signed)
Called and spoke with pt in regards to the message sent by MW. Pt said that she felt like the Symbicort worked better for her than the Judithann Sauger so would like to go back on Symbicort. Rx sent to preferred pharmacy for pt. Nothing further needed.

## 2022-09-02 NOTE — Telephone Encounter (Signed)
She can have symbicort 160 or breztri  both are 2 bid but the sample I gave her was breztri as we don't sample symbicort and haven't in about 5 years

## 2022-09-02 NOTE — Telephone Encounter (Signed)
pt. calling to get a new perscrb.for symbicort sent into pharmacy Dr. Melvyn Novas had given samples last time she was seen

## 2022-12-01 NOTE — Progress Notes (Unsigned)
Candace Harris, female    DOB: May 16, 1951    MRN: [id number]   Brief patient profile:  53 yobf active smoker with GOLD I COPD by pfts 02/2019 transferred care to [facility] 11/02/2020 p admit (see below)   From Dr Chestine Spore 11/24/19 Elderly BF with a history of tobacco abuse and COPD who presents for evaluation of dyspnea on exertion.   She was diagnosed with COPD about 2 years ago but has had progressively worsening shortness of breath over the last 2 years.  She also has cough, sputum production, wheezing, but dyspnea on exertion is her main complaint.  Her activity is fairly limited; she is only able to walk a few steps around her house without stopping.   She is on 3 L supplemental oxygen at home.  She was prescribed 2.5 L but had ongoing dyspnea on titrated up to 3 L.  Her home saturations are usually in the 90s, but sometimes drop into the 80s.  She is currently using her Trelegy inhaler daily with frequent albuterol.  She continues to smoke 0.25- 0.5 packs/day; she has smoked 0.5 ppd for the last 50 years.     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  Rec: trelegy Check echo: 12/15/19  G I diastolic dysfunction / no cor pulmonale    Stopped trelegy  mid Jan 2022 due irritated mouth   Admit date: 08/15/2020 Discharge date: 08/18/2020 Brief Hospitalization Summary: Please see all hospital notes, images, labs for full details of the hospitalization. ADMISSION HPI: Candace Harris is a 72 y.o. female with medical history significant for HTN, CAD, anxiety, COPD (on 3-6 L of O2 at home), T2DM, GERD and bipolar disorder who presents to the emergency department due to 66-month onset of shortness of breath that has been progressively worsening, she complained of decreased oral intake due to loss of taste, she also endorsed loss of smell and she states that she had diarrhea for 2 to 3 weeks last month which has resolved.  She states that she saw her PCP in [place] who prescribed prednisone  for her, but she has not yet filled the prescription.  She decided to go to the ED for further evaluation due to persistent worsening shortness of breath and concern for dehydration.   ED Course: In the emergency department, she was tachycardic and intermittently tachypneic. Work-up in the ED showed leukocytosis, thrombocytosis, hypokalemia, hyponatremia, BUN/creatinine 41/1.70 (baseline creatinine was 0.7). D-dimer 0.78. Chest x-ray was reflective of emphysema but showed no active cardiopulmonary disease Breathing treatment was provided, Solu-Medrol 125 Mg x1 was given, IV Ativan Librium x1 due to anxiety was given and patient was provided with 500 mL of IV NS. Hospitalist was asked to admit patient for further evaluation and management.   Hospital Course    Acute on chronic respiratory failure with hypoxia - secondary to COPD exacerbation.  Pt much improved after IV steroids, bronchodilators and supportive measures. .  Prednisone taper.   Hypomagnesemia - IV replacement given and repleted.  Hypokalemia - repleted.  Essential hypertension - continue home meds GERD - protonix for GI protection.  Type 2 DM - monitored and stable.   AKI - resolved after hydration.    Discharge Diagnoses:  Principal Problem:   Shortness of breath Active Problems:   AKI (acute kidney injury) (HCC)   Dehydration   Leukocytosis   Thrombocytosis   Essential hypertension   Elevated d-dimer   Hypokalemia   Anxiety   COPD  with acute exacerbation (HCC)   Hyperlipidemia   Diabetes mellitus (HCC)   GERD (gastroesophageal reflux disease)   CAD (coronary artery disease)        History of Present Illness  11/02/2020  Pulmonary/ 1st Harris eval/ Candace Harris / Candace [facility] Harris GOLD I copd with component of UACS/ anxiety  Chief Complaint  Patient presents with   Follow-up    Productive cough with white phlegm since January 2022  Dyspnea:  Can do 17 steps at appt complex but has to stop at top to recover / last  shopping x 2 y prior to OV   Cough: hocking min white daytime off gerd rx  Sleep: on side flat bed s resp symptoms "as long as I have on my oxygen"  SABA use: avg 6-8 x per day / no maint rx  02 2lpm 24/7 rec Plan A = Automatic = Always=    Breztri Take 2 puffs first thing in am and then another 2 puffs about 12 hours later.  Work on inhaler technique: Plan B = Backup (to supplement plan A, not to replace it) Only use your albuterol inhaler as a rescue medication Try prilosec 40mg   Take 30-60 min before first meal of the day and Pepcid ac (famotidine) 20 mg one after supper  Until return  GERD diet  Please schedule a follow up Harris visit in 6 weeks, call sooner if needed with all medications /inhalers/ solutions in hand so we can verify exactly what you are taking. This includes all medications from all doctors and over the counters        03/15/2021  f/u ov/[place] Harris/Candace Harris re: GOLD I/  uacs/ maint on breztri   Chief Complaint  Patient presents with   Follow-up    Patient uses 2.5 L O2 all of time. She says that when she is sitting she doesn't feel that she needs it but when she is up moving or doing anything she needs the 2.5 L O2. She does sleep with the oxygen on because she wakes up in the morning feeling SOB when shes not using it overnight. She would like to be weaned off the oxygen. Uses nebulizer once in the morning and once at night.   Dyspnea:  only goes out to doctor's Harris/ room to room at home  Cough: sensation of globus / almost threw up despite max gerd rx  Sleeping: flat bed /on side/ 2 pillows no resp symptoms  SABA use: only uses p ex but neb twice weeks esp in am  02: using 02 p ex and hs  Covid status: no vax/ no infection  Rec Plan A = Automatic = Always=    breztri Take 2 puffs first thing in am and then another 2 puffs about 12 hours later.  Work on inhaler technique:   Plan B = Backup (to supplement plan A, not to replace it) Only use your albuterol  inhaler as a rescue medication  Plan C = Crisis (instead of Plan B but only if Plan B stops working) - only use your albuterol nebulizer if you first try Plan B  Make sure you check your oxygen saturation  at your highest level of activity  to be sure it stays over 90%          03/25/2022  f/u ov/ Harris/Aerielle Stoklosa re: GOLD 1 with emphysema on CT and ? Cor pulmonale with nl echo 12/15/19   maint on breztri / still smoking  Chief Complaint  Patient presents  with   Follow-up    Is now using 3LO2 during the day prn.  Feels breathing has worsened since last ov.  Wakes up during night feeling SOB   Dyspnea:  mostly housebound  Cough: smoker's rattle / min am mucoid sputum Sleeping: on side 4 pillows  SABA use: way  too much  02: 3lpm hs and prn daytime  Covid status: never vax/ infected  Rec We will ask adapt to do best fit evaluation for portable oxygen  Plan A = Automatic = Always=    Breztri Take 2 puffs first thing in am and then another 2 puffs about 12 hours later.  Work on inhaler technique: Plan B = Backup (to supplement plan A, not to replace it) Only use your albuterol inhaler as a rescue medication Plan C = Crisis (instead of Plan B but only if Plan B stops working) - only use your albuterol nebulizer if you first try Plan B  Zpak Prednisone 10 mg take  4 each am x 2 days,   2 each am x 2 days,  1 each am x 2 days and stop The key is to stop smoking completely before smoking completely stops you!. Please schedule a follow up Harris visit in 4 weeks, sooner if needed - bring your inhalers with you    04/23/2022  f/u ov/Candace [facility]/Candace Harris re: GOLD 1 maint on breztri but hasn't taken it on day of ov Some better on prednisone then worse off it  / did not bring inhalers as requested and confused with details of care  Chief Complaint  Patient presents with   Follow-up    Breathing is doing well.  Has some abd days with breathing on occasion   Dyspnea:  wants to get out  for 30 min of walking per day - has rollator and having trouble lifting 02 and putting on rollator  Cough: smoker's rattle  Sleeping: flat bed /on side/ 4pillows  SABA use: not using on good days,  2-4 x per day day / not using neb  02: 3lpm hs /  2.5 pulsed prn daytime  Covid status: never vax, never infected  Lung cancer screening: already in program  Rec No change in medications Work on inhaler technique:   We will refer you again for best fit for portable 02 ? POC if possible  Make sure you check your oxygen saturation  AT  your highest level of activity (not after you stop)   to be sure it stays over 90% Ok to try albuterol 15 min before an activity (on alternating days)  that you know would usually make you short of breath     05/30/2022  f/u ov/[place] [facility]/Candace Harris re: GOLD 1  maint on breztri 2bi  Chief Complaint  Patient presents with   Follow-up    Breathing doing okay since last ov    Dyspnea:  confined to house mostly, doe room to room  Cough: none  Sleeping: flat bed/ on side/ lots of pillows  x years SABA use: up to 3 x daily  02: 2.5 lpm 24/7  Covid status: never vax/ never infected  Lung cancer screening: in program  Rec No change in medications The key is to stop smoking completely before smoking completely stops you! We will refer you again for best fit for portable 02 ? POC if possible  Make sure you check your oxygen saturation  AT  your highest level of activity (not after you stop)   to  be sure it stays over 90%  Ok to try albuterol 15 min before an activity (on alternating days with inhaler vs nebulizer)   that you know would usually make you short of breath          12/02/2022  6 m f/u ov/[place] Harris/Candace Harris re: *** maint on ***  No chief complaint on file.   Dyspnea:  *** Cough: *** Sleeping: *** SABA use: *** 02: *** Covid status: *** Lung cancer screening: ***   No obvious day to day or daytime variability or assoc excess/ purulent  sputum or mucus plugs or hemoptysis or cp or chest tightness, subjective wheeze or overt sinus or hb symptoms.   *** without nocturnal  or early am exacerbation  of respiratory  c/o's or need for noct saba. Also denies any obvious fluctuation of symptoms with weather or environmental changes or other aggravating or alleviating factors except as outlined above   No unusual exposure hx or h/o childhood pna/ asthma or knowledge of premature birth.  Current Allergies, Complete Past Medical History, Past Surgical History, Family History, and Social History were reviewed in [employer] record.  ROS  The following are not active complaints unless bolded Hoarseness, sore throat, dysphagia, dental problems, itching, sneezing,  nasal congestion or discharge of excess mucus or purulent secretions, ear ache,   fever, chills, sweats, unintended wt loss or wt gain, classically pleuritic or exertional cp,  orthopnea pnd or arm/hand swelling  or leg swelling, presyncope, palpitations, abdominal pain, anorexia, nausea, vomiting, diarrhea  or change in bowel habits or change in bladder habits, change in stools or change in urine, dysuria, hematuria,  rash, arthralgias, visual complaints, headache, numbness, weakness or ataxia or problems with walking or coordination,  change in mood or  memory.        No outpatient medications have been marked as taking for the 12/02/22 encounter (Appointment) with Nyoka Cowden, MD.             Past Medical History:  Diagnosis Date   Anxiety    Bipolar affective disorder [facility])    CAD (coronary artery disease)    Chronic respiratory failure (HCC)    COPD (chronic obstructive pulmonary disease) (HCC)    DM (diabetes mellitus) (HCC)    GERD (gastroesophageal reflux disease)    HTN (hypertension)    Polymyalgia rheumatica (HCC)           Objective:    Wt  12/02/2022         ***  05/30/2022      107  04/23/2022      104  03/25/2022        103   09/24/2021        107 03/15/2021         107  12/14/20 107 lb 12.8 oz (48.9 kg)  11/02/20 112 lb 12.8 oz (51.2 kg)  08/15/20 132 lb 4.4 oz (60 kg)    Vital signs reviewed  12/02/2022  - Note at rest 02 sats  ***% on ***   General appearance:    ***     Mod barr***                        Assessment

## 2022-12-02 ENCOUNTER — Ambulatory Visit (INDEPENDENT_AMBULATORY_CARE_PROVIDER_SITE_OTHER): Payer: Medicare HMO | Admitting: Internal Medicine

## 2022-12-02 ENCOUNTER — Ambulatory Visit (HOSPITAL_COMMUNITY)
Admission: RE | Admit: 2022-12-02 | Discharge: 2022-12-02 | Disposition: A | Discharge status: Home / Self Care | Payer: Medicare HMO | Source: Ambulatory Visit | Attending: Internal Medicine | Admitting: Internal Medicine

## 2022-12-02 ENCOUNTER — Encounter: Payer: Self-pay | Admitting: Internal Medicine

## 2022-12-02 VITALS — BP 124/76 | HR 100 | Ht 61.5 in | Wt 110.0 lb

## 2022-12-02 DIAGNOSIS — F17210 Nicotine dependence, cigarettes, uncomplicated: Secondary | ICD-10-CM

## 2022-12-02 DIAGNOSIS — J9611 Chronic respiratory failure with hypoxia: Secondary | ICD-10-CM

## 2022-12-02 DIAGNOSIS — J449 Chronic obstructive pulmonary disease, unspecified: Secondary | ICD-10-CM

## 2022-12-02 DIAGNOSIS — R0609 Other forms of dyspnea: Secondary | ICD-10-CM | POA: Diagnosis present

## 2022-12-02 MED ORDER — SPIRIVA RESPIMAT 2.5 MCG/ACT IN AERS
2.0000 | INHALATION_SPRAY | Freq: Every day | RESPIRATORY_TRACT | 11 refills | Status: DC
Start: 2022-12-02 — End: 2022-12-31

## 2022-12-02 MED ORDER — PREDNISONE 10 MG PO TABS: 10.0000 mg | ORAL_TABLET | Freq: Every day | ORAL | 0 refills | Status: DC

## 2022-12-02 MED ORDER — SPIRIVA RESPIMAT 2.5 MCG/ACT IN AERS: 2.0000 | INHALATION_SPRAY | Freq: Every day | RESPIRATORY_TRACT | 0 refills | Status: DC

## 2022-12-02 NOTE — Assessment & Plan Note (Addendum)
02 x around 2019 by Dr Juanetta Gosling -  11/02/2020   Walked RA  approx   450 ft  @ mod pace  stopped due to  [employer] /fatigue with sats still 94% - 03/15/2021   Walked on RA x  3  lap(s) =  approx 450  @ slow to mod pace, stopped due to end of study  with lowest 02 sats 94%   -  04/23/2022 Patient Saturations on Room Air at Rest = 88% Patient Saturations on 2.5LO2 of oxygen at rest= 93% Patient Saturations on 2.5 Liters of oxygen while Ambulating =91% lap one = 150 ft  2.5 Liters of oxygen while Ambulating= 90% lap two = 300 ft, slow rollator pace   - 05/30/2022   Walked on 2.5 lpm   x  2  lap(s) =  approx 399  ft  @ slow pace, stopped due to tired with lowest 02 sats 94% > refer for POC     Well compensated on 3lpm (except when smoking)  Advised:' 3lpm hs and Make sure you check your oxygen saturation  AT  your highest level of activity (not after you stop)   to be sure it stays over 90% and adjust  02 flow upward to maintain this level if needed but remember to turn it back to previous settings when you stop (to conserve your supply).          Each maintenance medication was reviewed in detail including emphasizing most importantly the difference between maintenance and prns and under what circumstances the prns are to be triggered using an action plan format where appropriate.  Total time for H and P, chart review, counseling, reviewing smi/hfa/ neb /02 device(s) and generating customized AVS unique to this office visit / same day charting  > 40 min for   refractory respiratory  symptoms of uncertain etiology

## 2022-12-02 NOTE — Patient Instructions (Addendum)
Plan A = Automatic = Always=    symbicort 160 and spiriva 2 puffs of each 1st thing in am and then another 2 puffs 12 hours later  Work on inhaler technique:  relax and gently blow all the way out then take a nice smooth full deep breath back in, triggering the inhaler at same time you start breathing in.  Hold breath in for at least  5 seconds if you can.  Rinse and gargle with water when done.  If mouth or throat bother you at all,  try brushing teeth/gums/tongue with arm and hammer toothpaste/ make a slurry and gargle and spit out.  - take practice breaths of spiriva before you wind it up       Plan B = Backup (to supplement plan A, not to replace it) Only use your albuterol inhaler as a rescue medication to be used if you can't catch your breath by resting or doing a relaxed purse lip breathing pattern.  - The less you use it, the better it will work when you need it. - Ok to use the inhaler up to 2 puffs  every 4 hours if you must but call for appointment if use goes up over your usual need - Don't leave home without it !!  (think of it like the spare tire for your car)   Plan C = Crisis (instead of Plan B but only if Plan B stops working) - only use your albuterol nebulizer if you first try Plan B and it fails to help > ok to use the nebulizer up to every 4 hours but if start needing it regularly call for immediate appointment  Please remember to go to the lab department   for your tests - we will call you with the results when they are available.     Please remember to go to the  x-ray department  @  [facility] for your tests - we will call you with the results when they are available    Please schedule a follow up office visit in 6 weeks, call sooner if needed with all medications /inhalers/ solutions in hand so we can verify exactly what you are taking. This includes all medications from all doctors and over the counters

## 2022-12-02 NOTE — Assessment & Plan Note (Signed)
Active smoker -  PFT's  11/02/2020  FEV1 1.42 (85 % ) ratio 0.67  p 0 % improvement from saba p ? 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  - 12/02/2022  After extensive coaching inhaler device,  effectiveness =    75% with smi/ respimat >  added spiriva 2.5 x 2 puffs each am   Group D (now reclassified as E) in terms of symptom/risk and laba/lama/ICS  therefore appropriate rx at this point >>>  add spiriva to symbiocrt 160 and use saba approp: Re SABA :  I spent extra time with pt today reviewing appropriate use of albuterol for prn use on exertion with the following points: 1) saba is for relief of sob that does not improve by walking a slower pace or resting but rather if the pt does not improve after trying this first. 2) If the pt is convinced, as many are, that saba helps recover from activity faster then it's easy to tell if this is the case by re-challenging : ie stop, take the inhaler, then p 5 minutes try the exact same activity (intensity of workload) that just caused the symptoms and see if they are substantially diminished or not after saba 3) if there is an activity that reproducibly causes the symptoms, try the saba 15 min before the activity on alternate days   If in fact the saba really does help, then fine to continue to use it prn but advised may need to look closer at the maintenance regimen being used to achieve better control of airways disease with exertion.   Looks extremely frail today disproportionate to FEV1 / GOLD 1 copd (see doe)

## 2022-12-02 NOTE — Assessment & Plan Note (Signed)
4-5 min discussion re active cigarette smoking in addition to office E&M  Ask about tobacco use:   onoing  Advise quitting   issue of life and breath discussed  Assess willingness:  Not committed at this point Assist in quit attempt:  Per PCP when ready Arrange follow up:   Follow up per Primary Care planned

## 2022-12-03 LAB — CBC WITH DIFFERENTIAL/PLATELET
Basophils Absolute: 0 10*3/uL (ref 0.0–0.2)
Basos: 0 %
EOS (ABSOLUTE): 0 10*3/uL (ref 0.0–0.4)
Eos: 1 %
Hematocrit: 27.8 % — ABNORMAL LOW (ref 34.0–46.6)
Hemoglobin: 7.7 g/dL — ABNORMAL LOW (ref 11.1–15.9)
Immature Grans (Abs): 0.1 10*3/uL (ref 0.0–0.1)
Immature Granulocytes: 1 %
Lymphocytes Absolute: 1.9 10*3/uL (ref 0.7–3.1)
Lymphs: 26 %
MCH: 19.4 pg — ABNORMAL LOW (ref 26.6–33.0)
MCHC: 27.7 g/dL — ABNORMAL LOW (ref 31.5–35.7)
MCV: 70 fL — ABNORMAL LOW (ref 79–97)
Monocytes Absolute: 0.9 10*3/uL (ref 0.1–0.9)
Monocytes: 12 %
Neutrophils Absolute: 4.5 10*3/uL (ref 1.4–7.0)
Neutrophils: 60 %
Platelets: 364 10*3/uL (ref 150–450)
RBC: 3.96 x10E6/uL (ref 3.77–5.28)
RDW: 19.6 % — ABNORMAL HIGH (ref 11.7–15.4)
WBC: 7.4 10*3/uL (ref 3.4–10.8)

## 2022-12-03 LAB — BASIC METABOLIC PANEL
BUN/Creatinine Ratio: 14 (ref 12–28)
BUN: 9 mg/dL (ref 8–27)
CO2: 23 mmol/L (ref 20–29)
Calcium: 9.5 mg/dL (ref 8.7–10.3)
Chloride: 101 mmol/L (ref 96–106)
Creatinine, Ser: 0.63 mg/dL (ref 0.57–1.00)
Glucose: 69 mg/dL — ABNORMAL LOW (ref 70–99)
Potassium: 3.5 mmol/L (ref 3.5–5.2)
Sodium: 142 mmol/L (ref 134–144)
eGFR: 95 mL/min/{1.73_m2} (ref >59)

## 2022-12-03 LAB — D-DIMER, QUANTITATIVE: D-DIMER: 0.88 mg/L FEU — ABNORMAL HIGH (ref 0.00–0.49)

## 2022-12-03 LAB — TSH: TSH: 1.3 u[IU]/mL (ref 0.450–4.500)

## 2022-12-03 NOTE — Assessment & Plan Note (Signed)
Main finding on labs is progressive microcytic anemia that likely explains her DOE and fatigue and FTT - will likely need GI w/u / already on PPI  The other labs are fine   D dimer  is high normal value (seen commonly in the elderly or chronically ill)  may miss small peripheral pe, the clot burden with sob is moderately high and the d dimer  has a very high neg pred value if used in this setting and would not pursue w/u here.

## 2022-12-03 NOTE — Progress Notes (Signed)
Spoke with the pt and notified of response per Dr Sherene Sires. She verbalized understanding. I have faxed to PCP and GI.

## 2022-12-08 NOTE — Progress Notes (Deleted)
GI Office Note    Referring Provider: Rebekah Chesterfield, NP Primary Care Physician:  Rebekah Chesterfield, NP Primary Gastroenterologist: Gerrit Friends.Rourk, MD  Date:  12/08/2022  ID:  Candace Harris, DOB June 19, 1951, MRN [id number]   Chief Complaint   No chief complaint on file.  History of Present Illness  Candace Harris is a 72 y.o. female with a history of COPD on home oxygen, GERD, DM, HTN, Stroke, anxiety/depression, CAD, and polymyalgia rheumatica presenting today for further evaluation of worsening anemia.  Possible colonoscopy in 2015 at [facility].  No procedure report available.   Last office visit 02/12/22.  Unable to eat meat, collate soft foods.  Rice and bread sticks to her throat.  Has intermittent odynophagia.  Constant epigastric pain.  Has had abdominal soreness for 15 years.  Reported some weight loss.  NSAIDs only as needed.  Omeprazole once daily and Pepcid nightly controls GERD symptoms.  Reports constipation then have diarrhea with vomiting for about 6 months.  Taking dicyclomine 20 mg 4 times daily for many years prescribed for her epigastric pain.  On 2 L nasal cannula continuously.  Advised dicyclomine only as needed.  Continue PPI once daily.  Scheduled for EGD with dilation.   EGD 03/13/22: -Slightly ringed esophagus s/p dilation and biopsy -Normal stomach -Normal duodenum -Esophageal biopsies unremarkable, negative for EOE  Labs 11/07/22: Hgb 8.5, MCV 72. CMP unremarkable.  Labs 12/02/22: Hgb 7.7, MCV 70. TSH normal. BMP unremarkable.       Latest Ref Rng & Units 12/02/2022   11:40 AM 03/08/2022    2:25 PM 03/15/2021   11:17 AM  CBC  WBC 3.4 - 10.8 x10E3/uL 7.4  10.8  19.8   Hemoglobin 11.1 - 15.9 g/dL 7.7  9.6  02.5   Hematocrit 34.0 - 46.6 % 27.8  32.1  37.4   Platelets 150 - 450 x10E3/uL 364  313  366    Today: Anemia -   GERD -    Current Outpatient Medications  Medication Sig Dispense Refill   acetaminophen (TYLENOL) 650 MG CR tablet Take 650 mg by mouth  every 8 (eight) hours as needed for pain.     albuterol (PROVENTIL) (2.5 MG/3ML) 0.083% nebulizer solution Take 3 mLs (2.5 mg total) by nebulization every 4 (four) hours as needed for wheezing or shortness of breath. 1080 mL 3   albuterol (VENTOLIN HFA) 108 (90 Base) MCG/ACT inhaler Inhale 2 puffs into the lungs every 4 (four) hours as needed for wheezing or shortness of breath. for wheezing 18 g 1   amLODipine (NORVASC) 5 MG tablet Take 5 mg by mouth daily.     ARIPiprazole (ABILIFY) 5 MG tablet Take 5 mg by mouth daily.     budesonide-formoterol (SYMBICORT) 160-4.5 MCG/ACT inhaler Inhale 2 puffs into the lungs in the morning and at bedtime. 10.2 g 6   busPIRone (BUSPAR) 5 MG tablet Take 5 mg by mouth 2 (two) times daily.     cyclobenzaprine (FLEXERIL) 10 MG tablet Take 10 mg by mouth 2 (two) times daily.     dicyclomine (BENTYL) 20 MG tablet Take 20 mg by mouth 4 (four) times daily.     famotidine (PEPCID) 20 MG tablet TAKE 1 TABLET DAILY AFTER SUPPER 90 tablet 3   fluticasone (FLONASE) 50 MCG/ACT nasal spray Place 2 sprays into both nostrils daily.     HYDROcodone-acetaminophen (NORCO) 10-325 MG tablet Take 1 tablet by mouth 3 (three) times daily as needed for pain.  megestrol (MEGACE) 20 MG tablet Take 20 mg by mouth daily.     metFORMIN (GLUCOPHAGE) 500 MG tablet Take 500 mg by mouth 2 (two) times daily with a meal.     naloxone (NARCAN) nasal spray 4 mg/0.1 mL Place 0.4 mg into the nose once.     nitroGLYCERIN (NITROSTAT) 0.4 MG SL tablet Place 0.4 mg under the tongue every 5 (five) minutes as needed for chest pain.     omeprazole (PRILOSEC) 40 MG capsule TAKE 1 CAPSULE 30 TO 60 MINUTES BEFORE FIRST MEAL OF THE DAY 90 capsule 3   ondansetron (ZOFRAN) 4 MG tablet Take 4 mg by mouth daily.     potassium chloride SA (KLOR-CON M) 20 MEQ tablet Take 1 tablet (20 mEq total) by mouth daily. 5 tablet 0   predniSONE (DELTASONE) 10 MG tablet Take 1 tablet (10 mg total) by mouth daily with  breakfast. 14 tablet 0   rosuvastatin (CRESTOR) 20 MG tablet Take 20 mg by mouth at bedtime.     Tiotropium Bromide Monohydrate (SPIRIVA RESPIMAT) 2.5 MCG/ACT AERS Inhale 2 puffs into the lungs daily. 4 g 11   Tiotropium Bromide Monohydrate (SPIRIVA RESPIMAT) 2.5 MCG/ACT AERS Inhale 2 puffs into the lungs daily. 4 g 0   No current facility-administered medications for this visit.    Past Medical History:  Diagnosis Date   Anxiety    Arthritis    Bipolar affective disorder (HCC)    CAD (coronary artery disease)    Chronic respiratory failure (HCC)    Complication of anesthesia    COPD (chronic obstructive pulmonary disease) (HCC)    Depression    DM (diabetes mellitus) (HCC)    GERD (gastroesophageal reflux disease)    HTN (hypertension)    Polymyalgia rheumatica (HCC)    PONV (postoperative nausea and vomiting)    Stroke [facility])     Past Surgical History:  Procedure Laterality Date   ABDOMINAL HYSTERECTOMY N/A    ANTERIOR FUSION CERVICAL SPINE  2012   C5-7   BACK SURGERY     BIOPSY  03/13/2022   Procedure: BIOPSY;  Surgeon: Corbin Ade, MD;  [facility];  Service: Endoscopy;;   ESOPHAGOGASTRODUODENOSCOPY (EGD) WITH PROPOFOL N/A 03/13/2022   Procedure: ESOPHAGOGASTRODUODENOSCOPY (EGD) WITH PROPOFOL;  Surgeon: Corbin Ade, MD;  [facility];  Service: Endoscopy;  Laterality: N/A;  12:30pm   MALONEY DILATION N/A 03/13/2022   Procedure: MALONEY DILATION;  Surgeon: Corbin Ade, MD;  [facility];  Service: Endoscopy;  Laterality: N/A;   SHOULDER SURGERY      Family History  Problem Relation Age of Onset   Hypertension Mother    Breast cancer Sister    Colon cancer Neg Hx     Allergies as of 12/10/2022 - Review Complete 12/02/2022  Allergen Reaction Noted   Alendronate Other (See Comments) 02/08/2014   Ibuprofen Nausea And Vomiting 11/04/2013   Ramipril Nausea And Vomiting and Other (See Comments) 11/04/2013   Sulfabenzamide  Other (See Comments) 11/04/2013    Social History   Socioeconomic History   Marital status: Single    Spouse name: Not on file   Number of children: Not on file   Years of education: Not on file   Highest education level: Not on file  Occupational History   Not on file  Tobacco Use   Smoking status: Every Day    Packs/day: 0.50    Years: 50.00    Additional pack years: 0.00  Total pack years: 25.00    Types: Cigarettes   Smokeless tobacco: Never  Vaping Use   Vaping Use: Never used  Substance and Sexual Activity   Alcohol use: Never   Drug use: Never   Sexual activity: Not on file  Other Topics Concern   Not on file  Social History Narrative   Not on file   Social Determinants of Health   Financial Resource Strain: Not on file  Food Insecurity: Not on file  Transportation Needs: Not on file  Physical Activity: Not on file  Stress: Not on file  Social Connections: Not on file     Review of Systems   Gen: Denies fever, chills, anorexia. Denies fatigue, weakness, weight loss.  CV: Denies chest pain, palpitations, syncope, peripheral edema, and claudication. Resp: Denies dyspnea at rest, cough, wheezing, coughing up blood, and pleurisy. GI: See HPI Derm: Denies rash, itching, dry skin Psych: Denies depression, anxiety, memory loss, confusion. No homicidal or suicidal ideation.  Heme: Denies bruising, bleeding, and enlarged lymph nodes.   Physical Exam   There were no vitals taken for this visit.  General:   Alert and oriented. No distress noted. Pleasant and cooperative.  Head:  Normocephalic and atraumatic. Eyes:  Conjuctiva clear without scleral icterus. Mouth:  Oral mucosa pink and moist. Good dentition. No lesions. Lungs:  Clear to auscultation bilaterally. No wheezes, rales, or rhonchi. No distress.  Heart:  S1, S2 present without murmurs appreciated.  Abdomen:  +BS, soft, non-tender and non-distended. No rebound or guarding. No HSM or masses  noted. Rectal: *** Msk:  Symmetrical without gross deformities. Normal posture. Extremities:  Without edema. Neurologic:  Alert and  oriented x4 Psych:  Alert and cooperative. Normal mood and affect.   Assessment  Candace Harris is a 72 y.o. female with a history of COPD on home oxygen, GERD, DM, HTN, Stroke, anxiety/depression, CAD, and polymyalgia rheumatica presenting toady at the request of pulmonology given worsening anemia.   GERD, Dysphagia:   Microcytic anemia:   PLAN   *** EGD with Dr. Jena Gauss or Marletta Lor? ASA 3 PPI BID? Request colonoscopy records from [facility] CBC every 2 weeks until EGD?    Brooke Bonito, MSN, FNP-BC, AGACNP-BC [facility]

## 2022-12-10 ENCOUNTER — Encounter: Payer: Self-pay | Admitting: Gastroenterology

## 2022-12-10 ENCOUNTER — Encounter: Payer: Self-pay | Admitting: Orthopedic Surgery

## 2022-12-10 ENCOUNTER — Ambulatory Visit (INDEPENDENT_AMBULATORY_CARE_PROVIDER_SITE_OTHER): Payer: Medicare HMO | Admitting: Gastroenterology

## 2022-12-10 ENCOUNTER — Ambulatory Visit (INDEPENDENT_AMBULATORY_CARE_PROVIDER_SITE_OTHER): Payer: Medicare HMO

## 2022-12-10 ENCOUNTER — Ambulatory Visit (INDEPENDENT_AMBULATORY_CARE_PROVIDER_SITE_OTHER): Payer: Medicare HMO | Admitting: Orthopedic Surgery

## 2022-12-10 ENCOUNTER — Telehealth: Payer: Self-pay | Admitting: *Deleted

## 2022-12-10 ENCOUNTER — Ambulatory Visit: Payer: Medicare HMO | Admitting: Gastroenterology

## 2022-12-10 VITALS — BP 138/77 | HR 108 | Temp 98.4°F | Ht 61.0 in | Wt 112.8 lb

## 2022-12-10 VITALS — BP 157/93 | HR 97 | Ht 61.5 in | Wt 110.0 lb

## 2022-12-10 DIAGNOSIS — M4322 Fusion of spine, cervical region: Secondary | ICD-10-CM

## 2022-12-10 DIAGNOSIS — M79602 Pain in left arm: Secondary | ICD-10-CM

## 2022-12-10 DIAGNOSIS — R1013 Epigastric pain: Secondary | ICD-10-CM | POA: Diagnosis not present

## 2022-12-10 DIAGNOSIS — K219 Gastro-esophageal reflux disease without esophagitis: Secondary | ICD-10-CM | POA: Diagnosis not present

## 2022-12-10 DIAGNOSIS — M25532 Pain in left wrist: Secondary | ICD-10-CM | POA: Diagnosis not present

## 2022-12-10 DIAGNOSIS — D509 Iron deficiency anemia, unspecified: Secondary | ICD-10-CM

## 2022-12-10 DIAGNOSIS — G8929 Other chronic pain: Secondary | ICD-10-CM

## 2022-12-10 DIAGNOSIS — R112 Nausea with vomiting, unspecified: Secondary | ICD-10-CM | POA: Diagnosis not present

## 2022-12-10 NOTE — Progress Notes (Signed)
New Patient Visit  Assessment: Candace Harris is a 72 y.o. female with the following: 1. Left arm pain 2. Pain in left wrist  Plan: Candace Harris has pain that starts in the left side of her neck, radiates through her shoulder to the medial aspect of the forearm, and into the ulnar side of her hand.  She has a history of cervical spine surgery, over 10 years ago.  Radiographs today demonstrates degenerative changes above this construct.  She is adamant that she is not interested in any surgery.  After discussing her pain, she also states that she has pain in the left shoulder, as well as the left wrist.  She was given some gabapentin recently, and I recommended that she consider taking this to help with the radiating pain from her neck.  She is also interested in an injection in the left wrist.  This was completed in clinic today.  She will return to clinic as needed.  Procedure note injection Left wrist   Verbal consent was obtained to inject the left wrist Timeout was completed to confirm the site of injection.  The skin was prepped with alcohol and ethyl chloride was sprayed at the injection site.  A 21-gauge needle was used to inject 40 mg of Depo-Medrol and 1% lidocaine (1 cc) into the left wrist joint using a dorsal approach.  There were no complications. A sterile bandage was applied.   Follow-up: Return if symptoms worsen or fail to improve.  Subjective:  Chief Complaint  Patient presents with   Left arm pain    History of Present Illness: Candace Harris is a 72 y.o. female who has been referred by Gilman Schmidt, NP for evaluation of left arm pain.  For the past 6 months, she states that she has had pain that starts in the left side of her neck, radiates into the shoulder blade, through the shoulder down the arm to the ulnar side of her hand.  Over the past couple months, this has gotten worse.  No specific injury.  She does have a history of cervical fusion, completed over 10 years  ago.  She notes radiating pains in this distribution, consistent with nerve irritation.  She was given some gabapentin a couple days ago, but has not started taking this.  She is on chronic narcotic therapy.  She also has pain in the left wrist, which is exacerbated by the use of her walker.   Review of Systems: No fevers or chills + numbness or tingling No chest pain No shortness of breath No bowel or bladder dysfunction No GI distress No headaches   Medical History:  Past Medical History:  Diagnosis Date   Anxiety    Arthritis    Bipolar affective disorder (HCC)    CAD (coronary artery disease)    Chronic respiratory failure (HCC)    Complication of anesthesia    COPD (chronic obstructive pulmonary disease) (HCC)    Depression    DM (diabetes mellitus) (HCC)    GERD (gastroesophageal reflux disease)    HTN (hypertension)    Polymyalgia rheumatica (HCC)    PONV (postoperative nausea and vomiting)    Stroke [facility])     Past Surgical History:  Procedure Laterality Date   ABDOMINAL HYSTERECTOMY N/A    ANTERIOR FUSION CERVICAL SPINE  2012   C5-7   BACK SURGERY     BIOPSY  03/13/2022   Procedure: BIOPSY;  Surgeon: Corbin Ade, MD;  [facility];  Service:  Endoscopy;;   ESOPHAGOGASTRODUODENOSCOPY (EGD) WITH PROPOFOL N/A 03/13/2022   Procedure: ESOPHAGOGASTRODUODENOSCOPY (EGD) WITH PROPOFOL;  Surgeon: Corbin Ade, MD;  [facility];  Service: Endoscopy;  Laterality: N/A;  12:30pm   MALONEY DILATION N/A 03/13/2022   Procedure: MALONEY DILATION;  Surgeon: Corbin Ade, MD;  [facility];  Service: Endoscopy;  Laterality: N/A;   SHOULDER SURGERY      Family History  Problem Relation Age of Onset   Hypertension Mother    Breast cancer Sister    Colon cancer Neg Hx    Social History   Tobacco Use   Smoking status: Every Day    Packs/day: 0.50    Years: 50.00    Additional pack years: 0.00    Total pack years: 25.00    Types:  Cigarettes   Smokeless tobacco: Never  Vaping Use   Vaping Use: Never used  Substance Use Topics   Alcohol use: Never   Drug use: Never    Allergies  Allergen Reactions   Alendronate Other (See Comments)    Sore muscles, burning in chest Sore muscles, burning in chest    Ibuprofen Nausea And Vomiting    Other reaction(s): Unknown    Ramipril Nausea And Vomiting and Other (See Comments)   Sulfabenzamide Other (See Comments)    HEADACHE     Current Meds  Medication Sig   acetaminophen (TYLENOL) 650 MG CR tablet Take 650 mg by mouth every 8 (eight) hours as needed for pain.   albuterol (PROVENTIL) (2.5 MG/3ML) 0.083% nebulizer solution Take 3 mLs (2.5 mg total) by nebulization every 4 (four) hours as needed for wheezing or shortness of breath.   albuterol (VENTOLIN HFA) 108 (90 Base) MCG/ACT inhaler Inhale 2 puffs into the lungs every 4 (four) hours as needed for wheezing or shortness of breath. for wheezing   amLODipine (NORVASC) 5 MG tablet Take 5 mg by mouth daily.   ARIPiprazole (ABILIFY) 5 MG tablet Take 5 mg by mouth daily.   budesonide-formoterol (SYMBICORT) 160-4.5 MCG/ACT inhaler Inhale 2 puffs into the lungs in the morning and at bedtime.   busPIRone (BUSPAR) 5 MG tablet Take 5 mg by mouth 2 (two) times daily.   cyclobenzaprine (FLEXERIL) 10 MG tablet Take 10 mg by mouth 2 (two) times daily.   dicyclomine (BENTYL) 20 MG tablet Take 20 mg by mouth 4 (four) times daily.   famotidine (PEPCID) 20 MG tablet TAKE 1 TABLET DAILY AFTER SUPPER   fluticasone (FLONASE) 50 MCG/ACT nasal spray Place 2 sprays into both nostrils daily.   HYDROcodone-acetaminophen (NORCO) 10-325 MG tablet Take 1 tablet by mouth 3 (three) times daily as needed for pain.   megestrol (MEGACE) 20 MG tablet Take 20 mg by mouth daily.   metFORMIN (GLUCOPHAGE) 500 MG tablet Take 500 mg by mouth 2 (two) times daily with a meal.   naloxone (NARCAN) nasal spray 4 mg/0.1 mL Place 0.4 mg into the nose once.    nitroGLYCERIN (NITROSTAT) 0.4 MG SL tablet Place 0.4 mg under the tongue every 5 (five) minutes as needed for chest pain.   omeprazole (PRILOSEC) 40 MG capsule TAKE 1 CAPSULE 30 TO 60 MINUTES BEFORE FIRST MEAL OF THE DAY   ondansetron (ZOFRAN) 4 MG tablet Take 4 mg by mouth daily.   potassium chloride SA (KLOR-CON M) 20 MEQ tablet Take 1 tablet (20 mEq total) by mouth daily.   predniSONE (DELTASONE) 10 MG tablet Take 1 tablet (10 mg total) by mouth daily  with breakfast.   rosuvastatin (CRESTOR) 20 MG tablet Take 20 mg by mouth at bedtime.   Tiotropium Bromide Monohydrate (SPIRIVA RESPIMAT) 2.5 MCG/ACT AERS Inhale 2 puffs into the lungs daily.   Tiotropium Bromide Monohydrate (SPIRIVA RESPIMAT) 2.5 MCG/ACT AERS Inhale 2 puffs into the lungs daily.    Objective: BP (!) 157/93   Pulse 97   Ht 5' 1.5" (1.562 m)   Wt 110 lb (49.9 kg)   BMI 20.45 kg/m   Physical Exam:  General: Elderly female.  Alert and oriented.  No acute distress. Gait: Ambulates with the assistance of a walker.  Evaluation of left arm demonstrates tenderness within the trapezius, and the left side of her neck.  She has good range of motion with some pain in the left shoulder.  Tenderness to palpation over the posterior shoulder.  She has pain within the left wrist, with tenderness to palpation.  Pain in the wrist with range of motion.  She is able to make a fist.  She has intact sensation, with some radiating pains into the ulnar hand.  IMAGING: I personally ordered and reviewed the following images  X-rays of the cervical spine were obtained in clinic today.  There appears to be a well-healed fusion from C4-C7 with intact hardware.  No evidence of hardware failure.  Screws remain in good position.  Proximal to the construct, there are degenerative changes including loss of vertebral body height at C3.  Impression: Stable appearing cervical fusion without hardware failure and degenerative changes cranial to the  fusion   New Medications:  No orders of the defined types were placed in this encounter.     Oliver Barre, MD  12/10/2022 4:14 PM

## 2022-12-10 NOTE — Progress Notes (Signed)
GI Office Note    Referring Provider: Rebekah Chesterfield, NP Primary Care Physician:  Rebekah Chesterfield, NP Primary Gastroenterologist: Gerrit Friends.Rourk, MD  Date:  12/10/2022  ID:  Brian Orren, DOB Jan 25, 1951, MRN [id number]   Chief Complaint   Chief Complaint  Patient presents with   Anemia    Bleeding from somewhere. No energy, tired all the time.    History of Present Illness  Kennah Stoermer is a 72 y.o. female with a history of COPD on home oxygen, GERD, DM, HTN, Stroke, anxiety/depression, CAD, and polymyalgia rheumatica presenting today for further evaluation of worsening anemia.  Possible colonoscopy in 2015 at [facility].  No procedure report available.   Last office visit 02/12/22.  Unable to eat meat, collate soft foods.  Rice and bread sticks to her throat.  Has intermittent odynophagia.  Constant epigastric pain.  Has had abdominal soreness for 15 years.  Reported some weight loss.  NSAIDs only as needed.  Omeprazole once daily and Pepcid nightly controls GERD symptoms.  Reports constipation then have diarrhea with vomiting for about 6 months.  Taking dicyclomine 20 mg 4 times daily for many years prescribed for her epigastric pain.  On 2 L nasal cannula continuously.  Advised dicyclomine only as needed.  Continue PPI once daily.  Scheduled for EGD with dilation.   EGD 03/13/22: -Slightly ringed esophagus s/p dilation and biopsy -Normal stomach -Normal duodenum -Esophageal biopsies unremarkable, negative for EOE  Labs 11/07/22: Hgb 8.5, MCV 72. CMP unremarkable.  Labs 12/02/22: Hgb 7.7, MCV 70. TSH normal. BMP unremarkable.      Latest Ref Rng & Units 12/02/2022   11:40 AM 03/08/2022    2:25 PM 03/15/2021   11:17 AM  CBC  WBC 3.4 - 10.8 x10E3/uL 7.4  10.8  19.8   Hemoglobin 11.1 - 15.9 g/dL 7.7  9.6  40.9   Hematocrit 34.0 - 46.6 % 27.8  32.1  37.4   Platelets 150 - 450 x10E3/uL 364  313  366     Today: Anemia - Has had worsening shortness of breath over the last 3 weeks.  Increased oxygen from 2 to 3L. Uses it all the time except when she laves to smoke. Smokes about 8 cigarettes daily. No constipation or diarrhea. Gets very mildly constipated about once per month depending ion what she eats. Denis any melena or brbpr. Has a so/so appetite but has been like that for about 2 years. Does not have much energy at all - has been going for a while. Weight fluctuates from 105-115. Started iron this past Saturday and vomited it back up and it was a little dark.   GERD - Mostly has some mild upper abdominal pain but she states that has been tender for 50+ years. Does admit to vomiting quite a bit but takes zofran once a day and it lasts all day. She states if her stomach is not spasming then she may not take it but once symptoms start she will take it. Takes aleve every once in a while. Takes mucinex DM and coricidin fairly regularly to help with her breathing and congestion. Will occasionally take a tylenol or a baby aspirin.  No alcohol use. Takes dicyclomine as needed for stomach pains (if she wakes up with pain she takes dicyclomine and zofran together). Has been tacking omeprazole 40 mg once daily. Denies any heartburn symptoms. Does have some mild food aversions.   Only takes metformin once daily (morning).    Current Outpatient Medications  Medication Sig Dispense Refill   acetaminophen (TYLENOL) 650 MG CR tablet Take 650 mg by mouth every 8 (eight) hours as needed for pain.     albuterol (PROVENTIL) (2.5 MG/3ML) 0.083% nebulizer solution Take 3 mLs (2.5 mg total) by nebulization every 4 (four) hours as needed for wheezing or shortness of breath. 1080 mL 3   albuterol (VENTOLIN HFA) 108 (90 Base) MCG/ACT inhaler Inhale 2 puffs into the lungs every 4 (four) hours as needed for wheezing or shortness of breath. for wheezing 18 g 1   amLODipine (NORVASC) 5 MG tablet Take 5 mg by mouth daily.     ARIPiprazole (ABILIFY) 5 MG tablet Take 5 mg by mouth daily.      budesonide-formoterol (SYMBICORT) 160-4.5 MCG/ACT inhaler Inhale 2 puffs into the lungs in the morning and at bedtime. 10.2 g 6   busPIRone (BUSPAR) 5 MG tablet Take 5 mg by mouth 2 (two) times daily.     cyclobenzaprine (FLEXERIL) 10 MG tablet Take 10 mg by mouth 2 (two) times daily.     dicyclomine (BENTYL) 20 MG tablet Take 20 mg by mouth 4 (four) times daily.     famotidine (PEPCID) 20 MG tablet TAKE 1 TABLET DAILY AFTER SUPPER 90 tablet 3   fluticasone (FLONASE) 50 MCG/ACT nasal spray Place 2 sprays into both nostrils daily.     HYDROcodone-acetaminophen (NORCO) 10-325 MG tablet Take 1 tablet by mouth 3 (three) times daily as needed for pain.     megestrol (MEGACE) 20 MG tablet Take 20 mg by mouth daily.     metFORMIN (GLUCOPHAGE) 500 MG tablet Take 500 mg by mouth 2 (two) times daily with a meal.     naloxone (NARCAN) nasal spray 4 mg/0.1 mL Place 0.4 mg into the nose once.     nitroGLYCERIN (NITROSTAT) 0.4 MG SL tablet Place 0.4 mg under the tongue every 5 (five) minutes as needed for chest pain.     omeprazole (PRILOSEC) 40 MG capsule TAKE 1 CAPSULE 30 TO 60 MINUTES BEFORE FIRST MEAL OF THE DAY 90 capsule 3   ondansetron (ZOFRAN) 4 MG tablet Take 4 mg by mouth daily.     predniSONE (DELTASONE) 10 MG tablet Take 1 tablet (10 mg total) by mouth daily with breakfast. 14 tablet 0   rosuvastatin (CRESTOR) 20 MG tablet Take 20 mg by mouth at bedtime.     Tiotropium Bromide Monohydrate (SPIRIVA RESPIMAT) 2.5 MCG/ACT AERS Inhale 2 puffs into the lungs daily. 4 g 11   Tiotropium Bromide Monohydrate (SPIRIVA RESPIMAT) 2.5 MCG/ACT AERS Inhale 2 puffs into the lungs daily. 4 g 0   potassium chloride SA (KLOR-CON M) 20 MEQ tablet Take 1 tablet (20 mEq total) by mouth daily. 5 tablet 0   No current facility-administered medications for this visit.    Past Medical History:  Diagnosis Date   Anxiety    Arthritis    Bipolar affective disorder (HCC)    CAD (coronary artery disease)    Chronic  respiratory failure (HCC)    Complication of anesthesia    COPD (chronic obstructive pulmonary disease) (HCC)    Depression    DM (diabetes mellitus) (HCC)    GERD (gastroesophageal reflux disease)    HTN (hypertension)    Polymyalgia rheumatica (HCC)    PONV (postoperative nausea and vomiting)    Stroke [facility])     Past Surgical History:  Procedure Laterality Date   ABDOMINAL HYSTERECTOMY N/A    ANTERIOR FUSION CERVICAL SPINE  2012  C5-7   BACK SURGERY     BIOPSY  03/13/2022   Procedure: BIOPSY;  Surgeon: Corbin Ade, MD;  [facility]: [facility];  Service: Endoscopy;;   ESOPHAGOGASTRODUODENOSCOPY (EGD) WITH PROPOFOL N/A 03/13/2022   Procedure: ESOPHAGOGASTRODUODENOSCOPY (EGD) WITH PROPOFOL;  Surgeon: Corbin Ade, MD;  Location: [facility];  Service: Endoscopy;  Laterality: N/A;  12:30pm   MALONEY DILATION N/A 03/13/2022   Procedure: MALONEY DILATION;  Surgeon: Corbin Ade, MD;  Location: [facility];  Service: Endoscopy;  Laterality: N/A;   SHOULDER SURGERY      Family History  Problem Relation Age of Onset   Hypertension Mother    Breast cancer Sister    Colon cancer Neg Hx     Allergies as of 12/10/2022 - Review Complete 12/10/2022  Allergen Reaction Noted   Alendronate Other (See Comments) 02/08/2014   Ibuprofen Nausea And Vomiting 11/04/2013   Ramipril Nausea And Vomiting and Other (See Comments) 11/04/2013   Sulfabenzamide Other (See Comments) 11/04/2013    Social History   Socioeconomic History   Marital status: Single    Spouse name: Not on file   Number of children: Not on file   Years of education: Not on file   Highest education level: Not on file  Occupational History   Not on file  Tobacco Use   Smoking status: Every Day    Packs/day: 0.50    Years: 50.00    Additional pack years: 0.00    Total pack years: 25.00    Types: Cigarettes   Smokeless tobacco: Never  Vaping Use   Vaping Use: Never used  Substance and Sexual Activity    Alcohol use: Never   Drug use: Never   Sexual activity: Not on file  Other Topics Concern   Not on file  Social History Narrative   Not on file   Social Determinants of Health   Financial Resource Strain: Not on file  Food Insecurity: Not on file  Transportation Needs: Not on file  Physical Activity: Not on file  Stress: Not on file  Social Connections: Not on file     Review of Systems   Gen: Denies fever, chills, anorexia. Denies fatigue, weakness, weight loss.  CV: Denies chest pain, palpitations, syncope, peripheral edema, and claudication. Resp: Denies dyspnea at rest, cough, wheezing, coughing up blood, and pleurisy. GI: See HPI Derm: Denies rash, itching, dry skin Psych: Denies depression, anxiety, memory loss, confusion. No homicidal or suicidal ideation.  Heme: Denies bruising, bleeding, and enlarged lymph nodes.   Physical Exam   BP 138/77 (BP Location: Right Arm, Patient Position: Sitting, Cuff Size: Normal)   Pulse (!) 108   Temp 98.4 F (36.9 C) (Temporal)   Ht 5\' 1"  (1.549 m)   Wt 112 lb 12.8 oz (51.2 kg)   SpO2 92%   BMI 21.31 kg/m   General:   Alert and oriented. No distress noted. Pleasant and cooperative.  Head:  Normocephalic and atraumatic. Eyes:  Conjuctiva clear without scleral icterus. Mouth:  Oral mucosa pink and moist. Good dentition. No lesions. Lungs:  Clear to auscultation bilaterally. No wheezes, rales, or rhonchi. No distress.  Heart:  S1, S2 present without murmurs appreciated.  Abdomen:  +BS, soft, non-distended.  Mild TTP to epigastrium.  No rebound or guarding. No HSM or masses noted. Rectal: deferred Msk:  Symmetrical without gross deformities. Normal posture. Extremities:  Without edema. Neurologic:  Alert and  oriented x4 Psych:  Alert and cooperative. Normal  mood and affect.   Assessment  Adriyanna Dierolf is a 72 y.o. female with a history of COPD on home oxygen, GERD, DM, HTN, Stroke, anxiety/depression, CAD, and polymyalgia  rheumatica presenting toady at the request of pulmonology given worsening anemia.   GERD, chronic epigastric pain, nausea/vomiting: Currently on omeprazole 40 mg once daily and famotidine in the evenings.  Occasional breakthrough reflux symptoms but not frequent.  Although this is true given the anemia we will increase PPI to twice daily.  Continues dicyclomine for chronic epigastric pain.  Has been having almost daily nausea and vomiting that is well-controlled with Zofran.  Hopefully increasing PPI will also help with this.  Reported an episode of dark emesis after taking iron tablet.   Microcytic anemia: Has been having progressive worsening anemia.  Hemoglobin normal in September 2022.  Down to 9.6 in August 2023.  Currently down to 7.7 as of last week.  Pulmonology started on iron supplementation every other day for which she has taken 2 doses.  Advised her to continue this and although she has had 2 doses of iron we will check an iron panel as well as B12 and folate.  If she is unable to tolerate oral iron she may need referral to hematology for consideration of IV iron infusions.  She denies any melena or BRBPR, change in bowel habits, unintentional weight loss.  She has a baseline lack of appetite over the last 2 years given her COPD.  Has been experiencing some worsening shortness of breath as well as significant fatigue and intermittent chest pain.  Denies any regular NSAID use or alcohol use.  Does admit to regular tobacco use.  Last EGD in August 2023 overall normal.  Last colonoscopy in 2015 at [facility], report unavailable.  Given she has had such a large drop and she is experiencing worsening shortness of breath which pulmonology does not feel as though it is worsening COPD we will proceed with EGD and colonoscopy for further evaluation.  Procedures likely not able to be done until July therefore we will keep a close eye on lab values until time for procedure and transfuse or refer for IV iron  infusions prior to procedures if needed.  Differentials include esophagitis, gastritis, duodenitis, peptic ulcer disease, AVMs, malignancy, etc.  PLAN   H/H, Iron panel, B12, folate Continue iron every other day.  Proceed with upper endoscopy and colonoscopy with propofol by Dr. Jena Gauss in near future: the risks, benefits, and alternatives have been discussed with the patient in detail. The patient states understanding and desires to proceed. ASA 3 Clearance from pulmonology.  Hold iron for 7 days Hold metformin morning of procedure May take Zofran every 8 hours as needed for nausea while drinking prep Increase omeprazole to 40 mg BID.  Request colonoscopy records from [facility] May need frequent check of Hgb until procedures depending on results. Follow up in 3 months.     Brooke Bonito, MSN, FNP-BC, AGACNP-BC [facility]

## 2022-12-10 NOTE — Patient Instructions (Addendum)
Please have blood work completed at [facility].  We will call you with results once they have been received. Please allow 3-5 business days for review. 2 locations for [facility] in [place]:              1. [address],               2. [address] [place]   We are scheduling for an upper endoscopy and colonoscopy in the near future with Dr. Jena Gauss.  Will obtain clearance from Dr. Sherene Sires and then plan to get you scheduled.  It may possibly be July before we can get you in so in the meantime we will closely monitor your hemoglobin.  While we await your procedure I want you to increase your omeprazole to 40 mg twice daily.  For now continue taking your iron every other day until you receive your lab results.  You will need to hold this for 7 days prior to your procedure.  This will be written on your procedure instructions.  It was a pleasure to see you today. I want to create trusting relationships with patients. If you receive a survey regarding your visit,  I greatly appreciate you taking time to fill this out on paper or through your MyChart. I value your feedback.  Brooke Bonito, MSN, FNP-BC, AGACNP-BC [facility]

## 2022-12-10 NOTE — Telephone Encounter (Signed)
  Request for patient to stop medication prior to procedure or is needing cleareance  12/10/22  Candace Harris 13-Oct-1950  What type of surgery is being performed? Colonoscopy/Esophagogastroduodenoscopy (EGD)   When is surgery scheduled? TBD  What type of clearance is required (medical or pharmacy to hold medication or both? medical  Pt needs to have clearance prior to being scheduled for procedures.  Name of physician performing surgery?  Dr.Carver Cidra [facility] at [employer]: [phone] Fax: [phone]  Anethesia type (none, local, MAC, general)? MAC

## 2022-12-10 NOTE — Patient Instructions (Signed)

## 2022-12-11 LAB — CBC
Hematocrit: 30 % — ABNORMAL LOW (ref 34.0–46.6)
Hemoglobin: 8.3 g/dL — ABNORMAL LOW (ref 11.1–15.9)
MCH: 19.5 pg — ABNORMAL LOW (ref 26.6–33.0)
MCHC: 27.7 g/dL — ABNORMAL LOW (ref 31.5–35.7)
MCV: 70 fL — ABNORMAL LOW (ref 79–97)
Platelets: 348 10*3/uL (ref 150–450)
RBC: 4.26 x10E6/uL (ref 3.77–5.28)
RDW: 19.4 % — ABNORMAL HIGH (ref 11.7–15.4)
WBC: 15.9 10*3/uL — ABNORMAL HIGH (ref 3.4–10.8)

## 2022-12-11 LAB — B12 AND FOLATE PANEL
Folate: 7.4 ng/mL (ref >3.0)
Vitamin B-12: 372 pg/mL (ref 232–1245)

## 2022-12-11 LAB — IRON,TIBC AND FERRITIN PANEL
Ferritin: 17 ng/mL (ref 15–150)
Iron Saturation: 3 % — CL (ref 15–55)
Iron: 14 ug/dL — ABNORMAL LOW (ref 27–139)
Total Iron Binding Capacity: 485 ug/dL — ABNORMAL HIGH (ref 250–450)
UIBC: 471 ug/dL — ABNORMAL HIGH (ref 118–369)

## 2022-12-11 NOTE — Telephone Encounter (Signed)
Last ov note reviewed - her doe is likely largely related to her anemia so the procedure is needed to identify/ correct the source and no contraindication from a pulmonary perspective

## 2022-12-12 NOTE — Telephone Encounter (Signed)
Will call pt once gets providers schedule for July

## 2022-12-12 NOTE — Progress Notes (Signed)
Spoke with pt and notified of results per Dr. Wert. Pt verbalized understanding and denied any questions. 

## 2022-12-16 ENCOUNTER — Telehealth: Payer: Self-pay | Admitting: *Deleted

## 2022-12-16 NOTE — Telephone Encounter (Signed)
[facility]  TCS/EGD w/Dr.rourk, asa 3

## 2022-12-17 ENCOUNTER — Other Ambulatory Visit: Payer: Self-pay | Admitting: *Deleted

## 2022-12-17 DIAGNOSIS — D509 Iron deficiency anemia, unspecified: Secondary | ICD-10-CM

## 2022-12-23 ENCOUNTER — Ambulatory Visit (HOSPITAL_COMMUNITY): Admission: RE | Admit: 2022-12-23 | Payer: Medicare HMO | Source: Ambulatory Visit

## 2022-12-31 ENCOUNTER — Telehealth: Payer: Self-pay

## 2022-12-31 ENCOUNTER — Telehealth: Payer: Self-pay | Admitting: *Deleted

## 2022-12-31 ENCOUNTER — Other Ambulatory Visit (HOSPITAL_COMMUNITY): Payer: Self-pay

## 2022-12-31 ENCOUNTER — Other Ambulatory Visit: Payer: Self-pay | Admitting: *Deleted

## 2022-12-31 DIAGNOSIS — D509 Iron deficiency anemia, unspecified: Secondary | ICD-10-CM

## 2022-12-31 MED ORDER — INCRUSE ELLIPTA 62.5 MCG/ACT IN AEPB: 1.0000 | INHALATION_SPRAY | Freq: Every day | RESPIRATORY_TRACT | 5 refills | Status: DC

## 2022-12-31 NOTE — Telephone Encounter (Signed)
Dr. Sherene Sires, please advise if okay to switch. Thanks

## 2022-12-31 NOTE — Telephone Encounter (Signed)
*  Pulm  PA request received for Spiriva Respimat 2.5MCG/ACT aerosol  PA not submitted due to Incruse Ellipta being preferred by the patients plan at this time.  *Per test claim Incruse-$0.00  Key: [facility]

## 2022-12-31 NOTE — Telephone Encounter (Signed)
Ok but pharmacist will need to instruct then we'll need to see her back p one month to assure it's the right choice as a substitute for her spiriva

## 2022-12-31 NOTE — Telephone Encounter (Signed)
Mailed lab requisitions to be done the first week of July.

## 2022-12-31 NOTE — Telephone Encounter (Signed)
Patient is aware of below message/recommendations. She voiced her understanding.  Incruse sent to preferred pharmacy. Appt scheduled 02/04/2023 at 9:30 with Dr. Sherene Sires. Nothing further needed.

## 2023-01-12 NOTE — Progress Notes (Unsigned)
Candace Harris, female    DOB: 07/25/1950    MRN: [id number]   Brief patient profile:  12 yobf active smoker with GOLD I COPD by pfts 02/2019 transferred care to [facility] office 11/02/2020 p admit (see below)   From Dr Carlis Abbott 11/24/19 Elderly BF with a history of tobacco abuse and COPD who presents for evaluation of dyspnea on exertion.   She was diagnosed with COPD about 2 years ago but has had progressively worsening shortness of breath over the last 2 years.  She also has cough, sputum production, wheezing, but dyspnea on exertion is her main complaint.  Her activity is fairly limited; she is only able to walk a few steps around her house without stopping.   She is on 3 L supplemental oxygen at home.  She was prescribed 2.5 L but had ongoing dyspnea on titrated up to 3 L.  Her home saturations are usually in the 90s, but sometimes drop into the 80s.  She is currently using her Trelegy inhaler daily with frequent albuterol.  She continues to smoke 0.25- 0.5 packs/day; she has smoked 0.5 ppd for the last 50 years.     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  Rec: trelegy Check echo: 03/19/61  G I diastolic dysfunction / no cor pulmonale    Stopped trelegy  mid Jan 2022 due irritated mouth   Admit date: 08/15/2020 Discharge date: 08/18/2020 Brief Hospitalization Summary: Please see all hospital notes, images, labs for full details of the hospitalization. ADMISSION HPI: Candace Harris is a 72 y.o. female with medical history significant for HTN, CAD, anxiety, COPD (on 3-6 L of O2 at home), T2DM, GERD and bipolar disorder who presents to the emergency department due to 18-month onset of shortness of breath that has been progressively worsening, she complained of decreased oral intake due to loss of taste, she also endorsed loss of smell and she states that she had diarrhea for 2 to 3 weeks last month which has resolved.  She states that she saw her PCP in [place] who prescribed prednisone  for her, but she has not yet filled the prescription.  She decided to go to the ED for further evaluation due to persistent worsening shortness of breath and concern for dehydration.   ED Course: In the emergency department, she was tachycardic and intermittently tachypneic. Work-up in the ED showed leukocytosis, thrombocytosis, hypokalemia, hyponatremia, BUN/creatinine 41/1.70 (baseline creatinine was 0.7). D-dimer 0.78. Chest x-ray was reflective of emphysema but showed no active cardiopulmonary disease Breathing treatment was provided, Solu-Medrol 125 Mg x1 was given, IV Ativan Librium x1 due to anxiety was given and patient was provided with 500 mL of IV NS. Hospitalist was asked to admit patient for further evaluation and management.   Hospital Course    Acute on chronic respiratory failure with hypoxia - secondary to COPD exacerbation.  Pt much improved after IV steroids, bronchodilators and supportive measures. .  Prednisone taper.   Hypomagnesemia - IV replacement given and repleted.  Hypokalemia - repleted.  Essential hypertension - continue home meds GERD - protonix for GI protection.  Type 2 DM - monitored and stable.   AKI - resolved after hydration.    Discharge Diagnoses:  Principal Problem:   Shortness of breath Active Problems:   AKI (acute kidney injury) (HCC)   Dehydration   Leukocytosis   Thrombocytosis   Essential hypertension   Elevated d-dimer   Hypokalemia   Anxiety   COPD  with acute exacerbation (HCC)   Hyperlipidemia   Diabetes mellitus (HCC)   GERD (gastroesophageal reflux disease)   CAD (coronary artery disease)        History of Present Illness  11/02/2020  Pulmonary/ 1st office eval/ Candace Harris / [facility] GOLD I copd with component of UACS/ anxiety  Chief Complaint  Patient presents with   Follow-up    Productive cough with white phlegm since January 2022  Dyspnea:  Can do 17 steps at appt complex but has to stop at top to recover / last  shopping x 2 y prior to OV   Cough: hocking min white daytime off gerd rx  Sleep: on side flat bed s resp symptoms "as long as I have on my oxygen"  SABA use: avg 6-8 x per day / no maint rx  02 2lpm 24/7 rec Plan A = Automatic = Always=    Breztri Take 2 puffs first thing in am and then another 2 puffs about 12 hours later.  Work on inhaler technique: Plan B = Backup (to supplement plan A, not to replace it) Only use your albuterol inhaler as a rescue medication Try prilosec 40mg   Take 30-60 min before first meal of the day and Pepcid ac (famotidine) 20 mg one after supper  Until return  GERD diet  Please schedule a follow up office visit in 6 weeks, call sooner if needed with all medications /inhalers/ solutions in hand so we can verify exactly what you are taking. This includes all medications from all doctors and over the counters          05/30/2022  f/u ov/[place] office/Candace Harris re: GOLD 1  maint on breztri 2bi  Chief Complaint  Patient presents with   Follow-up    Breathing doing okay since last ov    Dyspnea:  confined to house mostly, doe room to room  Cough: none  Sleeping: flat bed/ on side/ lots of pillows  x years SABA use: up to 3 x daily  02: 2.5 lpm 24/7  Covid status: never vax/ never infected  Lung cancer screening: in program  Rec No change in medications The key is to stop smoking completely before smoking completely stops you! We will refer you again for best fit for portable 02 ? POC if possible  Make sure you check your oxygen saturation  AT  your highest level of activity (not after you stop)   to be sure it stays over 90%  Ok to try albuterol 15 min before an activity (on alternating days with inhaler vs nebulizer)   that you know would usually make you short of breath        12/02/2022  6 m f/u ov/[place] office/Candace Harris re: GOLD 1 copd  maint on symbiocrt 160 / still smoking   Chief Complaint  Patient presents with   Follow-up    Breathing has been  worse over the past 2 months. She states not able to walk any distance due to SOB. She is using her albuterol inhaler 3-5 x per day.   Dyspnea:  bad x 2 months, 2 weeks even worse s assoc cp / cough  Cough: none Sleeping: flat 3 pillows  SABA use: up some hfa/ neb one twice daily neb  02: increased to 3lpm 14/7 x takes off when smokes Rec Plan A = Automatic = Always=    symbicort 160 and spiriva 2 puffs of each 1st thing in am and then another 2 puffs 12 hours  later Work on inhaler technique: Plan B = Backup (to supplement plan A, not to replace it) Only use your albuterol inhaler as a rescue medication  Plan C = Crisis (instead of Plan B but only if Plan B stops working) - only use your albuterol nebulizer if you first try Plan B.     Please remember to go to the  x-ray department  > no acute change  Please schedule a follow up office visit in 6 weeks, call sooner if needed with all medications /inhalers/ solutions in hand  01/13/2023  f/u ov/[place] office/Tniyah Nakagawa re: GOLD 1 copd  and anemia maint on symbicort 160/spiriva  did not  bring meds except hfa / still smoking  Chief Complaint  Patient presents with   Follow-up   Dyspnea:  same as last ov/ anemia has not been corrected but w/u in progress  Cough: varies min mucoid  Sleeping: flat bed 3 pillows  SABA use: p exercise only 02: 2.5 to 3lpm not titrating      No obvious day to day or daytime variability or assoc excess/ purulent sputum or mucus plugs or hemoptysis or cp or chest tightness, subjective wheeze or overt sinus or hb symptoms.   Sleeping  without nocturnal  or early am exacerbation  of respiratory  c/o's or need for noct saba. Also denies any obvious fluctuation of symptoms with weather or environmental changes or other aggravating or alleviating factors except as outlined above   No unusual exposure hx or h/o childhood pna/ asthma or knowledge of premature birth.  Current Allergies, Complete Past Medical History,  Past Surgical History, Family History, and Social History were reviewed in [employer] record.  ROS  The following are not active complaints unless bolded Hoarseness, sore throat, dysphagia, dental problems, itching, sneezing,  nasal congestion or discharge of excess mucus or purulent secretions, ear ache,   fever, chills, sweats, unintended wt loss or wt gain, classically pleuritic or exertional cp,  orthopnea pnd or arm/hand swelling  or leg swelling, presyncope, palpitations, abdominal pain, anorexia, nausea, vomiting, diarrhea  or change in bowel habits or change in bladder habits, change in stools or change in urine, dysuria, hematuria,  rash, arthralgias, visual complaints, headache, numbness, weakness or ataxia or problems with walking or coordination,  change in mood or  memory.        Current Meds - - NOTE:   Unable to verify as accurately reflecting what pt takes    Medication Sig   acetaminophen (TYLENOL) 650 MG CR tablet Take 650 mg by mouth every 8 (eight) hours as needed for pain.   albuterol (PROVENTIL) (2.5 MG/3ML) 0.083% nebulizer solution Take 3 mLs (2.5 mg total) by nebulization every 4 (four) hours as needed for wheezing or shortness of breath.   albuterol (VENTOLIN HFA) 108 (90 Base) MCG/ACT inhaler Inhale 2 puffs into the lungs every 4 (four) hours as needed for wheezing or shortness of breath. for wheezing   amLODipine (NORVASC) 5 MG tablet Take 5 mg by mouth daily.   ARIPiprazole (ABILIFY) 5 MG tablet Take 5 mg by mouth daily.   budesonide-formoterol (SYMBICORT) 160-4.5 MCG/ACT inhaler Inhale 2 puffs into the lungs in the morning and at bedtime.   busPIRone (BUSPAR) 5 MG tablet Take 5 mg by mouth 2 (two) times daily.   cyclobenzaprine (FLEXERIL) 10 MG tablet Take 10 mg by mouth 2 (two) times daily.   dicyclomine (BENTYL) 20 MG tablet Take 20 mg by mouth 4 (four) times  daily.   famotidine (PEPCID) 20 MG tablet TAKE 1 TABLET DAILY AFTER SUPPER    fluticasone (FLONASE) 50 MCG/ACT nasal spray Place 2 sprays into both nostrils daily.   HYDROcodone-acetaminophen (NORCO) 10-325 MG tablet Take 1 tablet by mouth 3 (three) times daily as needed for pain.   megestrol (MEGACE) 20 MG tablet Take 20 mg by mouth daily.   metFORMIN (GLUCOPHAGE) 500 MG tablet Take 500 mg by mouth 2 (two) times daily with a meal.   naloxone (NARCAN) nasal spray 4 mg/0.1 mL Place 0.4 mg into the nose once.   nitroGLYCERIN (NITROSTAT) 0.4 MG SL tablet Place 0.4 mg under the tongue every 5 (five) minutes as needed for chest pain.   omeprazole (PRILOSEC) 40 MG capsule TAKE 1 CAPSULE 30 TO 60 MINUTES BEFORE FIRST MEAL OF THE DAY   ondansetron (ZOFRAN) 4 MG tablet Take 4 mg by mouth daily.   potassium chloride SA (KLOR-CON M) 20 MEQ tablet Take 1 tablet (20 mEq total) by mouth daily.   predniSONE (DELTASONE) 10 MG tablet Take 1 tablet (10 mg total) by mouth daily with breakfast.   rosuvastatin (CRESTOR) 20 MG tablet Take 20 mg by mouth at bedtime.   Tiotropium Bromide Monohydrate (SPIRIVA RESPIMAT) 2.5 MCG/ACT AERS Inhale 2 each into the lungs daily. 2 puffs daily            Past Medical History:  Diagnosis Date   Anxiety    Bipolar affective disorder (HCC)    CAD (coronary artery disease)    Chronic respiratory failure (HCC)    COPD (chronic obstructive pulmonary disease) (HCC)    DM (diabetes mellitus) (HCC)    GERD (gastroesophageal reflux disease)    HTN (hypertension)    Polymyalgia rheumatica (HCC)           Objective:    Wt  01/13/2023           112  12/02/2022        110  05/30/2022      107  04/23/2022      104  03/25/2022        103  09/24/2021        107 03/15/2021         107  12/14/20 107 lb 12.8 oz (48.9 kg)  11/02/20 112 lb 12.8 oz (51.2 kg)  08/15/20 132 lb 4.4 oz (60 kg)    Vital signs reviewed  01/13/2023  - Note at rest 02 sats  86% on 3lpm POC   General appearance:    elderly thin bf walks with walker   HEENT : Oropharynx  clear      NECK :  without  apparent JVD/ palpable Nodes/TM    LUNGS: no acc muscle use,  Mild barrel  contour chest wall with bilateral  Distant bs s audible wheeze and  without cough on insp or exp maneuvers  and mild  Hyperresonant  to  percussion bilaterally     CV:  RRR  no s3 or murmur or increase in P2, and no edema   ABD:  soft and nontender with pos end  insp Hoover's  in the supine position.  No bruits or organomegaly appreciated   MS:  Nl gait/ ext warm without deformities Or obvious joint restrictions  calf tenderness, cyanosis or clubbing     SKIN: warm and dry without lesions    NEURO:  alert, approp, nl sensorium with  no motor or cerebellar deficits apparent.  Lab Results  Component Value Date   HGB 8.3 (L) 12/10/2022   HGB 7.7 (L) 12/02/2022   HGB 9.6 (L) 03/08/2022   HGB 11.7 03/15/2021           Assessment

## 2023-01-13 ENCOUNTER — Encounter: Payer: Self-pay | Admitting: Internal Medicine

## 2023-01-13 ENCOUNTER — Ambulatory Visit (INDEPENDENT_AMBULATORY_CARE_PROVIDER_SITE_OTHER): Payer: Medicare HMO | Admitting: Internal Medicine

## 2023-01-13 VITALS — BP 108/65 | HR 113 | Ht 61.0 in | Wt 112.4 lb

## 2023-01-13 DIAGNOSIS — J449 Chronic obstructive pulmonary disease, unspecified: Secondary | ICD-10-CM | POA: Diagnosis not present

## 2023-01-13 DIAGNOSIS — J9611 Chronic respiratory failure with hypoxia: Secondary | ICD-10-CM | POA: Diagnosis not present

## 2023-01-13 NOTE — Assessment & Plan Note (Signed)
Active smoker -  PFT's  11/02/2020  FEV1 1.42 (85 % ) ratio 0.67  p 0 % improvement from saba p ? 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  - 12/02/2022    >  added spiriva 2.5 x 2 puffs each am - 01/13/2023  After extensive coaching inhaler device,  effectiveness =    75% smi/hfa    Group D (now reclassified as E) in terms of symptom/risk and laba/lama/ICS  therefore appropriate rx at this point >>>  symbicort 160/spiriva 2.5 each am and then symbicort 160 12 h later plus approp saba:  Re SABA :  I spent extra time with pt today reviewing appropriate use of albuterol for prn use on exertion with the following points: 1) saba is for relief of sob that does not improve by walking a slower pace or resting but rather if the pt does not improve after trying this first. 2) If the pt is convinced, as many are, that saba helps recover from activity faster then it's easy to tell if this is the case by re-challenging : ie stop, take the inhaler, then p 5 minutes try the exact same activity (intensity of workload) that just caused the symptoms and see if they are substantially diminished or not after saba 3) if there is an activity that reproducibly causes the symptoms, try the saba 15 min before the activity on alternate days   If in fact the saba really does help, then fine to continue to use it prn but advised may need to look closer at the maintenance regimen being used to achieve better control of airways disease with exertion.

## 2023-01-13 NOTE — Patient Instructions (Addendum)
Plan A = Automatic = Always=    symbicort 160 and spiriva 2 puffs of each 1st thing in am and then another 2 puffs symbicort  12 hours later  Work on inhaler technique:  relax and gently blow all the way out then take a nice smooth full deep breath back in, triggering the inhaler at same time you start breathing in.  Hold breath in for at least  5 seconds if you can.  Rinse and gargle with water when done.  If mouth or throat bother you at all,  try brushing teeth/gums/tongue with arm and hammer toothpaste/ make a slurry and gargle and spit out.  - take practice breaths of spiriva before you wind it up       Plan B = Backup (to supplement plan A, not to replace it) Only use your albuterol inhaler as a rescue medication to be used if you can't catch your breath by resting or doing a relaxed purse lip breathing pattern.  - The less you use it, the better it will work when you need it. - Ok to use the inhaler up to 2 puffs  every 4 hours if you must but call for appointment if use goes up over your usual need - Don't leave home without it !!  (think of it like the spare tire for your car)   Plan C = Crisis (instead of Plan B but only if Plan B stops working) - only use your albuterol nebulizer if you first try Plan B and it fails to help > ok to use the nebulizer up to every 4 hours but if start needing it regularly call for immediate appointment  Also  Ok to try albuterol 15 min before an activity (on alternating days between your inhaler/ nebulizer)  that you know would usually make you short of breath and see if it makes any difference and if makes none then don't take albuterol after activity unless you can't catch your breath as this means it's the resting that helps, not the albuterol.      Make sure you check your oxygen saturation  AT  your highest level of activity (not after you stop)   to be sure it stays over 90% and adjust  02 flow upward to maintain this level if needed but remember to  turn it back to previous settings when you stop (to conserve your supply).     Please schedule a follow up office visit in 3 months call sooner if needed with all medications /inhalers/ solutions in hand so we can verify exactly what you are taking. This includes all medications from all doctors and over the counters

## 2023-01-13 NOTE — Assessment & Plan Note (Signed)
02 x around 2019 by Dr Juanetta Gosling -  11/02/2020   Walked RA  approx   450 ft  @ mod pace  stopped due to  [employer] /fatigue with sats still 94% - 03/15/2021   Walked on RA x  3  lap(s) =  approx 450  @ slow to mod pace, stopped due to end of study  with lowest 02 sats 94%   -  04/23/2022 Patient Saturations on Room Air at Rest = 88% Patient Saturations on 2.5LO2 of oxygen at rest= 93% Patient Saturations on 2.5 Liters of oxygen while Ambulating =91% lap one = 150 ft  2.5 Liters of oxygen while Ambulating= 90% lap two = 300 ft, slow rollator pace   - 05/30/2022   Walked on 2.5 lpm   x  2  lap(s) =  approx 399  ft  @ slow pace, stopped due to tired with lowest 02 sats 94% > refer for POC      Given persitent anemia it is critical she understand how to titrate 02 to sats > 90% Advised: Make sure you check your oxygen saturation  AT  your highest level of activity (not after you stop)   to be sure it stays over 90% and adjust  02 flow upward to maintain this level if needed but remember to turn it back to previous settings when you stop (to conserve your supply).   F/u q 3 m, sooner prn         Each maintenance medication was reviewed in detail including emphasizing most importantly the difference between maintenance and prns and under what circumstances the prns are to be triggered using an action plan format where appropriate.  Total time for H and P, chart review, counseling, reviewing hfa/smi/02 device(s) and generating customized AVS unique to this office visit / same day charting = 26 min

## 2023-01-14 LAB — IRON,TIBC AND FERRITIN PANEL
Ferritin: 39 ng/mL (ref 15–150)
Iron Saturation: 5 % — CL (ref 15–55)
Iron: 20 ug/dL — ABNORMAL LOW (ref 27–139)
Total Iron Binding Capacity: 387 ug/dL (ref 250–450)
UIBC: 367 ug/dL (ref 118–369)

## 2023-01-14 LAB — HEMOGLOBIN: Hemoglobin: 10.2 g/dL — ABNORMAL LOW (ref 11.1–15.9)

## 2023-01-15 ENCOUNTER — Encounter: Payer: Self-pay | Admitting: *Deleted

## 2023-01-28 NOTE — Telephone Encounter (Signed)
Called pt and received message "your call cannot be completed as dialed".  Tried again and received same message. Letter mailed to patient.

## 2023-02-04 ENCOUNTER — Ambulatory Visit: Payer: Medicare HMO | Admitting: Internal Medicine

## 2023-02-04 ENCOUNTER — Telehealth: Payer: Self-pay | Admitting: *Deleted

## 2023-02-04 NOTE — Telephone Encounter (Signed)
Spoke to pt, informed her of results and recommendations. Pt voiced understanding.  

## 2023-02-06 ENCOUNTER — Encounter: Payer: Self-pay | Admitting: Gastroenterology

## 2023-03-05 NOTE — Progress Notes (Unsigned)
GI Office Note    Referring Provider: Rebekah Chesterfield, NP Primary Care Physician:  Rebekah Chesterfield, NP Primary Gastroenterologist: Gerrit Friends.Rourk, MD  Date:  03/06/2023  ID:  Candace Harris, DOB [birth year], MRN [id number]   Chief Complaint   Chief Complaint  Patient presents with   Weight Loss    Still losing weight. No appetite    History of Present Illness  Candace Harris is a 72 y.o. female with a history of COPD on home oxygen, GERD, DM, HTN, Stroke, anxiety/depression, CAD, and polymyalgia rheumatica presenting today for follow-up of anemia.  Possible colonoscopy in 2015 at [facility].  No procedure report available.    Last office visit 02/12/22.  Unable to eat meat, collate soft foods.  Rice and bread sticks to her throat.  Has intermittent odynophagia.  Constant epigastric pain.  Has had abdominal soreness for 15 years.  Reported some weight loss.  NSAIDs only as needed.  Omeprazole once daily and Pepcid nightly controls GERD symptoms.  Reports constipation then have diarrhea with vomiting for about 6 months.  Taking dicyclomine 20 mg 4 times daily for many years prescribed for her epigastric pain.  On 2 L nasal cannula continuously.  Advised dicyclomine only as needed.  Continue PPI once daily.  Scheduled for EGD with dilation.    EGD 03/13/22: -Slightly ringed esophagus s/p dilation and biopsy -Normal stomach -Normal duodenum -Esophageal biopsies unremarkable, negative for EOE   Labs 11/07/22: Hgb 8.5, MCV 72. CMP unremarkable.  Labs 12/02/22: Hgb 7.7, MCV 70. TSH normal. BMP unremarkable  Last office visit 12/10/2022.  She reported worsening shortness of breath over the prior 3 weeks her oxygen level.  Admits to smoking about 5 cigarettes daily.  Denies any constipation or diarrhea.  Appetite has been poor for the last 2 years.  Needs to decrease in energy.  Reportedly her weight is fluctuated from 105-115 pounds.  Had recently started iron but vomited back up upper abdominal pain  but had reported chronic tenderness for about 50 years.  Once daily that usually helps prevent significant vomiting.  Admitted to taking Mucinex and Coricidin to help breathing and congestion.  Taking dicyclomine as needed for stomach pain as well as her PPI once daily. Scheduled for EGD and colonoscopy with Dr. Jena Gauss.  PPI increased to twice daily.  Requested prior colonoscopy records from [place].  Labs ordered including H/H and anemia panel.  Multiple attempts were made to reach out to patient to schedule EGD and colonoscopy.  Patient did not answer phone calls and letter was mailed in July.  Labs 01/13/23: Hemoglobin 10.2, iron saturation 5%, iron 20, ferritin 39.  Advised patient she needed to increase her iron tablet to daily.  Today: Can only eat small amounts before she feels full. Eats breakfast and then a sandwich. For supper she wants something greens and a  potatoes, sometimes cornbread. Sometimes will eat chicken or ham. Taking megace twice daily. - it does make her hungry. Uses zofran once daily for her nausea.   Uses the dicyclomine about twice daily for her abdominal pain. Is on hydrocodone 10 mg QID for pain as well.   Stools have been hard and she is taking a stool softener and that is working. Having a daily BM.   She states Dr. Sherene Sires said her COPD is not that bad. Using 2L of O2 usually. At home she doesn't need it all the time. Occasionally medication gets stuck when swallowing in her mid chest and that at  times will cause her some chest discomfort.    Wt Readings from Last 3 Encounters:  03/06/23 99 lb 3.2 oz (45 kg)  01/13/23 112 lb 6.4 oz (51 kg)  12/10/22 112 lb 12.8 oz (51.2 kg)   Current Outpatient Medications  Medication Sig Dispense Refill   acetaminophen (TYLENOL) 650 MG CR tablet Take 650 mg by mouth every 8 (eight) hours as needed for pain.     albuterol (PROVENTIL) (2.5 MG/3ML) 0.083% nebulizer solution Take 3 mLs (2.5 mg total) by nebulization every 4 (four)  hours as needed for wheezing or shortness of breath. 1080 mL 3   albuterol (VENTOLIN HFA) 108 (90 Base) MCG/ACT inhaler Inhale 2 puffs into the lungs every 4 (four) hours as needed for wheezing or shortness of breath. for wheezing 18 g 1   amLODipine (NORVASC) 5 MG tablet Take 5 mg by mouth daily.     baclofen (LIORESAL) 10 MG tablet Take 10 mg by mouth 2 (two) times daily.     BREZTRI AEROSPHERE 160-9-4.8 MCG/ACT AERO Inhale 2 puffs into the lungs 2 (two) times daily.     budesonide-formoterol (SYMBICORT) 160-4.5 MCG/ACT inhaler Inhale 2 puffs into the lungs in the morning and at bedtime. 10.2 g 6   buprenorphine (BUTRANS) 10 MCG/HR PTWK 1 patch once a week.     CAPLYTA 42 MG capsule Take 42 mg by mouth daily.     cyclobenzaprine (FLEXERIL) 10 MG tablet Take 10 mg by mouth 2 (two) times daily.     dicyclomine (BENTYL) 20 MG tablet Take 20 mg by mouth 4 (four) times daily.     famotidine (PEPCID) 20 MG tablet TAKE 1 TABLET DAILY AFTER SUPPER 90 tablet 3   fluticasone (FLONASE) 50 MCG/ACT nasal spray Place 2 sprays into both nostrils daily.     HYDROcodone-acetaminophen (NORCO) 10-325 MG tablet Take 1 tablet by mouth 3 (three) times daily as needed for pain.     hydrOXYzine (ATARAX) 10 MG tablet Take 10 mg by mouth 2 (two) times daily as needed.     losartan (COZAAR) 50 MG tablet Take 50 mg by mouth every morning.     megestrol (MEGACE) 20 MG tablet Take 20 mg by mouth daily.     metFORMIN (GLUCOPHAGE) 500 MG tablet Take 500 mg by mouth 2 (two) times daily with a meal.     naloxone (NARCAN) nasal spray 4 mg/0.1 mL Place 0.4 mg into the nose once.     nitroGLYCERIN (NITROSTAT) 0.4 MG SL tablet Place 0.4 mg under the tongue every 5 (five) minutes as needed for chest pain.     omeprazole (PRILOSEC) 40 MG capsule TAKE 1 CAPSULE 30 TO 60 MINUTES BEFORE FIRST MEAL OF THE DAY 90 capsule 3   ondansetron (ZOFRAN) 4 MG tablet Take 4 mg by mouth daily.     pregabalin (LYRICA) 25 MG capsule Take 25 mg by  mouth 2 (two) times daily.     rosuvastatin (CRESTOR) 20 MG tablet Take 20 mg by mouth at bedtime.     sertraline (ZOLOFT) 100 MG tablet Take 100 mg by mouth every morning.     Tiotropium Bromide Monohydrate (SPIRIVA RESPIMAT) 2.5 MCG/ACT AERS Inhale 2 each into the lungs daily. 2 puffs daily     No current facility-administered medications for this visit.    Past Medical History:  Diagnosis Date   Anxiety    Arthritis    Bipolar affective disorder (HCC)    CAD (coronary artery disease)  Chronic respiratory failure (HCC)    Complication of anesthesia    COPD (chronic obstructive pulmonary disease) (HCC)    Depression    DM (diabetes mellitus) (HCC)    GERD (gastroesophageal reflux disease)    HTN (hypertension)    Polymyalgia rheumatica (HCC)    PONV (postoperative nausea and vomiting)    Stroke [facility])     Past Surgical History:  Procedure Laterality Date   ABDOMINAL HYSTERECTOMY N/A    ANTERIOR FUSION CERVICAL SPINE  2012   C5-7   BACK SURGERY     BIOPSY  03/13/2022   Procedure: BIOPSY;  Surgeon: Corbin Ade, MD;  [facility]: [facility];  Service: Endoscopy;;   ESOPHAGOGASTRODUODENOSCOPY (EGD) WITH PROPOFOL N/A 03/13/2022   Procedure: ESOPHAGOGASTRODUODENOSCOPY (EGD) WITH PROPOFOL;  Surgeon: Corbin Ade, MD;  Location: [facility];  Service: Endoscopy;  Laterality: N/A;  12:30pm   MALONEY DILATION N/A 03/13/2022   Procedure: MALONEY DILATION;  Surgeon: Corbin Ade, MD;  Location: [facility];  Service: Endoscopy;  Laterality: N/A;   SHOULDER SURGERY      Family History  Problem Relation Age of Onset   Hypertension Mother    Breast cancer Sister    Colon cancer Neg Hx     Allergies as of 03/06/2023 - Review Complete 03/06/2023  Allergen Reaction Noted   Alendronate Other (See Comments) 02/08/2014   Ibuprofen Nausea And Vomiting 11/04/2013   Ramipril Nausea And Vomiting and Other (See Comments) 11/04/2013   Sulfabenzamide Other (See Comments)  11/04/2013    Social History   Socioeconomic History   Marital status: Single    Spouse name: Not on file   Number of children: Not on file   Years of education: Not on file   Highest education level: Not on file  Occupational History   Not on file  Tobacco Use   Smoking status: Every Day    Current packs/day: 0.50    Average packs/day: 0.5 packs/day for 50.0 years (25.0 ttl pk-yrs)    Types: Cigarettes   Smokeless tobacco: Never  Vaping Use   Vaping status: Never Used  Substance and Sexual Activity   Alcohol use: Never   Drug use: Never   Sexual activity: Not on file  Other Topics Concern   Not on file  Social History Narrative   Not on file   Social Determinants of Health   Financial Resource Strain: Not on file  Food Insecurity: Not on file  Transportation Needs: Not on file  Physical Activity: Not on file  Stress: Not on file  Social Connections: Not on file     Review of Systems   Gen: Denies fever, chills, anorexia. Denies fatigue, weakness, weight loss.  CV: Denies chest pain, palpitations, syncope, peripheral edema, and claudication. Resp: Denies dyspnea at rest, cough, wheezing, coughing up blood, and pleurisy. GI: See HPI Derm: Denies rash, itching, dry skin Psych: Denies depression, anxiety, memory loss, confusion. No homicidal or suicidal ideation.  Heme: Denies bruising, bleeding, and enlarged lymph nodes.   Physical Exam   BP 114/72 (BP Location: Right Arm, Patient Position: Sitting, Cuff Size: Normal)   Pulse (!) 105   Temp 98.8 F (37.1 C) (Temporal)   Ht 5\' 1"  (1.549 m)   Wt 99 lb 3.2 oz (45 kg)   SpO2 94%   BMI 18.74 kg/m   General:   Alert and oriented. No distress noted. Pleasant and cooperative.  Head:  Normocephalic and atraumatic. Eyes:  Conjuctiva clear without  scleral icterus. Mouth:  Oral mucosa pink and moist. Good dentition. No lesions. Lungs:  Clear to auscultation bilaterally. No wheezes, rales, or rhonchi. No distress.   Heart:  S1, S2 present without murmurs appreciated.  Abdomen:  +BS, soft, non-tender and non-distended. No rebound or guarding. No HSM or masses noted. Rectal: deferred Msk:  Symmetrical without gross deformities. Normal posture. Extremities:  Without edema. Neurologic:  Alert and  oriented x4 Psych:  Alert and cooperative. Normal mood and affect.   Assessment  Candace Harris is a 72 y.o. female with a history of COPD on home oxygen, GERD, DM, HTN, Stroke, anxiety/depression, CAD, and polymyalgia rheumatica  presenting today for follow-up on anemia and need to reschedule procedures.  Iron deficiency anemia: History of anemia in early 2022 and hemoglobin normalized in September 2022.  Hemoglobin dropped to 9.6 in August 2023 and decreased to 7.7 in May 2024.  She has been taking iron daily since her last visit and her most recent hemoglobin the beginning of July was up to 10.2.  She does report some dark stools related to her iron however denies any overt melena or BRBPR.  We had planned to schedule EGD and colonoscopy for further evaluation at her last office visit in May however following were unable to reach patient to get scheduled for procedures after receiving clearance from pulmonology.  She continues to deny any frequent NSAID use or alcohol.  She does still smoke.  Last EGD was in August 2023 that was normal.  No colonoscopy reports from [facility] to have been received although it appears she last had a colonoscopy.  Etiology improving however is experiencing iron deficiency in the setting of decreased p.o. intake and potential GI source.  We are performing procedures to rule out malignancy, esophagitis, gastritis, duodenitis, PUD, and AVMs.   GERD, dysphagia, chronic epigastric pain, nausea/vomiting: Omeprazole 40 mg twice daily and famotidine nightly which seems to be controlling her actual reflux symptoms.  She does continue to have some mild dysphagia with medications and occasional chest  discomfort related to this.  She does have chronic epigastric pain for almost 50 years which is controlled with dicyclomine 1-2 times daily as needed.  She continues to experience daily nausea as well but no significant vomiting.  Zofran appears to control her nausea.  Proceeding with EGD to evaluate for anemia and will perform dilation if needed given dysphagia/globus sensation with medications.  Constipation: Previously secondary to iron intake as well as chronic opioids.  Has been taking stool softener and having soft almost daily bowel movements.  Weight loss, lack of appetite, early satiety: Per review of patient's weights she has fluctuated her weight between 105-115 pounds up until today where she is now 99 pounds, was previously 112 pounds at her last visit in May 2024.  She states she usually has a poor appetite although Megace is helping with this.  Despite her good appetite she has pretty significant early satiety and after small amounts of meals she gets full and if she overeats that will make her vomit.  As stated above she has had an unremarkable upper endoscopy in August 2023 however given her significant weight loss and poor appetite we should reevaluate.  She does have chronic abdominal pain but this is not worsened after eating.  It is possible that she could have some mild mesenteric ischemia therefore if EGD is unrevealing we will proceed with CT scan of the abdomen and pelvis.  Prior labs in May with normal TSH.  PLAN   Proceed with colonoscopy and upper endoscopy with possible dilation with propofol by Dr. Marletta Lor in near future: the risks, benefits, and alternatives have been discussed with the patient in detail. The patient states understanding and desires to proceed.  ASA 3 (needs ASAP and given no availability with Rourk until September we will schedule with Dr. Marletta Lor) Low volume prep Miralax daily for 4 days prior. Hold iron for 7 days Can take Zofran every 8 hours as needed  for nausea while drinking prep High-protein/high-calorie diet, supplement with Ensure protein shakes 2 times daily between regular meals. If EGD unrevealing will plan for CT of the abdomen and pelvis Continue omeprazole 40 mg twice daily Continue Megace 20 mg daily Continue daily stool softener Continue Zofran daily as needed Follow-up in 2 months    Brooke Bonito, MSN, FNP-BC, AGACNP-BC [facility]

## 2023-03-06 ENCOUNTER — Encounter: Payer: Self-pay | Admitting: Gastroenterology

## 2023-03-06 ENCOUNTER — Telehealth: Payer: Self-pay | Admitting: *Deleted

## 2023-03-06 ENCOUNTER — Ambulatory Visit (INDEPENDENT_AMBULATORY_CARE_PROVIDER_SITE_OTHER): Payer: Self-pay | Admitting: Gastroenterology

## 2023-03-06 VITALS — BP 114/72 | HR 105 | Temp 98.8°F | Ht 61.0 in | Wt 99.2 lb

## 2023-03-06 DIAGNOSIS — G8929 Other chronic pain: Secondary | ICD-10-CM

## 2023-03-06 DIAGNOSIS — R1013 Epigastric pain: Secondary | ICD-10-CM

## 2023-03-06 DIAGNOSIS — D509 Iron deficiency anemia, unspecified: Secondary | ICD-10-CM

## 2023-03-06 DIAGNOSIS — R1310 Dysphagia, unspecified: Secondary | ICD-10-CM

## 2023-03-06 DIAGNOSIS — R112 Nausea with vomiting, unspecified: Secondary | ICD-10-CM

## 2023-03-06 DIAGNOSIS — K219 Gastro-esophageal reflux disease without esophagitis: Secondary | ICD-10-CM

## 2023-03-06 DIAGNOSIS — R6881 Early satiety: Secondary | ICD-10-CM

## 2023-03-06 DIAGNOSIS — R634 Abnormal weight loss: Secondary | ICD-10-CM

## 2023-03-06 NOTE — Telephone Encounter (Signed)
Called pt, no answer and no VM Needs to scheduled TCS/EGD/ED with Dr. Marletta Lor, ASA 3 Low volume prep suprep or clenpiq Miralax 17g daily x 4 days prior to prep Zofran every 8 hours as needed during prep Hold iron x 1 week

## 2023-03-06 NOTE — Patient Instructions (Addendum)
I will send in prescription for you for Ensure drinks twice daily.  You should drink 1 between breakfast and lunch and 1 between lunch and dinner.  Continue omeprazole 40 mg twice daily as well as your Megace 20 mg daily.  Follow a high-protein/high-calorie diet as able.  Education attached for you today.  We will get you scheduled with Dr. Marletta Lor for upper endoscopy and colonoscopy given Dr. Jena Gauss does not have any availability until October.  Continue your daily stool softener.  If you begin to experience any hard stools or you begin going more than 2 days without a bowel movement please start taking MiraLAX 1 capful daily.  I would like to see you in 2 months, this will be shortly after your procedures.  Please let me know if you have any further weight loss between now and your follow-up as I will send in a nutrition consult.  It was a pleasure to see you today. I want to create trusting relationships with patients. If you receive a survey regarding your visit,  I greatly appreciate you taking time to fill this out on paper or through your MyChart. I value your feedback.  Brooke Bonito, MSN, FNP-BC, AGACNP-BC [facility]

## 2023-03-12 NOTE — Telephone Encounter (Signed)
Dr. Marletta Lor is not full. Will have to call with Oct schedule

## 2023-03-31 ENCOUNTER — Other Ambulatory Visit: Payer: Self-pay

## 2023-03-31 MED ORDER — FAMOTIDINE 20 MG PO TABS: ORAL_TABLET | ORAL | 3 refills | Status: DC

## 2023-04-01 ENCOUNTER — Encounter: Payer: Self-pay | Admitting: *Deleted

## 2023-04-01 ENCOUNTER — Other Ambulatory Visit: Payer: Self-pay | Admitting: *Deleted

## 2023-04-01 MED ORDER — NA SULFATE-K SULFATE-MG SULF 17.5-3.13-1.6 GM/177ML PO SOLN: ORAL | 0 refills | Status: DC

## 2023-04-02 ENCOUNTER — Encounter: Payer: Self-pay | Admitting: Gastroenterology

## 2023-04-09 ENCOUNTER — Ambulatory Visit: Payer: Medicare HMO | Admitting: Orthopedic Surgery

## 2023-04-09 NOTE — Progress Notes (Deleted)
Candace Harris, female    DOB: Sep 11, 1950    MRN: [id number]   Brief patient profile:  71 yobf active smoker with GOLD I COPD by pfts 02/2019 transferred care to [facility] office 11/02/2020 p admit (see below)   From Dr Chestine Spore 11/24/19 Elderly BF with a history of tobacco abuse and COPD who presents for evaluation of dyspnea on exertion.   She was diagnosed with COPD about 2 years ago but has had progressively worsening shortness of breath over the last 2 years.  She also has cough, sputum production, wheezing, but dyspnea on exertion is her main complaint.  Her activity is fairly limited; she is only able to walk a few steps around her house without stopping.   She is on 3 L supplemental oxygen at home.  She was prescribed 2.5 L but had ongoing dyspnea on titrated up to 3 L.  Her home saturations are usually in the 90s, but sometimes drop into the 80s.  She is currently using her Trelegy inhaler daily with frequent albuterol.  She continues to smoke 0.25- 0.5 packs/day; she has smoked 0.5 ppd for the last 50 years.     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  Rec: trelegy Check echo: 12/15/19  G I diastolic dysfunction / no cor pulmonale    Stopped trelegy  mid Jan 2022 due irritated mouth   Admit date: 08/15/2020 Discharge date: 08/18/2020 Brief Hospitalization Summary: Please see all hospital notes, images, labs for full details of the hospitalization. ADMISSION HPI: Candace Harris is a 72 y.o. female with medical history significant for HTN, CAD, anxiety, COPD (on 3-6 L of O2 at home), T2DM, GERD and bipolar disorder who presents to the emergency department due to 42-month onset of shortness of breath that has been progressively worsening, she complained of decreased oral intake due to loss of taste, she also endorsed loss of smell and she states that she had diarrhea for 2 to 3 weeks last month which has resolved.  She states that she saw her PCP in [place] who prescribed prednisone  for her, but she has not yet filled the prescription.  She decided to go to the ED for further evaluation due to persistent worsening shortness of breath and concern for dehydration.   ED Course: In the emergency department, she was tachycardic and intermittently tachypneic. Work-up in the ED showed leukocytosis, thrombocytosis, hypokalemia, hyponatremia, BUN/creatinine 41/1.70 (baseline creatinine was 0.7). D-dimer 0.78. Chest x-ray was reflective of emphysema but showed no active cardiopulmonary disease Breathing treatment was provided, Solu-Medrol 125 Mg x1 was given, IV Ativan Librium x1 due to anxiety was given and patient was provided with 500 mL of IV NS. Hospitalist was asked to admit patient for further evaluation and management.   Hospital Course    Acute on chronic respiratory failure with hypoxia - secondary to COPD exacerbation.  Pt much improved after IV steroids, bronchodilators and supportive measures. .  Prednisone taper.   Hypomagnesemia - IV replacement given and repleted.  Hypokalemia - repleted.  Essential hypertension - continue home meds GERD - protonix for GI protection.  Type 2 DM - monitored and stable.   AKI - resolved after hydration.    Discharge Diagnoses:  Principal Problem:   Shortness of breath Active Problems:   AKI (acute kidney injury) (HCC)   Dehydration   Leukocytosis   Thrombocytosis   Essential hypertension   Elevated d-dimer   Hypokalemia   Anxiety   COPD  with acute exacerbation (HCC)   Hyperlipidemia   Diabetes mellitus (HCC)   GERD (gastroesophageal reflux disease)   CAD (coronary artery disease)        History of Present Illness  11/02/2020  Pulmonary/ 1st office eval/ Arian Mcquitty / [facility] GOLD I copd with component of UACS/ anxiety  Chief Complaint  Patient presents with   Follow-up    Productive cough with white phlegm since January 2022  Dyspnea:  Can do 17 steps at appt complex but has to stop at top to recover / last  shopping x 2 y prior to OV   Cough: hocking min white daytime off gerd rx  Sleep: on side flat bed s resp symptoms "as long as I have on my oxygen"  SABA use: avg 6-8 x per day / no maint rx  02 2lpm 24/7 rec Plan A = Automatic = Always=    Breztri Take 2 puffs first thing in am and then another 2 puffs about 12 hours later.  Work on inhaler technique: Plan B = Backup (to supplement plan A, not to replace it) Only use your albuterol inhaler as a rescue medication Try prilosec 40mg   Take 30-60 min before first meal of the day and Pepcid ac (famotidine) 20 mg one after supper  Until return  GERD diet  Please schedule a follow up office visit in 6 weeks, call sooner if needed with all medications /inhalers/ solutions in hand so we can verify exactly what you are taking. This includes all medications from all doctors and over the counters         01/13/2023  f/u ov/[place] office/Shaunika Italiano re: GOLD 1 copd  and anemia maint on symbicort 160/spiriva  did not  bring meds except hfa / still smoking  Chief Complaint  Patient presents with   Follow-up   Dyspnea:  same as last ov/ anemia has not been corrected but w/u in progress  Cough: varies min mucoid  Sleeping: flat bed 3 pillows  SABA use: p exercise only 02: 2.5 to 3lpm not titrating  Rec Plan A = Automatic = Always=    symbicort 160 and spiriva 2 puffs of each 1st thing in am and then another 2 puffs symbicort  12 hours later Work on inhaler technique:  Plan B = Backup (to supplement plan A, not to replace it) Only use your albuterol inhaler as a rescue medication Plan C = Crisis (instead of Plan B but only if Plan B stops working) - only use your albuterol nebulizer if you first try Plan B  Also  Ok to try albuterol 15 min before an activity (on alternating days between your inhaler/ nebulizer)  that you know would usually make you short of breath     Make sure you check your oxygen saturation  AT  your highest level of activity (not after  you stop)   to be sure it stays over 90%   Please schedule a follow up office visit in 3 months call sooner if needed with all medications /inhalers/ solutions in hand    04/10/2023  f/u ov/[place] office/Myca Perno re: GOLD 1 copd/ 02 dep/ anemia  maint on *** did *** bring meds  No chief complaint on file.   Dyspnea:  *** Cough: *** Sleeping: ***   resp cc  SABA use: *** 02: ***  Lung cancer screening: ***   No obvious day to day or daytime variability or assoc excess/ purulent sputum or mucus plugs or hemoptysis or  cp or chest tightness, subjective wheeze or overt sinus or hb symptoms.    Also denies any obvious fluctuation of symptoms with weather or environmental changes or other aggravating or alleviating factors except as outlined above   No unusual exposure hx or h/o childhood pna/ asthma or knowledge of premature birth.  Current Allergies, Complete Past Medical History, Past Surgical History, Family History, and Social History were reviewed in [employer] record.  ROS  The following are not active complaints unless bolded Hoarseness, sore throat, dysphagia, dental problems, itching, sneezing,  nasal congestion or discharge of excess mucus or purulent secretions, ear ache,   fever, chills, sweats, unintended wt loss or wt gain, classically pleuritic or exertional cp,  orthopnea pnd or arm/hand swelling  or leg swelling, presyncope, palpitations, abdominal pain, anorexia, nausea, vomiting, diarrhea  or change in bowel habits or change in bladder habits, change in stools or change in urine, dysuria, hematuria,  rash, arthralgias, visual complaints, headache, numbness, weakness or ataxia or problems with walking or coordination,  change in mood or  memory.        No outpatient medications have been marked as taking for the 04/10/23 encounter (Appointment) with Nyoka Cowden, MD.              Past Medical History:  Diagnosis Date   Anxiety    Bipolar  affective disorder [facility])    CAD (coronary artery disease)    Chronic respiratory failure (HCC)    COPD (chronic obstructive pulmonary disease) (HCC)    DM (diabetes mellitus) (HCC)    GERD (gastroesophageal reflux disease)    HTN (hypertension)    Polymyalgia rheumatica (HCC)           Objective:    Wt  04/10/2023         ***  01/13/2023           112  12/02/2022        110  05/30/2022      107  04/23/2022      104  03/25/2022        103  09/24/2021        107 03/15/2021         107  12/14/20 107 lb 12.8 oz (48.9 kg)  11/02/20 112 lb 12.8 oz (51.2 kg)  08/15/20 132 lb 4.4 oz (60 kg)    Vital signs reviewed  04/10/2023  - Note at rest 02 sats  ***% on ***   General appearance:    ***     Mild barr***         Lab Results  Component Value Date   HGB 8.3 (L) 12/10/2022   HGB 7.7 (L) 12/02/2022   HGB 9.6 (L) 03/08/2022   HGB 11.7 03/15/2021           Assessment

## 2023-04-10 ENCOUNTER — Ambulatory Visit: Payer: Medicare HMO | Admitting: Internal Medicine

## 2023-04-23 ENCOUNTER — Telehealth: Payer: Self-pay | Admitting: Internal Medicine

## 2023-04-23 NOTE — Telephone Encounter (Signed)
Patient called wanting to schedule an appointment.  I looked in the last visit office notes and Toni Amend did want patient to return in 2 months.  She is scheduled for a procedure 10/25 and I explained to her that after that procedure Dr. Marletta Lor would want to see her for followup and she would have 2 appointments or we could wait until after the procedure.  Paitent wanted to wait until after procedure and then come in for follow up.

## 2023-04-30 ENCOUNTER — Emergency Department (HOSPITAL_COMMUNITY)
Admission: EM | Admit: 2023-04-30 | Discharge: 2023-04-30 | Disposition: A | Discharge status: Home / Self Care | Payer: Medicare HMO | Attending: Emergency Medicine | Admitting: Emergency Medicine

## 2023-04-30 ENCOUNTER — Ambulatory Visit: Payer: Medicare HMO | Admitting: Internal Medicine

## 2023-04-30 ENCOUNTER — Emergency Department (HOSPITAL_COMMUNITY): Payer: Medicare HMO

## 2023-04-30 DIAGNOSIS — R630 Anorexia: Secondary | ICD-10-CM | POA: Insufficient documentation

## 2023-04-30 DIAGNOSIS — Z79899 Other long term (current) drug therapy: Secondary | ICD-10-CM | POA: Insufficient documentation

## 2023-04-30 DIAGNOSIS — Z7984 Long term (current) use of oral hypoglycemic drugs: Secondary | ICD-10-CM | POA: Diagnosis not present

## 2023-04-30 DIAGNOSIS — I10 Essential (primary) hypertension: Secondary | ICD-10-CM | POA: Insufficient documentation

## 2023-04-30 DIAGNOSIS — I2510 Atherosclerotic heart disease of native coronary artery without angina pectoris: Secondary | ICD-10-CM | POA: Insufficient documentation

## 2023-04-30 DIAGNOSIS — J449 Chronic obstructive pulmonary disease, unspecified: Secondary | ICD-10-CM | POA: Insufficient documentation

## 2023-04-30 DIAGNOSIS — E119 Type 2 diabetes mellitus without complications: Secondary | ICD-10-CM | POA: Diagnosis not present

## 2023-04-30 DIAGNOSIS — R634 Abnormal weight loss: Secondary | ICD-10-CM | POA: Insufficient documentation

## 2023-04-30 DIAGNOSIS — R531 Weakness: Secondary | ICD-10-CM | POA: Insufficient documentation

## 2023-04-30 DIAGNOSIS — R5383 Other fatigue: Secondary | ICD-10-CM | POA: Insufficient documentation

## 2023-04-30 LAB — CBC WITH DIFFERENTIAL/PLATELET
Abs Immature Granulocytes: 0.05 10*3/uL (ref 0.00–0.07)
Basophils Absolute: 0 10*3/uL (ref 0.0–0.1)
Basophils Relative: 0 %
Eosinophils Absolute: 0 10*3/uL (ref 0.0–0.5)
Eosinophils Relative: 0 %
HCT: 38.3 % (ref 36.0–46.0)
Hemoglobin: 12.3 g/dL (ref 12.0–15.0)
Immature Granulocytes: 0 %
Lymphocytes Relative: 9 %
Lymphs Abs: 1.4 10*3/uL (ref 0.7–4.0)
MCH: 27.3 pg (ref 26.0–34.0)
MCHC: 32.1 g/dL (ref 30.0–36.0)
MCV: 84.9 fL (ref 80.0–100.0)
Monocytes Absolute: 0.8 10*3/uL (ref 0.1–1.0)
Monocytes Relative: 6 %
Neutro Abs: 12.3 10*3/uL — ABNORMAL HIGH (ref 1.7–7.7)
Neutrophils Relative %: 85 %
Platelets: 311 10*3/uL (ref 150–400)
RBC: 4.51 MIL/uL (ref 3.87–5.11)
RDW: 17.9 % — ABNORMAL HIGH (ref 11.5–15.5)
WBC: 14.6 10*3/uL — ABNORMAL HIGH (ref 4.0–10.5)
nRBC: 0 % (ref 0.0–0.2)

## 2023-04-30 LAB — BASIC METABOLIC PANEL
Anion gap: 12 (ref 5–15)
BUN: 5 mg/dL — ABNORMAL LOW (ref 8–23)
CO2: 28 mmol/L (ref 22–32)
Calcium: 9.8 mg/dL (ref 8.9–10.3)
Chloride: 100 mmol/L (ref 98–111)
Creatinine, Ser: 0.56 mg/dL (ref 0.44–1.00)
GFR, Estimated: >60 mL/min (ref >60)
Glucose, Bld: 113 mg/dL — ABNORMAL HIGH (ref 70–99)
Potassium: 3.9 mmol/L (ref 3.5–5.1)
Sodium: 140 mmol/L (ref 135–145)

## 2023-04-30 LAB — CBG MONITORING, ED: Glucose-Capillary: 115 mg/dL — ABNORMAL HIGH (ref 70–99)

## 2023-04-30 NOTE — Discharge Instructions (Signed)
Your lab tests, chest x-ray and exam today are reassuring, however you do need to proceed with the endoscopy and the colonoscopy which has been scheduled for you next week.  In the meantime I do encourage you to continue drinking Ensure to help get your nutrition until Dr. Marletta Lor can figure out why you are having difficulty with solid foods.  Make sure you are drinking plenty of fluids as well to avoid dehydration, recommend water and juices rather than caffeinated beverages, ice tea is not a particularly hydrating fluids since it contains caffeine.  Complete the prednisone and other medicines you were prescribed yesterday for your facial swelling.

## 2023-04-30 NOTE — ED Triage Notes (Signed)
Pt c/o generalized itching and "bumps"  since May.  Pt reports being seen by a tele-MD  yesterday and prescribed "a steroid and Zytrec."    Pt denies new medication, lotions, soaps, etc.

## 2023-04-30 NOTE — ED Provider Notes (Signed)
[place] EMERGENCY DEPARTMENT AT [facility] Provider Note   CSN: [id number] Arrival date & time: 04/30/23  1132     History  Chief Complaint  Patient presents with   Allergic Reaction    Candace Harris is a 72 y.o. female with a history including hypertension, GERD, COPD on chronic oxygen at 2 L, type 2 diabetes, history of CAD and polymyalgia rheumatica presenting for evaluation of generalized fatigue and weight loss.  She also states that she has had some lip and tongue swelling, stating 2 days ago her tongue was swollen and then yesterday she woke with both of her lips significantly swollen.  She was seen by telemedicine MD and was placed on a prednisone taper, Zyrtec and Pepcid, since then her lip swelling has improved, she still states her tongue feels moderately swollen.  She denies having any shortness of breath, no wheezing during these episodes.  She has had no changes in her medications prior to onset of the symptoms.  No recognized new exposures to soaps or lotions, other topical products.  She also has concerns about a significant generalized weakness and weight loss with loss of appetite which she has noticed since May.  She reports a significant perceived weight loss, she has always weighed 110 pounds, has had poor appetite, describes regurgitating food very frequently although has been able to tolerate fluids without regurgitation.  She denies abdominal pain constipation or diarrhea.  No dysuria, endorses increased thirst but not necessarily increased urinary frequency.  The history is provided by the patient.       Home Medications Prior to Admission medications   Medication Sig Start Date End Date Taking? Authorizing Provider  acetaminophen (TYLENOL) 650 MG CR tablet Take 650 mg by mouth every 8 (eight) hours as needed for pain.    [provider]  albuterol (PROVENTIL) (2.5 MG/3ML) 0.083% nebulizer solution Take 3 mLs (2.5 mg total) by nebulization  every 4 (four) hours as needed for wheezing or shortness of breath. 04/15/22   Nyoka Cowden, MD  albuterol (VENTOLIN HFA) 108 (90 Base) MCG/ACT inhaler Inhale 2 puffs into the lungs every 4 (four) hours as needed for wheezing or shortness of breath. for wheezing 08/30/22   Nyoka Cowden, MD  amLODipine (NORVASC) 5 MG tablet Take 5 mg by mouth daily. 12/19/16   [provider]  baclofen (LIORESAL) 10 MG tablet Take 10 mg by mouth 2 (two) times daily. 01/27/23   [provider]  BREZTRI AEROSPHERE 160-9-4.8 MCG/ACT AERO Inhale 2 puffs into the lungs 2 (two) times daily. 11/25/22   [provider]  budesonide-formoterol (SYMBICORT) 160-4.5 MCG/ACT inhaler Inhale 2 puffs into the lungs in the morning and at bedtime. 09/02/22   Nyoka Cowden, MD  buprenorphine (BUTRANS) 10 MCG/HR PTWK 1 patch once a week. 12/04/22   [provider]  CAPLYTA 42 MG capsule Take 42 mg by mouth daily. 02/07/23   [provider]  cyclobenzaprine (FLEXERIL) 10 MG tablet Take 10 mg by mouth 2 (two) times daily. 06/17/20   [provider]  dicyclomine (BENTYL) 20 MG tablet Take 20 mg by mouth 4 (four) times daily. 06/17/20   [provider]  famotidine (PEPCID) 20 MG tablet TAKE 1 TABLET DAILY AFTER SUPPER 03/31/23   Nyoka Cowden, MD  fluticasone (FLONASE) 50 MCG/ACT nasal spray Place 2 sprays into both nostrils daily.    [provider]  HYDROcodone-acetaminophen (NORCO) 10-325 MG tablet Take 1 tablet by mouth 3 (  three) times daily as needed for pain. 01/25/22   [provider]  hydrOXYzine (ATARAX) 10 MG tablet Take 10 mg by mouth 2 (two) times daily as needed. 03/03/23   [provider]  losartan (COZAAR) 50 MG tablet Take 50 mg by mouth every morning. 02/21/23   [provider]  megestrol (MEGACE) 20 MG tablet Take 20 mg by mouth daily.    [provider]  metFORMIN (GLUCOPHAGE) 500 MG tablet Take 500 mg by mouth 2 (two)  times daily with a meal. 08/13/16   [provider]  Na Sulfate-K Sulfate-Mg Sulf 17.5-3.13-1.6 GM/177ML SOLN As directed 04/01/23   Lanelle Bal, DO  naloxone [facility]) nasal spray 4 mg/0.1 mL Place 0.4 mg into the nose once.    [provider]  nitroGLYCERIN (NITROSTAT) 0.4 MG SL tablet Place 0.4 mg under the tongue every 5 (five) minutes as needed for chest pain. 06/17/20   [provider]  omeprazole (PRILOSEC) 40 MG capsule TAKE 1 CAPSULE 30 TO 60 MINUTES BEFORE FIRST MEAL OF THE DAY 04/15/22   Nyoka Cowden, MD  ondansetron (ZOFRAN) 4 MG tablet Take 4 mg by mouth daily. 11/11/19   [provider]  pregabalin (LYRICA) 25 MG capsule Take 25 mg by mouth 2 (two) times daily. 02/27/23   [provider]  rosuvastatin (CRESTOR) 20 MG tablet Take 20 mg by mouth at bedtime. 06/12/20   [provider]  sertraline (ZOLOFT) 100 MG tablet Take 100 mg by mouth every morning. 02/21/23   [provider]  Tiotropium Bromide Monohydrate (SPIRIVA RESPIMAT) 2.5 MCG/ACT AERS Inhale 2 each into the lungs daily. 2 puffs daily    [provider]      Allergies    Alendronate, Ibuprofen, Ramipril, and Sulfabenzamide    Review of Systems   Review of Systems  Constitutional:  Positive for appetite change, fatigue and unexpected weight change. Negative for fever.  HENT:  Positive for facial swelling and trouble swallowing. Negative for congestion and sore throat.   Eyes: Negative.   Respiratory:  Positive for shortness of breath. Negative for chest tightness, wheezing and stridor.        Reports her shortness of breath has been baseline for her.  Cardiovascular:  Negative for chest pain, palpitations and leg swelling.  Gastrointestinal:  Negative for abdominal pain and nausea.  Genitourinary: Negative.   Musculoskeletal:  Negative for arthralgias, joint swelling and neck pain.  Skin: Negative.  Negative for rash and wound.  Neurological:   Negative for dizziness, weakness, light-headedness, numbness and headaches.  Psychiatric/Behavioral: Negative.      Physical Exam Updated Vital Signs BP 122/86   Pulse 95   Temp 98.1 F (36.7 C) (Oral)   Resp 18   Ht 5\' 1"  (1.549 m)   Wt 44.9 kg   SpO2 100%   BMI 18.71 kg/m  Physical Exam Vitals and nursing note reviewed.  Constitutional:      Appearance: She is well-developed. She is cachectic.  HENT:     Head: Normocephalic and atraumatic.     Comments: Patient has no facial, mouth or tongue edema. Eyes:     Conjunctiva/sclera: Conjunctivae normal.  Cardiovascular:     Rate and Rhythm: Normal rate and regular rhythm.     Heart sounds: Normal heart sounds.  Pulmonary:     Effort: Pulmonary effort is normal.     Breath sounds: Normal breath sounds. No wheezing.  Abdominal:     General: Bowel sounds  are normal.     Palpations: Abdomen is soft.     Tenderness: There is no abdominal tenderness.  Musculoskeletal:        General: Normal range of motion.     Cervical back: Normal range of motion.  Skin:    General: Skin is warm and dry.  Neurological:     Mental Status: She is alert.     ED Results / Procedures / Treatments   Labs (all labs ordered are listed, but only abnormal results are displayed) Labs Reviewed  CBC WITH DIFFERENTIAL/PLATELET - Abnormal; Notable for the following components:      Result Value   WBC 14.6 (*)    RDW 17.9 (*)    Neutro Abs 12.3 (*)    All other components within normal limits  BASIC METABOLIC PANEL - Abnormal; Notable for the following components:   Glucose, Bld 113 (*)    BUN 5 (*)    All other components within normal limits  CBG MONITORING, ED - Abnormal; Notable for the following components:   Glucose-Capillary 115 (*)    All other components within normal limits    EKG None  Radiology DG Chest Portable 1 View  Result Date: 04/30/2023 CLINICAL DATA:  Weakness and weight loss EXAM: PORTABLE CHEST 1 VIEW COMPARISON:   12/02/2022 FINDINGS: Stable cardiomediastinal silhouette. Aortic atherosclerotic calcification. Hyperinflation and chronic interstitial change. No focal consolidation, pleural effusion, or pneumothorax. No displaced rib fractures. IMPRESSION: No acute cardiopulmonary disease.  Emphysema. Electronically Signed   By: Minerva Fester M.D.   On: 04/30/2023 19:31    Procedures Procedures    Medications Ordered in ED Medications - No data to display  ED Course/ Medical Decision Making/ A&P                                 Medical Decision Making Patient presenting with symptoms suggesting angioedema however this was apparently treated yesterday placed on appropriate medications and seems to have responded appropriately.  She has no shortness of breath, no angioedema currently.  She endorses poor appetite, stating she regurgitates food and has had a significant weight loss.  Very concerned about being able to maintain her nutrition.  She has been advised to drink ensures which she tries to do she says they generally stay down but any solid food will be regurgitated.  Review of chart indicates she is actually scheduled to see Dr. Marletta Lor next week for endoscopy colonoscopy and she has had esophageal dilatations in the past.  She has no active symptoms suggesting a food impaction at this time.  She is strongly encouraged to follow-up with Dr. Marletta Lor next week as planned.  Amount and/or Complexity of Data Reviewed Labs: ordered.    Details: CBC, be met obtained, she is not dehydrated her creatinine is 0.56, she does have a leukocytosis at 14.6.  However she is currently on high-dose prednisone. Radiology: ordered.    Details: Chest x-ray reflecting COPD no acute findings.           Final Clinical Impression(s) / ED Diagnoses Final diagnoses:  Weight loss  Anorexia    Rx / DC Orders ED Discharge Orders     None         Victoriano Lain 05/01/23 Chancy Milroy,  MD 05/05/23 1152

## 2023-05-06 ENCOUNTER — Other Ambulatory Visit: Payer: Self-pay | Admitting: Internal Medicine

## 2023-05-06 NOTE — Patient Instructions (Signed)
Candace Harris  05/06/2023     @PREFPERIOPPHARMACY @   Your procedure is scheduled on  05/09/2023.   Report to [facility] at  0700  A.M.   Call this number if you have problems the morning of surgery:  [phone]  If you experience any cold or flu symptoms such as cough, fever, chills, shortness of breath, etc. between now and your scheduled surgery, please notify us at the above number.   Remember:  Follow the diet and prep instructions given to you by the office.    You may drink clear liquids until 0500 am on 05/09/2023.    Clear liquids allowed are:                    Water, Carbonated beverages (diabetics please choose diet or no sugar options), Black Coffee Only (No creamer, milk or cream, including half & half and powdered creamer), and Clear Sports drink (No red color; diabetics please choose diet or no sugar options)    Take these medicines the morning of surgery with A SIP OF WATER          amlodipine, baclofen caplyta, flexeril, hydrocodone(if needed), hydroxyzine, omeprazole, ondansteron(if needed), pregabalin,sertraline.     Do not wear jewelry, make-up or nail polish, including gel polish,  artificial nails, or any other type of covering on natural nails (fingers and  toes).  Do not wear lotions, powders, or perfumes, or deodorant.  Do not shave 48 hours prior to surgery.  Men may shave face and neck.  Do not bring valuables to the hospital.  [facility] is not responsible for any belongings or valuables.  Contacts, dentures or bridgework may not be worn into surgery.  Leave your suitcase in the car.  After surgery it may be brought to your room.  For patients admitted to the hospital, discharge time will be determined by your treatment team.  Patients discharged the day of surgery will not be allowed to drive home and must have someone with them for 24 hours.    Special instructions:   DO NOT smoke tobacco or vape for 24 hours before your  procedure.  Please read over the following fact sheets that you were given. Anesthesia Post-op Instructions and Care and Recovery After Surgery      Upper Endoscopy, Adult, Care After After the procedure, it is common to have a sore throat. It is also common to have: Mild stomach pain or discomfort. Bloating. Nausea. Follow these instructions at home: The instructions below may help you care for yourself at home. Your health care provider may give you more instructions. If you have questions, ask your health care provider. If you were given a sedative during the procedure, it can affect you for several hours. Do not drive or operate machinery until your health care provider says that it is safe. If you will be going home right after the procedure, plan to have a responsible adult: Take you home from the hospital or clinic. You will not be allowed to drive. Care for you for the time you are told. Follow instructions from your health care provider about what you may eat and drink. Return to your normal activities as told by your health care provider. Ask your health care provider what activities are safe for you. Take over-the-counter and prescription medicines only as told by your health care provider. Contact a health care provider if you: Have a sore throat that  lasts longer than one day. Have trouble swallowing. Have a fever. Get help right away if you: Vomit blood or your vomit looks like coffee grounds. Have bloody, black, or tarry stools. Have a very bad sore throat or you cannot swallow. Have difficulty breathing or very bad pain in your chest or abdomen. These symptoms may be an emergency. Get help right away. Call 911. Do not wait to see if the symptoms will go away. Do not drive yourself to the hospital. Summary After the procedure, it is common to have a sore throat, mild stomach discomfort, bloating, and nausea. If you were given a sedative during the procedure, it can  affect you for several hours. Do not drive until your health care provider says that it is safe. Follow instructions from your health care provider about what you may eat and drink. Return to your normal activities as told by your health care provider. This information is not intended to replace advice given to you by your health care provider. Make sure you discuss any questions you have with your health care provider. Document Revised: 10/10/2021 Document Reviewed: 10/10/2021 Elsevier Patient Education  2024 [employer] Esophageal Dilatation Esophageal dilatation, also called esophageal dilation, is a procedure to widen or open a blocked or narrowed part of the esophagus. The esophagus is the part of the body that moves food and liquid from the mouth to the stomach. You may need this procedure if: You have a buildup of scar tissue in your esophagus that makes it difficult, painful, or impossible to swallow. This can be caused by gastroesophageal reflux disease (GERD). You have cancer of the esophagus. There is a problem with how food moves through your esophagus. In some cases, you may need this procedure repeated at a later time to dilate the esophagus gradually. Tell a health care provider about: Any allergies you have. All medicines you are taking, including vitamins, herbs, eye drops, creams, and over-the-counter medicines. Any problems you or family members have had with anesthetic medicines. Any blood disorders you have. Any surgeries you have had. Any medical conditions you have. Any antibiotic medicines you are required to take before dental procedures. Whether you are pregnant or may be pregnant. What are the risks? Generally, this is a safe procedure. However, problems may occur, including: Bleeding due to a tear in the lining of the esophagus. A hole, or perforation, in the esophagus. What happens before the procedure? Ask your health care provider about: Changing or  stopping your regular medicines. This is especially important if you are taking diabetes medicines or blood thinners. Taking medicines such as aspirin and ibuprofen. These medicines can thin your blood. Do not take these medicines unless your health care provider tells you to take them. Taking over-the-counter medicines, vitamins, herbs, and supplements. Follow instructions from your health care provider about eating or drinking restrictions. Plan to have a responsible adult take you home from the hospital or clinic. Plan to have a responsible adult care for you for the time you are told after you leave the hospital or clinic. This is important. What happens during the procedure? You may be given a medicine to help you relax (sedative). A numbing medicine may be sprayed into the back of your throat, or you may gargle the medicine. Your health care provider may perform the dilatation using various surgical instruments, such as: Simple dilators. This instrument is carefully placed in the esophagus to stretch it. Guided wire bougies. This involves using an endoscope to  insert a wire into the esophagus. A dilator is passed over this wire to enlarge the esophagus. Then the wire is removed. Balloon dilators. An endoscope with a small balloon is inserted into the esophagus. The balloon is inflated to stretch the esophagus and open it up. The procedure may vary among health care providers and hospitals. What can I expect after the procedure? Your blood pressure, heart rate, breathing rate, and blood oxygen level will be monitored until you leave the hospital or clinic. Your throat may feel slightly sore and numb. This will get better over time. You will not be allowed to eat or drink until your throat is no longer numb. When you are able to drink, urinate, and sit on the edge of the bed without nausea or dizziness, you may be able to return home. Follow these instructions at home: Take over-the-counter  and prescription medicines only as told by your health care provider. If you were given a sedative during the procedure, it can affect you for several hours. Do not drive or operate machinery until your health care provider says that it is safe. Plan to have a responsible adult care for you for the time you are told. This is important. Follow instructions from your health care provider about any eating or drinking restrictions. Do not use any products that contain nicotine or tobacco, such as cigarettes, e-cigarettes, and chewing tobacco. If you need help quitting, ask your health care provider. Keep all follow-up visits. This is important. Contact a health care provider if: You have a fever. You have pain that is not relieved by medicine. Get help right away if: You have chest pain. You have trouble breathing. You have trouble swallowing. You vomit blood. You have black, tarry, or bloody stools. These symptoms may represent a serious problem that is an emergency. Do not wait to see if the symptoms will go away. Get medical help right away. Call your local emergency services (911 in the [place] Do not drive yourself to the hospital. Summary Esophageal dilatation, also called esophageal dilation, is a procedure to widen or open a blocked or narrowed part of the esophagus. Plan to have a responsible adult take you home from the hospital or clinic. For this procedure, a numbing medicine may be sprayed into the back of your throat, or you may gargle the medicine. Do not drive or operate machinery until your health care provider says that it is safe. This information is not intended to replace advice given to you by your health care provider. Make sure you discuss any questions you have with your health care provider. Document Revised: 11/17/2019 Document Reviewed: 11/17/2019 Elsevier Patient Education  2024 [employer] Colonoscopy, Adult, Care After The following information offers guidance on  how to care for yourself after your procedure. Your health care provider may also give you more specific instructions. If you have problems or questions, contact your health care provider. What can I expect after the procedure? After the procedure, it is common to have: A small amount of blood in your stool for 24 hours after the procedure. Some gas. Mild cramping or bloating of your abdomen. Follow these instructions at home: Eating and drinking  Drink enough fluid to keep your urine pale yellow. Follow instructions from your health care provider about eating or drinking restrictions. Resume your normal diet as told by your health care provider. Avoid heavy or fried foods that are hard to digest. Activity Rest as told by your health care provider.  Avoid sitting for a long time without moving. Get up to take short walks every 1-2 hours. This is important to improve blood flow and breathing. Ask for help if you feel weak or unsteady. Return to your normal activities as told by your health care provider. Ask your health care provider what activities are safe for you. Managing cramping and bloating  Try walking around when you have cramps or feel bloated. If directed, apply heat to your abdomen as told by your health care provider. Use the heat source that your health care provider recommends, such as a moist heat pack or a heating pad. Place a towel between your skin and the heat source. Leave the heat on for 20-30 minutes. Remove the heat if your skin turns bright red. This is especially important if you are unable to feel pain, heat, or cold. You have a greater risk of getting burned. General instructions If you were given a sedative during the procedure, it can affect you for several hours. Do not drive or operate machinery until your health care provider says that it is safe. For the first 24 hours after the procedure: Do not sign important documents. Do not drink alcohol. Do your  regular daily activities at a slower pace than normal. Eat soft foods that are easy to digest. Take over-the-counter and prescription medicines only as told by your health care provider. Keep all follow-up visits. This is important. Contact a health care provider if: You have blood in your stool 2-3 days after the procedure. Get help right away if: You have more than a small spotting of blood in your stool. You have large blood clots in your stool. You have swelling of your abdomen. You have nausea or vomiting. You have a fever. You have increasing pain in your abdomen that is not relieved with medicine. These symptoms may be an emergency. Get help right away. Call 911. Do not wait to see if the symptoms will go away. Do not drive yourself to the hospital. Summary After the procedure, it is common to have a small amount of blood in your stool. You may also have mild cramping and bloating of your abdomen. If you were given a sedative during the procedure, it can affect you for several hours. Do not drive or operate machinery until your health care provider says that it is safe. Get help right away if you have a lot of blood in your stool, nausea or vomiting, a fever, or increased pain in your abdomen. This information is not intended to replace advice given to you by your health care provider. Make sure you discuss any questions you have with your health care provider. Document Revised: 08/13/2022 Document Reviewed: 02/21/2021 Elsevier Patient Education  2024 [employer] Monitored Anesthesia Care, Care After The following information offers guidance on how to care for yourself after your procedure. Your health care provider may also give you more specific instructions. If you have problems or questions, contact your health care provider. What can I expect after the procedure? After the procedure, it is common to have: Tiredness. Little or no memory about what happened during or after  the procedure. Impaired judgment when it comes to making decisions. Nausea or vomiting. Some trouble with balance. Follow these instructions at home: For the time period you were told by your health care provider:  Rest. Do not participate in activities where you could fall or become injured. Do not drive or use machinery. Do not drink alcohol. Do  not take sleeping pills or medicines that cause drowsiness. Do not make important decisions or sign legal documents. Do not take care of children on your own. Medicines Take over-the-counter and prescription medicines only as told by your health care provider. If you were prescribed antibiotics, take them as told by your health care provider. Do not stop using the antibiotic even if you start to feel better. Eating and drinking Follow instructions from your health care provider about what you may eat and drink. Drink enough fluid to keep your urine pale yellow. If you vomit: Drink clear fluids slowly and in small amounts as you are able. Clear fluids include water, ice chips, low-calorie sports drinks, and fruit juice that has water added to it (diluted fruit juice). Eat light and bland foods in small amounts as you are able. These foods include bananas, applesauce, rice, lean meats, toast, and crackers. General instructions  Have a responsible adult stay with you for the time you are told. It is important to have someone help care for you until you are awake and alert. If you have sleep apnea, surgery and some medicines can increase your risk for breathing problems. Follow instructions from your health care provider about wearing your sleep device: When you are sleeping. This includes during daytime naps. While taking prescription pain medicines, sleeping medicines, or medicines that make you drowsy. Do not use any products that contain nicotine or tobacco. These products include cigarettes, chewing tobacco, and vaping devices, such as  e-cigarettes. If you need help quitting, ask your health care provider. Contact a health care provider if: You feel nauseous or vomit every time you eat or drink. You feel light-headed. You are still sleepy or having trouble with balance after 24 hours. You get a rash. You have a fever. You have redness or swelling around the IV site. Get help right away if: You have trouble breathing. You have new confusion after you get home. These symptoms may be an emergency. Get help right away. Call 911. Do not wait to see if the symptoms will go away. Do not drive yourself to the hospital. This information is not intended to replace advice given to you by your health care provider. Make sure you discuss any questions you have with your health care provider. Document Revised: 11/26/2021 Document Reviewed: 11/26/2021 Elsevier Patient Education  2024 [employer].

## 2023-05-07 ENCOUNTER — Encounter (HOSPITAL_COMMUNITY)
Admission: RE | Admit: 2023-05-07 | Discharge: 2023-05-07 | Disposition: A | Discharge status: Home / Self Care | Payer: Medicare HMO | Source: Ambulatory Visit | Attending: Internal Medicine | Admitting: Internal Medicine

## 2023-05-07 ENCOUNTER — Encounter (HOSPITAL_COMMUNITY): Payer: Self-pay

## 2023-05-07 VITALS — BP 122/86 | HR 95 | Temp 98.1°F | Resp 18 | Ht 61.0 in | Wt 99.0 lb

## 2023-05-07 DIAGNOSIS — I10 Essential (primary) hypertension: Secondary | ICD-10-CM | POA: Insufficient documentation

## 2023-05-07 DIAGNOSIS — F17210 Nicotine dependence, cigarettes, uncomplicated: Secondary | ICD-10-CM | POA: Insufficient documentation

## 2023-05-07 DIAGNOSIS — Z01818 Encounter for other preprocedural examination: Secondary | ICD-10-CM | POA: Diagnosis present

## 2023-05-07 DIAGNOSIS — E119 Type 2 diabetes mellitus without complications: Secondary | ICD-10-CM | POA: Diagnosis not present

## 2023-05-07 DIAGNOSIS — R9431 Abnormal electrocardiogram [ECG] [EKG]: Secondary | ICD-10-CM | POA: Diagnosis not present

## 2023-05-07 DIAGNOSIS — Z01810 Encounter for preprocedural cardiovascular examination: Secondary | ICD-10-CM | POA: Insufficient documentation

## 2023-05-07 HISTORY — DX: Personal history of other specified conditions: Z87.898

## 2023-05-07 HISTORY — DX: Fibromyalgia: M79.7

## 2023-05-07 NOTE — Progress Notes (Addendum)
Candace Harris, female    DOB: 1951-02-05    MRN: [id number]   Brief patient profile:  69 yobf active smoker with GOLD I COPD by pfts 02/2019 transferred care to [facility] office 11/02/2020 p admit (see below)   From Dr Chestine Spore 11/24/19 Elderly BF with a history of tobacco abuse and COPD who presents for evaluation of dyspnea on exertion.   She was diagnosed with COPD about 2 years ago but has had progressively worsening shortness of breath over the last 2 years.  She also has cough, sputum production, wheezing, but dyspnea on exertion is her main complaint.  Her activity is fairly limited; she is only able to walk a few steps around her house without stopping.   She is on 3 L supplemental oxygen at home.  She was prescribed 2.5 L but had ongoing dyspnea on titrated up to 3 L.  Her home saturations are usually in the 90s, but sometimes drop into the 80s.  She is currently using her Trelegy inhaler daily with frequent albuterol.  She continues to smoke 0.25- 0.5 packs/day; she has smoked 0.5 ppd for the last 50 years.     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  Rec: trelegy Check echo: 12/15/19  G I diastolic dysfunction / no cor pulmonale    Stopped trelegy  mid Jan 2022 due irritated mouth   Admit date: 08/15/2020 Discharge date: 08/18/2020 Brief Hospitalization Summary: Please see all hospital notes, images, labs for full details of the hospitalization. ADMISSION HPI: Candace Harris is a 72 y.o. female with medical history significant for HTN, CAD, anxiety, COPD (on 3-6 L of O2 at home), T2DM, GERD and bipolar disorder who presents to the emergency department due to 63-month onset of shortness of breath that has been progressively worsening, she complained of decreased oral intake due to loss of taste, she also endorsed loss of smell and she states that she had diarrhea for 2 to 3 weeks last month which has resolved.  She states that she saw her PCP in [place] who prescribed prednisone  for her, but she has not yet filled the prescription.  She decided to go to the ED for further evaluation due to persistent worsening shortness of breath and concern for dehydration.   ED Course: In the emergency department, she was tachycardic and intermittently tachypneic. Work-up in the ED showed leukocytosis, thrombocytosis, hypokalemia, hyponatremia, BUN/creatinine 41/1.70 (baseline creatinine was 0.7). D-dimer 0.78. Chest x-ray was reflective of emphysema but showed no active cardiopulmonary disease Breathing treatment was provided, Solu-Medrol 125 Mg x1 was given, IV Ativan Librium x1 due to anxiety was given and patient was provided with 500 mL of IV NS. Hospitalist was asked to admit patient for further evaluation and management.   Hospital Course    Acute on chronic respiratory failure with hypoxia - secondary to COPD exacerbation.  Pt much improved after IV steroids, bronchodilators and supportive measures. .  Prednisone taper.   Hypomagnesemia - IV replacement given and repleted.  Hypokalemia - repleted.  Essential hypertension - continue home meds GERD - protonix for GI protection.  Type 2 DM - monitored and stable.   AKI - resolved after hydration.    Discharge Diagnoses:  Principal Problem:   Shortness of breath Active Problems:   AKI (acute kidney injury) (HCC)   Dehydration   Leukocytosis   Thrombocytosis   Essential hypertension   Elevated d-dimer   Hypokalemia   Anxiety   COPD  with acute exacerbation (HCC)   Hyperlipidemia   Diabetes mellitus (HCC)   GERD (gastroesophageal reflux disease)   CAD (coronary artery disease)        History of Present Illness  11/02/2020  Pulmonary/ 1st office eval/ Divit Stipp / [facility] GOLD I copd with component of UACS/ anxiety  Chief Complaint  Patient presents with   Follow-up    Productive cough with white phlegm since January 2022  Dyspnea:  Can do 17 steps at appt complex but has to stop at top to recover / last  shopping x 2 y prior to OV   Cough: hocking min white daytime off gerd rx  Sleep: on side flat bed s resp symptoms "as long as I have on my oxygen"  SABA use: avg 6-8 x per day / no maint rx  02 2lpm 24/7 rec Plan A = Automatic = Always=    Breztri Take 2 puffs first thing in am and then another 2 puffs about 12 hours later.  Work on inhaler technique: Plan B = Backup (to supplement plan A, not to replace it) Only use your albuterol inhaler as a rescue medication Try prilosec 40mg   Take 30-60 min before first meal of the day and Pepcid ac (famotidine) 20 mg one after supper  Until return  GERD diet  Please schedule a follow up office visit in 6 weeks, call sooner if needed with all medications /inhalers/ solutions in hand so we can verify exactly what you are taking. This includes all medications from all doctors and over the counters         01/13/2023  f/u ov/[place] office/Ambry Dix re: GOLD 1 copd  and anemia maint on symbicort 160/spiriva  did not  bring meds except hfa / still smoking  Chief Complaint  Patient presents with   Follow-up   Dyspnea:  same as last ov/ anemia has not been corrected but w/u in progress  Cough: varies min mucoid  Sleeping: flat bed 3 pillows  SABA use: p exercise only 02: 2.5 to 3lpm not titrating  Rec Plan A = Automatic = Always=    symbicort 160 and spiriva 2 puffs of each 1st thing in am and then another 2 puffs symbicort  12 hours later Work on inhaler technique:  Plan B = Backup (to supplement plan A, not to replace it) Only use your albuterol inhaler as a rescue medication Plan C = Crisis (instead of Plan B but only if Plan B stops working) - only use your albuterol nebulizer if you first try Plan B  Also  Ok to try albuterol 15 min before an activity (on alternating days between your inhaler/ nebulizer)  that you know would usually make you short of breath     Make sure you check your oxygen saturation  AT  your highest level of activity (not after  you stop)   to be sure it stays over 90%   Please schedule a follow up office visit in 3 months call sooner if needed with all medications /inhalers/ solutions in hand    05/08/2023  f/u ov/[place] office/Taina Landry re: GOLD 1 copd/ 02 dep/ anemia  maint on symbicort 160  did not bring meds / still smoking / angioedema > ER better on steroids/ h1 and h2  Chief Complaint  Patient presents with   COPD    GOLD I   Dyspnea:  shops at [employer] / crosses parking lot ok  Cough: not much  Sleeping: flat bed on  side 3 pillows  s resp cc  SABA use: nt much  02: 2lpm hs / 2lpm daytime but 3 lpm walking > 95%  Lung cancer screening: not since 12/21/21   No obvious day to day or daytime variability or assoc excess/ purulent sputum or mucus plugs or hemoptysis or cp or chest tightness, subjective wheeze or overt sinus or hb symptoms.    Also denies any obvious fluctuation of symptoms with weather or environmental changes or other aggravating or alleviating factors except as outlined above   No unusual exposure hx or h/o childhood pna/ asthma or knowledge of premature birth.  Current Allergies, Complete Past Medical History, Past Surgical History, Family History, and Social History were reviewed in [employer] record.  ROS  The following are not active complaints unless bolded Hoarseness, sore throat, dysphagia, dental problems, itching, sneezing,  nasal congestion or discharge of excess mucus or purulent secretions, ear ache,   fever, chills, sweats, unintended wt loss or wt gain, classically pleuritic or exertional cp,  orthopnea pnd or arm/hand swelling  or leg swelling, presyncope, palpitations, abdominal pain, anorexia, nausea, vomiting, diarrhea  or change in bowel habits or change in bladder habits, change in stools or change in urine, dysuria, hematuria,  rash, arthralgias, visual complaints, headache, numbness, weakness or ataxia or problems with walking//2 wheel walker or  coordination,  change in mood/ anxious  or  memory.        Current Meds  Medication Sig   acetaminophen (TYLENOL) 650 MG CR tablet Take 650 mg by mouth every 8 (eight) hours as needed for pain.   albuterol (PROVENTIL) (2.5 MG/3ML) 0.083% nebulizer solution Take 3 mLs (2.5 mg total) by nebulization every 4 (four) hours as needed for wheezing or shortness of breath.   albuterol (VENTOLIN HFA) 108 (90 Base) MCG/ACT inhaler Inhale 2 puffs into the lungs every 4 (four) hours as needed for wheezing or shortness of breath. for wheezing   amLODipine (NORVASC) 5 MG tablet Take 5 mg by mouth daily.   baclofen (LIORESAL) 10 MG tablet Take 10 mg by mouth 2 (two) times daily.   budesonide-formoterol (SYMBICORT) 160-4.5 MCG/ACT inhaler INHALE 2 PUFFS INTO THE LUNGS IN THE MORNING AND AT BEDTIME   busPIRone (BUSPAR) 5 MG tablet Take 5 mg by mouth. TAKE ONE TABLET BY MOUTH TWICE DAILY @ 9AM & 5PM FOR 90 DAYS   CAPLYTA 42 MG capsule Take 42 mg by mouth daily.   cetirizine (ZYRTEC) 10 MG tablet Take 1 tablet by mouth daily.   cyclobenzaprine (FLEXERIL) 10 MG tablet Take 10 mg by mouth 2 (two) times daily.   dicyclomine (BENTYL) 20 MG tablet Take 20 mg by mouth 4 (four) times daily.   famotidine (PEPCID) 20 MG tablet TAKE 1 TABLET DAILY AFTER SUPPER   fluticasone (FLONASE) 50 MCG/ACT nasal spray Place 2 sprays into both nostrils daily.   HYDROcodone-acetaminophen (NORCO) 10-325 MG tablet Take 1 tablet by mouth 3 (three) times daily as needed for pain.   hydrOXYzine (ATARAX) 10 MG tablet Take 10 mg by mouth 2 (two) times daily as needed.   latanoprost (XALATAN) 0.005 % ophthalmic solution 1 drop at bedtime.   losartan (COZAAR) 50 MG tablet Take 50 mg by mouth every morning.   megestrol (MEGACE) 20 MG tablet Take 20 mg by mouth daily.   metFORMIN (GLUCOPHAGE) 500 MG tablet Take 500 mg by mouth 2 (two) times daily with a meal.   Na Sulfate-K Sulfate-Mg Sulf 17.5-3.13-1.6 GM/177ML SOLN As  directed   naloxone  (NARCAN) nasal spray 4 mg/0.1 mL Place 0.4 mg into the nose once.   nitroGLYCERIN (NITROSTAT) 0.4 MG SL tablet Place 0.4 mg under the tongue every 5 (five) minutes as needed for chest pain.   omeprazole (PRILOSEC) 40 MG capsule TAKE 1 CAPSULE 30 TO 60 MINUTES BEFORE FIRST MEAL OF THE DAY   ondansetron (ZOFRAN) 4 MG tablet Take 4 mg by mouth daily.   pregabalin (LYRICA) 25 MG capsule Take 25 mg by mouth 2 (two) times daily.   rosuvastatin (CRESTOR) 20 MG tablet Take 20 mg by mouth at bedtime.   sertraline (ZOLOFT) 100 MG tablet Take 100 mg by mouth every morning.              Past Medical History:  Diagnosis Date   Anxiety    Bipolar affective disorder (HCC)    CAD (coronary artery disease)    Chronic respiratory failure (HCC)    COPD (chronic obstructive pulmonary disease) (HCC)    DM (diabetes mellitus) (HCC)    GERD (gastroesophageal reflux disease)    HTN (hypertension)    Polymyalgia rheumatica (HCC)           Objective:    Wt  05/08/2023       96  01/13/2023           112  12/02/2022        110  05/30/2022      107  04/23/2022      104  03/25/2022        103  09/24/2021        107 03/15/2021         107  12/14/20 107 lb 12.8 oz (48.9 kg)  11/02/20 112 lb 12.8 oz (51.2 kg)  08/15/20 132 lb 4.4 oz (60 kg)    Vital signs reviewed  05/08/2023  - Note at rest 02 sats  92% on RA   General appearance:    frail elerly bf > stated age in appearance.    HEENT : Oropharynx  clear     NECK :  without  apparent JVD/ palpable Nodes/TM    LUNGS: no acc muscle use,  Mild barrel  contour chest wall with bilateral  Distant bs s audible wheeze and  without cough on insp or exp maneuvers  and mild  Hyperresonant  to  percussion bilaterally     CV:  RRR  no s3 or murmur or increase in P2, and no edema   ABD:  soft and nontender with pos end  insp Hoover's  in the supine position.  No bruits or organomegaly appreciated   MS:  slow gait with 2 wheeled walker/ ext warm without  deformities Or obvious joint restrictions  calf tenderness, cyanosis or clubbing     SKIN: warm and dry without lesions    NEURO:  alert, approp, nl sensorium with  no motor or cerebellar deficits apparent.           Lab Results  Component Value Date   HGB 12.3 04/30/2023   HGB 10.2 (L) 01/13/2023   HGB 8.3 (L) 12/10/2022   HGB 7.7 (L) 12/02/2022           Assessment

## 2023-05-08 ENCOUNTER — Encounter: Payer: Self-pay | Admitting: Internal Medicine

## 2023-05-08 ENCOUNTER — Ambulatory Visit (INDEPENDENT_AMBULATORY_CARE_PROVIDER_SITE_OTHER): Payer: Medicare HMO | Admitting: Internal Medicine

## 2023-05-08 VITALS — BP 111/73 | HR 113 | Ht 61.0 in | Wt 96.0 lb

## 2023-05-08 DIAGNOSIS — T783XXD Angioneurotic edema, subsequent encounter: Secondary | ICD-10-CM

## 2023-05-08 DIAGNOSIS — T783XXS Angioneurotic edema, sequela: Secondary | ICD-10-CM | POA: Diagnosis not present

## 2023-05-08 DIAGNOSIS — J449 Chronic obstructive pulmonary disease, unspecified: Secondary | ICD-10-CM

## 2023-05-08 DIAGNOSIS — F17210 Nicotine dependence, cigarettes, uncomplicated: Secondary | ICD-10-CM

## 2023-05-08 DIAGNOSIS — T783XXA Angioneurotic edema, initial encounter: Secondary | ICD-10-CM | POA: Insufficient documentation

## 2023-05-08 NOTE — Patient Instructions (Addendum)
No change in pulmonary medications   My office will be contacting you by phone for referral to Dr Dellis Anes   - if you don't hear back from my office within one week please call us back or notify us thru MyChart and we'll address it right away.    Please schedule a follow up visit in 6  months but call sooner if needed

## 2023-05-09 ENCOUNTER — Encounter (HOSPITAL_COMMUNITY)
Admission: RE | Disposition: A | Discharge status: Home / Self Care | Payer: Self-pay | Source: Home / Self Care | Attending: Internal Medicine

## 2023-05-09 ENCOUNTER — Encounter (HOSPITAL_COMMUNITY): Payer: Self-pay

## 2023-05-09 ENCOUNTER — Ambulatory Visit (HOSPITAL_COMMUNITY): Payer: Medicare HMO | Admitting: Anesthesiology

## 2023-05-09 ENCOUNTER — Other Ambulatory Visit: Payer: Self-pay

## 2023-05-09 ENCOUNTER — Ambulatory Visit (HOSPITAL_COMMUNITY)
Admission: RE | Admit: 2023-05-09 | Discharge: 2023-05-09 | Disposition: A | Discharge status: Home / Self Care | Payer: Medicare HMO | Attending: Internal Medicine | Admitting: Internal Medicine

## 2023-05-09 DIAGNOSIS — R1310 Dysphagia, unspecified: Secondary | ICD-10-CM | POA: Insufficient documentation

## 2023-05-09 DIAGNOSIS — D509 Iron deficiency anemia, unspecified: Secondary | ICD-10-CM | POA: Insufficient documentation

## 2023-05-09 DIAGNOSIS — K635 Polyp of colon: Secondary | ICD-10-CM

## 2023-05-09 DIAGNOSIS — K2970 Gastritis, unspecified, without bleeding: Secondary | ICD-10-CM | POA: Insufficient documentation

## 2023-05-09 DIAGNOSIS — M797 Fibromyalgia: Secondary | ICD-10-CM | POA: Insufficient documentation

## 2023-05-09 DIAGNOSIS — D126 Benign neoplasm of colon, unspecified: Secondary | ICD-10-CM | POA: Diagnosis not present

## 2023-05-09 DIAGNOSIS — K219 Gastro-esophageal reflux disease without esophagitis: Secondary | ICD-10-CM | POA: Diagnosis not present

## 2023-05-09 DIAGNOSIS — K5730 Diverticulosis of large intestine without perforation or abscess without bleeding: Secondary | ICD-10-CM | POA: Insufficient documentation

## 2023-05-09 DIAGNOSIS — Z9981 Dependence on supplemental oxygen: Secondary | ICD-10-CM | POA: Insufficient documentation

## 2023-05-09 DIAGNOSIS — J449 Chronic obstructive pulmonary disease, unspecified: Secondary | ICD-10-CM | POA: Diagnosis not present

## 2023-05-09 DIAGNOSIS — D124 Benign neoplasm of descending colon: Secondary | ICD-10-CM | POA: Insufficient documentation

## 2023-05-09 DIAGNOSIS — D125 Benign neoplasm of sigmoid colon: Secondary | ICD-10-CM | POA: Insufficient documentation

## 2023-05-09 DIAGNOSIS — K648 Other hemorrhoids: Secondary | ICD-10-CM | POA: Diagnosis not present

## 2023-05-09 DIAGNOSIS — K3189 Other diseases of stomach and duodenum: Secondary | ICD-10-CM

## 2023-05-09 DIAGNOSIS — K449 Diaphragmatic hernia without obstruction or gangrene: Secondary | ICD-10-CM | POA: Insufficient documentation

## 2023-05-09 DIAGNOSIS — R6881 Early satiety: Secondary | ICD-10-CM | POA: Diagnosis not present

## 2023-05-09 DIAGNOSIS — I2510 Atherosclerotic heart disease of native coronary artery without angina pectoris: Secondary | ICD-10-CM | POA: Insufficient documentation

## 2023-05-09 DIAGNOSIS — I10 Essential (primary) hypertension: Secondary | ICD-10-CM | POA: Diagnosis not present

## 2023-05-09 DIAGNOSIS — E119 Type 2 diabetes mellitus without complications: Secondary | ICD-10-CM | POA: Diagnosis not present

## 2023-05-09 DIAGNOSIS — R634 Abnormal weight loss: Secondary | ICD-10-CM | POA: Insufficient documentation

## 2023-05-09 DIAGNOSIS — M353 Polymyalgia rheumatica: Secondary | ICD-10-CM | POA: Diagnosis not present

## 2023-05-09 HISTORY — PX: ESOPHAGOGASTRODUODENOSCOPY (EGD) WITH PROPOFOL: SHX5813

## 2023-05-09 HISTORY — PX: COLONOSCOPY WITH PROPOFOL: SHX5780

## 2023-05-09 HISTORY — PX: BIOPSY: SHX5522

## 2023-05-09 HISTORY — PX: POLYPECTOMY: SHX5525

## 2023-05-09 HISTORY — PX: BALLOON DILATION: SHX5330

## 2023-05-09 LAB — GLUCOSE, CAPILLARY: Glucose-Capillary: 92 mg/dL (ref 70–99)

## 2023-05-09 SURGERY — COLONOSCOPY WITH PROPOFOL
Anesthesia: General

## 2023-05-09 MED ORDER — PROPOFOL 500 MG/50ML IV EMUL
INTRAVENOUS | Status: DC | PRN
  Administered 2023-05-09: 60 ug/kg/min via INTRAVENOUS

## 2023-05-09 MED ORDER — PHENYLEPHRINE 80 MCG/ML (10ML) SYRINGE FOR IV PUSH (FOR BLOOD PRESSURE SUPPORT)
PREFILLED_SYRINGE | INTRAVENOUS | Status: DC | PRN
  Administered 2023-05-09 (×2): 80 ug via INTRAVENOUS

## 2023-05-09 MED ORDER — LIDOCAINE HCL (CARDIAC) PF 100 MG/5ML IV SOSY
PREFILLED_SYRINGE | INTRAVENOUS | Status: DC | PRN
  Administered 2023-05-09: 40 mg via INTRAVENOUS

## 2023-05-09 MED ORDER — LACTATED RINGERS IV SOLN: INTRAVENOUS | Status: DC | PRN

## 2023-05-09 MED ORDER — EPHEDRINE SULFATE-NACL 50-0.9 MG/10ML-% IV SOSY
PREFILLED_SYRINGE | INTRAVENOUS | Status: DC | PRN
  Administered 2023-05-09: 5 mg via INTRAVENOUS

## 2023-05-09 MED ORDER — STERILE WATER FOR IRRIGATION IR SOLN
Status: DC | PRN
  Administered 2023-05-09: 120 mL

## 2023-05-09 NOTE — Transfer of Care (Signed)
Immediate Anesthesia Transfer of Care Note  Patient: Candace Harris  Procedure(s) Performed: COLONOSCOPY WITH PROPOFOL ESOPHAGOGASTRODUODENOSCOPY (EGD) WITH PROPOFOL BALLOON DILATION BIOPSY POLYPECTOMY  Patient Location: Short Stay  Anesthesia Type:General  Level of Consciousness: awake and alert   Airway & Oxygen Therapy: Patient Spontanous Breathing and Patient connected to nasal cannula oxygen  Post-op Assessment: Report given to RN and Post -op Vital signs reviewed and stable  Post vital signs: Reviewed and stable  Last Vitals:  Vitals Value Taken Time  BP 94/58 05/09/23 0906  Temp 36.3 C 05/09/23 0906  Pulse 85 05/09/23 0906  Resp 18 05/09/23 0906  SpO2 100 % 05/09/23 0906    Last Pain:  Vitals:   05/09/23 0906  TempSrc: Axillary  PainSc: 0-No pain         Complications: No notable events documented. Pt on 2L O2 which is patient home O2 requirement. Explained to patient that she has hives on right thigh; pt states that is normal for her and she itches all the time.

## 2023-05-09 NOTE — Op Note (Signed)
[facility] Patient Name: Candace Harris Procedure Date: 05/09/2023 8:13 AM MRN: [id number] Date of Birth: 07/28/1950 Attending MD: Hennie Duos. Maple Mirza, [id number] CSN: [id number] Age: 72 Admit Type: Outpatient Procedure:                Upper GI endoscopy Indications:              Dysphagia, Heartburn, Early satiety, Weight loss Providers:                Hennie Duos. Marletta Lor, DO, Francoise Ceo RN, RN, Nena Polio, RN, Lennice Sites Technician, Technician Referring MD:              Medicines:                See the Anesthesia note for documentation of the                            administered medications Complications:            No immediate complications. Estimated Blood Loss:     Estimated blood loss was minimal. Procedure:                Pre-Anesthesia Assessment:                           - The anesthesia plan was to use monitored                            anesthesia care (MAC).                           After obtaining informed consent, the endoscope was                            passed under direct vision. Throughout the                            procedure, the patient's blood pressure, pulse, and                            oxygen saturations were monitored continuously. The                            GIF-H190 ([id number]) scope was introduced through the                            mouth, and advanced to the second part of duodenum.                            The upper GI endoscopy was accomplished without                            difficulty. The patient tolerated the procedure  well. Scope In: 8:24:16 AM Scope Out: 8:29:47 AM Total Procedure Duration: 0 hours 5 minutes 31 seconds  Findings:      A small hiatal hernia was present.      No endoscopic abnormality was evident in the esophagus to explain the       patient's complaint of dysphagia. Preparations were made for empiric       dilation. A TTS dilator was passed  through the scope. Dilation with an       18-19-20 mm balloon dilator was performed to 18 mm. Dilation was       performed with a mild resistance at 18 mm. Estimated blood loss was none.      Patchy mild inflammation characterized by erythema was found in the       gastric body and in the gastric antrum. Biopsies were taken with a cold       forceps for Helicobacter pylori testing.      The duodenal bulb, first portion of the duodenum and second portion of       the duodenum were normal. Impression:               - Small hiatal hernia.                           - Gastritis. Biopsied.                           - Normal duodenal bulb, first portion of the                            duodenum and second portion of the duodenum. Moderate Sedation:      Per Anesthesia Care Recommendation:           - Patient has a contact number available for                            emergencies. The signs and symptoms of potential                            delayed complications were discussed with the                            patient. Return to normal activities tomorrow.                            Written discharge instructions were provided to the                            patient.                           - Resume previous diet.                           - Continue present medications.                           - Await pathology results.                           -  Repeat upper endoscopy PRN for retreatment.                           - Return to GI clinic in 3 months. Procedure Code(s):        --- Professional ---                           [id number], Esophagogastroduodenoscopy, flexible,                            transoral; with biopsy, single or multiple Diagnosis Code(s):        --- Professional ---                           K29.70, Gastritis, unspecified, without bleeding                           R13.10, Dysphagia, unspecified                           R12, Heartburn                            R68.81, Early satiety                           R63.4, Abnormal weight loss CPT copyright 2022 American Medical Association. All rights reserved. The codes documented in this report are preliminary and upon coder review may  be revised to meet current compliance requirements. Hennie Duos. Marletta Lor, DO Hennie Duos. Marletta Lor, DO 05/09/2023 8:33:12 AM This report has been signed electronically. Number of Addenda: 0

## 2023-05-09 NOTE — Discharge Instructions (Addendum)
EGD Discharge instructions Please read the instructions outlined below and refer to this sheet in the next few weeks. These discharge instructions provide you with general information on caring for yourself after you leave the hospital. Your doctor may also give you specific instructions. While your treatment has been planned according to the most current medical practices available, unavoidable complications occasionally occur. If you have any problems or questions after discharge, please call your doctor. ACTIVITY You may resume your regular activity but move at a slower pace for the next 24 hours.  Take frequent rest periods for the next 24 hours.  Walking will help expel (get rid of) the air and reduce the bloated feeling in your abdomen.  No driving for 24 hours (because of the anesthesia (medicine) used during the test).  You may shower.  Do not sign any important legal documents or operate any machinery for 24 hours (because of the anesthesia used during the test).  NUTRITION Drink plenty of fluids.  You may resume your normal diet.  Begin with a light meal and progress to your normal diet.  Avoid alcoholic beverages for 24 hours or as instructed by your caregiver.  MEDICATIONS You may resume your normal medications unless your caregiver tells you otherwise.  WHAT YOU CAN EXPECT TODAY You may experience abdominal discomfort such as a feeling of fullness or "gas" pains.  FOLLOW-UP Your doctor will discuss the results of your test with you.  SEEK IMMEDIATE MEDICAL ATTENTION IF ANY OF THE FOLLOWING OCCUR: Excessive nausea (feeling sick to your stomach) and/or vomiting.  Severe abdominal pain and distention (swelling).  Trouble swallowing.  Temperature over 101 F (37.8 C).  Rectal bleeding or vomiting of blood.     Colonoscopy Discharge Instructions  Read the instructions outlined below and refer to this sheet in the next few weeks. These discharge instructions provide you with  general information on caring for yourself after you leave the hospital. Your doctor may also give you specific instructions. While your treatment has been planned according to the most current medical practices available, unavoidable complications occasionally occur.   ACTIVITY You may resume your regular activity, but move at a slower pace for the next 24 hours.  Take frequent rest periods for the next 24 hours.  Walking will help get rid of the air and reduce the bloated feeling in your belly (abdomen).  No driving for 24 hours (because of the medicine (anesthesia) used during the test).   Do not sign any important legal documents or operate any machinery for 24 hours (because of the anesthesia used during the test).  NUTRITION Drink plenty of fluids.  You may resume your normal diet as instructed by your doctor.  Begin with a light meal and progress to your normal diet. Heavy or fried foods are harder to digest and may make you feel sick to your stomach (nauseated).  Avoid alcoholic beverages for 24 hours or as instructed.  MEDICATIONS You may resume your normal medications unless your doctor tells you otherwise.  WHAT YOU CAN EXPECT TODAY Some feelings of bloating in the abdomen.  Passage of more gas than usual.  Spotting of blood in your stool or on the toilet paper.  IF YOU HAD POLYPS REMOVED DURING THE COLONOSCOPY: No aspirin products for 7 days or as instructed.  No alcohol for 7 days or as instructed.  Eat a soft diet for the next 24 hours.  FINDING OUT THE RESULTS OF YOUR TEST Not all test results are  available during your visit. If your test results are not back during the visit, make an appointment with your caregiver to find out the results. Do not assume everything is normal if you have not heard from your caregiver or the medical facility. It is important for you to follow up on all of your test results.  SEEK IMMEDIATE MEDICAL ATTENTION IF: You have more than a spotting of  blood in your stool.  Your belly is swollen (abdominal distention).  You are nauseated or vomiting.  You have a temperature over 101.  You have abdominal pain or discomfort that is severe or gets worse throughout the day.   Your EGD revealed mild amount inflammation in your stomach.  I took biopsies of this to rule out infection with a bacteria called H. pylori.    I dilated your esophagus today.  Hopefully this helps with feeling of food getting stuck and your swallowing.  Small bowel appeared normal.  Your colonoscopy revealed 3 polyp(s) which I removed successfully. Await pathology results, my office will contact you. I recommend repeating colonoscopy in 5 years for surveillance purposes.   You also have diverticulosis and internal hemorrhoids. I would recommend increasing fiber in your diet or adding OTC Benefiber/Metamucil. Be sure to drink at least 4 to 6 glasses of water daily. Follow-up with GI in 6-8 weeks    I hope you have a great rest of your week!  Hennie Duos. Marletta Lor, D.O. Gastroenterology and Hepatology [facility]

## 2023-05-09 NOTE — Addendum Note (Signed)
Addended by: Sandrea Hughs B on: 05/09/2023 05:23 AM   Modules accepted: Level of Service

## 2023-05-09 NOTE — H&P (Signed)
Primary Care Physician:  Rebekah Chesterfield, NP Primary Gastroenterologist:  Dr. Marletta Lor  Pre-Procedure History & Physical: HPI:  Candace Harris is a 72 y.o. female is here for an EGD with possible dilation due to history of dysphagia, GERD, early satiety, weight loss and colonoscopy for iron deficiency anemia.   Past Medical History:  Diagnosis Date   Anxiety    Arthritis    Bipolar affective disorder (HCC)    CAD (coronary artery disease)    Chronic respiratory failure (HCC)    Complication of anesthesia    COPD (chronic obstructive pulmonary disease) (HCC)    Depression    DM (diabetes mellitus) (HCC)    Fibromyalgia    GERD (gastroesophageal reflux disease)    History of angioedema    HTN (hypertension)    Polymyalgia rheumatica (HCC)    PONV (postoperative nausea and vomiting)    Stroke [facility])     Past Surgical History:  Procedure Laterality Date   ABDOMINAL HYSTERECTOMY N/A    ANTERIOR FUSION CERVICAL SPINE  2012   C5-7   BACK SURGERY     BIOPSY  03/13/2022   Procedure: BIOPSY;  Surgeon: Corbin Ade, MD;  Location: [facility];  Service: Endoscopy;;   ESOPHAGOGASTRODUODENOSCOPY (EGD) WITH PROPOFOL N/A 03/13/2022   Procedure: ESOPHAGOGASTRODUODENOSCOPY (EGD) WITH PROPOFOL;  Surgeon: Corbin Ade, MD;  Location: [facility];  Service: Endoscopy;  Laterality: N/A;  12:30pm   MALONEY DILATION N/A 03/13/2022   Procedure: MALONEY DILATION;  Surgeon: Corbin Ade, MD;  Location: [facility];  Service: Endoscopy;  Laterality: N/A;   SHOULDER SURGERY      Prior to Admission medications   Medication Sig Start Date End Date Taking? Authorizing Provider  acetaminophen (TYLENOL) 650 MG CR tablet Take 650 mg by mouth every 8 (eight) hours as needed for pain.   Yes [provider]  albuterol (PROVENTIL) (2.5 MG/3ML) 0.083% nebulizer solution Take 3 mLs (2.5 mg total) by nebulization every 4 (four) hours as needed for wheezing or shortness of breath. 04/15/22  Yes  Nyoka Cowden, MD  albuterol (VENTOLIN HFA) 108 (90 Base) MCG/ACT inhaler Inhale 2 puffs into the lungs every 4 (four) hours as needed for wheezing or shortness of breath. for wheezing 08/30/22  Yes Nyoka Cowden, MD  amLODipine (NORVASC) 5 MG tablet Take 5 mg by mouth daily. 12/19/16  Yes [provider]  baclofen (LIORESAL) 10 MG tablet Take 10 mg by mouth 2 (two) times daily. 01/27/23  Yes [provider]  budesonide-formoterol (SYMBICORT) 160-4.5 MCG/ACT inhaler INHALE 2 PUFFS INTO THE LUNGS IN THE MORNING AND AT BEDTIME 05/06/23  Yes Nyoka Cowden, MD  busPIRone (BUSPAR) 5 MG tablet Take 5 mg by mouth. TAKE ONE TABLET BY MOUTH TWICE DAILY @ 9AM & 5PM FOR 90 DAYS 04/21/23  Yes [provider]  CAPLYTA 42 MG capsule Take 42 mg by mouth daily. 02/07/23  Yes [provider]  cetirizine (ZYRTEC) 10 MG tablet Take 1 tablet by mouth daily. 04/29/23 05/29/23 Yes [provider]  cyclobenzaprine (FLEXERIL) 10 MG tablet Take 10 mg by mouth 2 (two) times daily. 06/17/20  Yes [provider]  dicyclomine (BENTYL) 20 MG tablet Take 20 mg by mouth 4 (four) times daily. 06/17/20  Yes [provider]  famotidine (PEPCID) 20 MG tablet TAKE 1 TABLET DAILY AFTER SUPPER 03/31/23  Yes Nyoka Cowden, MD  fluticasone (FLONASE) 50 MCG/ACT nasal spray Place 2 sprays into both nostrils daily.  Yes [provider]  metFORMIN (GLUCOPHAGE) 500 MG tablet Take 500 mg by mouth 2 (two) times daily with a meal. 08/13/16  Yes [provider]  Na Sulfate-K Sulfate-Mg Sulf 17.5-3.13-1.6 GM/177ML SOLN As directed 04/01/23  Yes Jovonte Commins, Hennie Duos, DO  naloxone [facility]) nasal spray 4 mg/0.1 mL Place 0.4 mg into the nose once.   Yes [provider]  nitroGLYCERIN (NITROSTAT) 0.4 MG SL tablet Place 0.4 mg under the tongue every 5 (five) minutes as needed for chest pain. 06/17/20  Yes [provider]  omeprazole (PRILOSEC) 40 MG capsule TAKE 1  CAPSULE 30 TO 60 MINUTES BEFORE FIRST MEAL OF THE DAY 04/15/22  Yes Nyoka Cowden, MD  ondansetron (ZOFRAN) 4 MG tablet Take 4 mg by mouth daily. 11/11/19  Yes [provider]  pregabalin (LYRICA) 25 MG capsule Take 25 mg by mouth 2 (two) times daily. 02/27/23  Yes [provider]  rosuvastatin (CRESTOR) 20 MG tablet Take 20 mg by mouth at bedtime. 06/12/20  Yes [provider]  sertraline (ZOLOFT) 100 MG tablet Take 100 mg by mouth every morning. 02/21/23  Yes [provider]  HYDROcodone-acetaminophen (NORCO) 10-325 MG tablet Take 1 tablet by mouth 3 (three) times daily as needed for pain. 01/25/22   [provider]  hydrOXYzine (ATARAX) 10 MG tablet Take 10 mg by mouth 2 (two) times daily as needed. 03/03/23   [provider]  latanoprost (XALATAN) 0.005 % ophthalmic solution 1 drop at bedtime. 03/31/23   [provider]  losartan (COZAAR) 50 MG tablet Take 50 mg by mouth every morning. 02/21/23   [provider]  megestrol (MEGACE) 20 MG tablet Take 20 mg by mouth daily.    [provider]    Allergies as of 04/01/2023 - Review Complete 03/06/2023  Allergen Reaction Noted   Alendronate Other (See Comments) 02/08/2014   Ibuprofen Nausea And Vomiting 11/04/2013   Ramipril Nausea And Vomiting and Other (See Comments) 11/04/2013   Sulfabenzamide Other (See Comments) 11/04/2013    Family History  Problem Relation Age of Onset   Hypertension Mother    Breast cancer Sister    Colon cancer Neg Hx     Social History   Socioeconomic History   Marital status: Single    Spouse name: Not on file   Number of children: Not on file   Years of education: Not on file   Highest education level: Not on file  Occupational History   Not on file  Tobacco Use   Smoking status: Every Day    Current packs/day: 0.50    Average packs/day: 0.5 packs/day for 50.0 years (25.0 ttl pk-yrs)    Types: Cigarettes   Smokeless tobacco:  Never  Vaping Use   Vaping status: Never Used  Substance and Sexual Activity   Alcohol use: Never   Drug use: Never   Sexual activity: Not on file  Other Topics Concern   Not on file  Social History Narrative   Not on file   Social Determinants of Health   Financial Resource Strain: Not on file  Food Insecurity: Not on file  Transportation Needs: Not on file  Physical Activity: Not on file  Stress: Not on file  Social Connections: Not on file  Intimate Partner Violence: Unknown (10/18/2021)   Received from [facility], [facility]   HITS    Physically Hurt: Not on file    Insult or Talk Down To: Not on file  Threaten Physical Harm: Not on file    Scream or Curse: Not on file    Review of Systems: General: Negative for fever, chills, fatigue, weakness. Eyes: Negative for vision changes.  ENT: Negative for hoarseness, difficulty swallowing , nasal congestion. CV: Negative for chest pain, angina, palpitations, dyspnea on exertion, peripheral edema.  Respiratory: Negative for dyspnea at rest, dyspnea on exertion, cough, sputum, wheezing.  GI: See history of present illness. GU:  Negative for dysuria, hematuria, urinary incontinence, urinary frequency, nocturnal urination.  MS: Negative for joint pain, low back pain.  Derm: Negative for rash or itching.  Neuro: Negative for weakness, abnormal sensation, seizure, frequent headaches, memory loss, confusion.  Psych: Negative for anxiety, depression Endo: Negative for unusual weight change.  Heme: Negative for bruising or bleeding. Allergy: Negative for rash or hives.  Physical Exam: Vital signs in last 24 hours: Temp:  [97.8 F (36.6 C)] 97.8 F (36.6 C) (10/25 0718) Pulse Rate:  [88-113] 88 (10/25 0718) Resp:  [16] 16 (10/25 0718) BP: (111-116)/(73-81) 116/81 (10/25 0718) SpO2:  [92 %-100 %] 100 % (10/25 0718) Weight:  [43.1 kg-43.5 kg] 43.1 kg (10/25 0717)   General:   Alert,  Well-developed, well-nourished,  pleasant and cooperative in NAD Head:  Normocephalic and atraumatic. Eyes:  Sclera clear, no icterus.   Conjunctiva pink. Ears:  Normal auditory acuity. Nose:  No deformity, discharge,  or lesions. Msk:  Symmetrical without gross deformities. Normal posture. Extremities:  Without clubbing or edema. Neurologic:  Alert and  oriented x4;  grossly normal neurologically. Skin:  Intact without significant lesions or rashes. Psych:  Alert and cooperative. Normal mood and affect.   Impression/Plan: Candace Harris is here for an EGD with possible dilation due to history of dysphagia, GERD, early satiety, weight loss and colonoscopy for iron deficiency anemia.   Risks, benefits, limitations, imponderables and alternatives regarding procedure have been reviewed with the patient. Questions have been answered. All parties agreeable.

## 2023-05-09 NOTE — Assessment & Plan Note (Signed)
Active smoker -  PFT's  11/02/2020  FEV1 1.42 (85 % ) ratio 0.67  p 0 % improvement from saba p ? 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  - 12/02/2022    >  added spiriva 2.5 x 2 puffs each am - 01/13/2023  After extensive coaching inhaler device,  effectiveness =    75% smi/hfa   Relatively mild copd with mostly AB at this point and prefers just symbicort 160 2bid so no change rx needed

## 2023-05-09 NOTE — Op Note (Signed)
[facility] Patient Name: Candace Harris Procedure Date: 05/09/2023 8:09 AM MRN: [id number] Date of Birth: 04/17/1951 Attending MD: Hennie Duos. Maple Mirza, [id number] CSN: [id number] Age: 72 Admit Type: Outpatient Procedure:                Colonoscopy Indications:              Iron deficiency anemia Providers:                Hennie Duos. Marletta Lor, DO, Emilee Tubb RN, RN, Nena Polio, RN, Lennice Sites Technician, Technician Referring MD:              Medicines:                See the Anesthesia note for documentation of the                            administered medications Complications:             Estimated Blood Loss:     Estimated blood loss was minimal. Procedure:                Pre-Anesthesia Assessment:                           - The anesthesia plan was to use monitored                            anesthesia care (MAC).                           After obtaining informed consent, the colonoscope                            was passed under direct vision. Throughout the                            procedure, the patient's blood pressure, pulse, and                            oxygen saturations were monitored continuously. The                            [id number]-HQ190L ([id number]) scope was introduced through                            the anus and advanced to the the cecum, identified                            by appendiceal orifice and ileocecal valve. The                            colonoscopy was technically difficult and complex                            due to multiple diverticula  in the colon,                            restricted mobility of the colon and a tortuous                            colon. The patient tolerated the procedure well.                            The quality of the bowel preparation was evaluated                            using the BBPS [facility] Bowel Preparation Scale)                            with scores of: Right Colon = 2  (minor amount of                            residual staining, small fragments of stool and/or                            opaque liquid, but mucosa seen well), Transverse                            Colon = 2 (minor amount of residual staining, small                            fragments of stool and/or opaque liquid, but mucosa                            seen well) and Left Colon = 2 (minor amount of                            residual staining, small fragments of stool and/or                            opaque liquid, but mucosa seen well). The total                            BBPS score equals 6. Fair. Scope In: 8:34:13 AM Scope Out: 9:00:24 AM Scope Withdrawal Time: 0 hours 19 minutes 13 seconds  Total Procedure Duration: 0 hours 26 minutes 11 seconds  Findings:      Non-bleeding internal hemorrhoids were found during endoscopy.      Many medium-mouthed and small-mouthed diverticula were found in the       sigmoid colon. There was no evidence of diverticular bleeding.       Angulated/tortuous sigmoid colon      Three sessile polyps were found in the sigmoid colon and descending       colon. The polyps were 4 to 6 mm in size. These polyps were removed with       a cold snare. Resection and retrieval were complete.      The exam was otherwise without abnormality.  A moderate amount of stool was found in the entire colon, making       visualization difficult. Lavage of the area was performed using copious       amounts of sterile water, resulting in clearance with fair visualization. Impression:               - Non-bleeding internal hemorrhoids.                           - Diverticulosis in the sigmoid colon. There was no                            evidence of diverticular bleeding.                           - Three 4 to 6 mm polyps in the sigmoid colon and                            in the descending colon, removed with a cold snare.                            Resected and  retrieved.                           - The examination was otherwise normal.                           - Stool in the entire examined colon. Moderate Sedation:      Per Anesthesia Care Recommendation:           - Patient has a contact number available for                            emergencies. The signs and symptoms of potential                            delayed complications were discussed with the                            patient. Return to normal activities tomorrow.                            Written discharge instructions were provided to the                            patient.                           - Resume previous diet.                           - Continue present medications.                           - Await pathology results.                           -  Repeat colonoscopy in 5 years for surveillance.                           - Return to GI clinic in 6 weeks. Procedure Code(s):        --- Professional ---                           [phone], Colonoscopy, flexible; with removal of                            tumor(s), polyp(s), or other lesion(s) by snare                            technique Diagnosis Code(s):        --- Professional ---                           K64.8, Other hemorrhoids                           D12.5, Benign neoplasm of sigmoid colon                           D12.4, Benign neoplasm of descending colon                           D50.9, Iron deficiency anemia, unspecified                           K57.30, Diverticulosis of large intestine without                            perforation or abscess without bleeding CPT copyright 2022 American Medical Association. All rights reserved. The codes documented in this report are preliminary and upon coder review may  be revised to meet current compliance requirements. Hennie Duos. Marletta Lor, DO Hennie Duos. Marletta Lor, DO 05/09/2023 9:05:35 AM This report has been signed electronically. Number of Addenda: 0

## 2023-05-09 NOTE — Assessment & Plan Note (Addendum)
Referred to allergy 05/08/2023   No obvious triggers/ risk factors > referred to RDS allergy and in meantime rec follow ER instructions esp regarding use of H1H2  (at least until needs to stop them to complete the allergy w/u)          Each maintenance medication was reviewed in detail including emphasizing most importantly the difference between maintenance and prns and under what circumstances the prns are to be triggered using an action plan format where appropriate.  Total time for H and P, chart review, counseling, reviewing hfa  device(s) and generating customized AVS unique to this office visit / same day charting = 20 min

## 2023-05-09 NOTE — Assessment & Plan Note (Addendum)
4-5 min discussion re active cigarette smoking in addition to office E&M  Ask about tobacco use:   ongoing  Advise quitting   pointed out that her breathing and wt loss issues as well as appetite may improve rapidly off all cigs and best thing she can do for her lung health Assess willingness:  Not fully committed at this point Assist in quit attempt:  Per PCP when ready Arrange follow up:   Follow up per Primary Care planned   F/u in 6 m with all meds in hand using a trust but verify approach to confirm accurate Medication  Reconciliation The principal here is that until we are certain that the  patients are doing what we've asked, it makes no sense to ask them to do more.

## 2023-05-09 NOTE — Anesthesia Postprocedure Evaluation (Signed)
Anesthesia Post Note  Patient: Candace Harris  Procedure(s) Performed: COLONOSCOPY WITH PROPOFOL ESOPHAGOGASTRODUODENOSCOPY (EGD) WITH PROPOFOL BALLOON DILATION BIOPSY POLYPECTOMY  Patient location during evaluation: PACU Anesthesia Type: General Level of consciousness: awake and alert Pain management: pain level controlled Vital Signs Assessment: post-procedure vital signs reviewed and stable Respiratory status: spontaneous breathing, nonlabored ventilation, respiratory function stable and patient connected to nasal cannula oxygen Cardiovascular status: blood pressure returned to baseline and stable Postop Assessment: no apparent nausea or vomiting Anesthetic complications: no   There were no known notable events for this encounter.   Last Vitals:  Vitals:   05/09/23 0718 05/09/23 0906  BP: 116/81 (!) 94/58  Pulse: 88 85  Resp: 16 18  Temp: 36.6 C (!) 36.3 C  SpO2: 100% 100%    Last Pain:  Vitals:   05/09/23 0906  TempSrc: Axillary  PainSc: 0-No pain                 Nikkie Liming L Janelly Switalski

## 2023-05-09 NOTE — Anesthesia Preprocedure Evaluation (Addendum)
Anesthesia Evaluation  Patient identified by MRN, date of birth, ID band Patient awake    Reviewed: Allergy & Precautions, H&P , NPO status , Patient's Chart, lab work & pertinent test results, reviewed documented beta blocker date and time   History of Anesthesia Complications (+) PONV and history of anesthetic complications  Airway Mallampati: II  TM Distance: >3 FB Neck ROM: full    Dental  (+) Edentulous Upper, Edentulous Lower   Pulmonary COPD,  oxygen dependent, Current Smoker and Patient abstained from smoking. History respiratory failure ( chronic )   Pulmonary exam normal breath sounds clear to auscultation       Cardiovascular Exercise Tolerance: Good hypertension, + CAD and + DOE  Normal cardiovascular exam Rhythm:regular Rate:Normal     Neuro/Psych  PSYCHIATRIC DISORDERS Anxiety Depression Bipolar Disorder   CVA    GI/Hepatic Neg liver ROS,GERD  Medicated,,  Endo/Other  diabetes, Type 2    Renal/GU negative Renal ROS  negative genitourinary   Musculoskeletal  (+) Arthritis , Osteoarthritis,  Fibromyalgia -  Abdominal   Peds  Hematology negative hematology ROS (+)   Anesthesia Other Findings   Reproductive/Obstetrics negative OB ROS                             Anesthesia Physical Anesthesia Plan  ASA: 4  Anesthesia Plan: General   Post-op Pain Management: Minimal or no pain anticipated   Induction: Intravenous  PONV Risk Score and Plan: Propofol infusion  Airway Management Planned: Nasal Cannula and Natural Airway  Additional Equipment: None  Intra-op Plan:   Post-operative Plan:   Informed Consent: I have reviewed the patients History and Physical, chart, labs and discussed the procedure including the risks, benefits and alternatives for the proposed anesthesia with the patient or authorized representative who has indicated his/her understanding and acceptance.      Dental Advisory Given  Plan Discussed with: CRNA  Anesthesia Plan Comments:         Anesthesia Quick Evaluation

## 2023-05-12 ENCOUNTER — Other Ambulatory Visit: Payer: Self-pay

## 2023-05-12 DIAGNOSIS — Z122 Encounter for screening for malignant neoplasm of respiratory organs: Secondary | ICD-10-CM

## 2023-05-12 DIAGNOSIS — Z87891 Personal history of nicotine dependence: Secondary | ICD-10-CM

## 2023-05-12 DIAGNOSIS — F17210 Nicotine dependence, cigarettes, uncomplicated: Secondary | ICD-10-CM

## 2023-05-12 LAB — SURGICAL PATHOLOGY

## 2023-05-15 ENCOUNTER — Encounter (HOSPITAL_COMMUNITY): Payer: Self-pay | Admitting: Internal Medicine

## 2023-05-22 ENCOUNTER — Encounter (HOSPITAL_COMMUNITY): Payer: Self-pay | Admitting: Radiology

## 2023-05-22 ENCOUNTER — Emergency Department (HOSPITAL_COMMUNITY)
Admission: EM | Admit: 2023-05-22 | Discharge: 2023-05-22 | Disposition: A | Discharge status: Home / Self Care | Payer: Medicare HMO | Attending: Emergency Medicine | Admitting: Emergency Medicine

## 2023-05-22 ENCOUNTER — Other Ambulatory Visit: Payer: Self-pay

## 2023-05-22 DIAGNOSIS — T783XXA Angioneurotic edema, initial encounter: Secondary | ICD-10-CM | POA: Insufficient documentation

## 2023-05-22 DIAGNOSIS — J45909 Unspecified asthma, uncomplicated: Secondary | ICD-10-CM | POA: Insufficient documentation

## 2023-05-22 DIAGNOSIS — I10 Essential (primary) hypertension: Secondary | ICD-10-CM | POA: Diagnosis not present

## 2023-05-22 DIAGNOSIS — Z79899 Other long term (current) drug therapy: Secondary | ICD-10-CM | POA: Insufficient documentation

## 2023-05-22 DIAGNOSIS — B370 Candidal stomatitis: Secondary | ICD-10-CM

## 2023-05-22 DIAGNOSIS — B379 Candidiasis, unspecified: Secondary | ICD-10-CM | POA: Insufficient documentation

## 2023-05-22 DIAGNOSIS — Z7951 Long term (current) use of inhaled steroids: Secondary | ICD-10-CM | POA: Insufficient documentation

## 2023-05-22 DIAGNOSIS — T7840XA Allergy, unspecified, initial encounter: Secondary | ICD-10-CM | POA: Insufficient documentation

## 2023-05-22 DIAGNOSIS — R220 Localized swelling, mass and lump, head: Secondary | ICD-10-CM | POA: Diagnosis present

## 2023-05-22 LAB — COMPREHENSIVE METABOLIC PANEL
ALT: 16 U/L (ref 0–44)
AST: 21 U/L (ref 15–41)
Albumin: 3.6 g/dL (ref 3.5–5.0)
Alkaline Phosphatase: 62 U/L (ref 38–126)
Anion gap: 13 (ref 5–15)
BUN: 7 mg/dL — ABNORMAL LOW (ref 8–23)
CO2: 27 mmol/L (ref 22–32)
Calcium: 9.7 mg/dL (ref 8.9–10.3)
Chloride: 97 mmol/L — ABNORMAL LOW (ref 98–111)
Creatinine, Ser: 0.54 mg/dL (ref 0.44–1.00)
GFR, Estimated: >60 mL/min (ref >60)
Glucose, Bld: 117 mg/dL — ABNORMAL HIGH (ref 70–99)
Potassium: 3.7 mmol/L (ref 3.5–5.1)
Sodium: 137 mmol/L (ref 135–145)
Total Bilirubin: 0.3 mg/dL (ref <1.2)
Total Protein: 7.9 g/dL (ref 6.5–8.1)

## 2023-05-22 LAB — CBC WITH DIFFERENTIAL/PLATELET
Abs Immature Granulocytes: 0.02 10*3/uL (ref 0.00–0.07)
Basophils Absolute: 0 10*3/uL (ref 0.0–0.1)
Basophils Relative: 0 %
Eosinophils Absolute: 0.1 10*3/uL (ref 0.0–0.5)
Eosinophils Relative: 1 %
HCT: 37.3 % (ref 36.0–46.0)
Hemoglobin: 11.8 g/dL — ABNORMAL LOW (ref 12.0–15.0)
Immature Granulocytes: 0 %
Lymphocytes Relative: 20 %
Lymphs Abs: 2.1 10*3/uL (ref 0.7–4.0)
MCH: 27.4 pg (ref 26.0–34.0)
MCHC: 31.6 g/dL (ref 30.0–36.0)
MCV: 86.5 fL (ref 80.0–100.0)
Monocytes Absolute: 0.5 10*3/uL (ref 0.1–1.0)
Monocytes Relative: 5 %
Neutro Abs: 7.8 10*3/uL — ABNORMAL HIGH (ref 1.7–7.7)
Neutrophils Relative %: 74 %
Platelets: 412 10*3/uL — ABNORMAL HIGH (ref 150–400)
RBC: 4.31 MIL/uL (ref 3.87–5.11)
RDW: 17.6 % — ABNORMAL HIGH (ref 11.5–15.5)
WBC: 10.4 10*3/uL (ref 4.0–10.5)
nRBC: 0 % (ref 0.0–0.2)

## 2023-05-22 LAB — SEDIMENTATION RATE: Sed Rate: 55 mm/h — ABNORMAL HIGH (ref 0–22)

## 2023-05-22 MED ORDER — FAMOTIDINE IN NACL 20-0.9 MG/50ML-% IV SOLN
20.0000 mg | Freq: Once | INTRAVENOUS | Status: AC
  Administered 2023-05-22: 20 mg via INTRAVENOUS
  Filled 2023-05-22: qty 50

## 2023-05-22 MED ORDER — DIPHENHYDRAMINE HCL 50 MG/ML IJ SOLN
25.0000 mg | Freq: Once | INTRAMUSCULAR | Status: AC
  Administered 2023-05-22: 25 mg via INTRAVENOUS
  Filled 2023-05-22: qty 1

## 2023-05-22 MED ORDER — PREDNISONE 20 MG PO TABS: 20.0000 mg | ORAL_TABLET | Freq: Every day | ORAL | 0 refills | Status: AC

## 2023-05-22 MED ORDER — METHYLPREDNISOLONE SODIUM SUCC 125 MG IJ SOLR
125.0000 mg | Freq: Once | INTRAMUSCULAR | Status: AC
  Administered 2023-05-22: 125 mg via INTRAVENOUS
  Filled 2023-05-22: qty 2

## 2023-05-22 MED ORDER — NYSTATIN 100000 UNIT/ML MT SUSP: 500000.0000 [IU] | Freq: Four times a day (QID) | OROMUCOSAL | 0 refills | Status: AC

## 2023-05-22 NOTE — ED Provider Notes (Signed)
[place] EMERGENCY DEPARTMENT AT [facility] Provider Note   CSN: [id number] Arrival date & time: 05/22/23  1101     History  Chief Complaint  Patient presents with   Oral Swelling    Adalyne Lovick is a 72 y.o. female.  HPI   This patient is a 72 year old female, she has a history of some reactive airway disease on oxygen, she also has a history of hypertension on amlodipine, high cholesterol on Crestor, she takes Lyrica and Zoloft as well.  She had been on losartan in the past but no longer takes it.  The patient presents today with a complaint of swelling of her tongue and her face.  She reports that she had had this in the past, in fact she states that she had been up here within the last couple of weeks and had been seen by her pulmonologist, the October 24 visit does reference some angioedema at that visit, the patient reports that she was having some swelling that seem to improve at that time.  She had done okay since then but woke up this morning feeling poorly, it was a generalized feeling that she cannot describe and states that it is since improved.  She states that she woke up and within a short timeframe her tongue was so large that she could not push it out of her mouth, she could not talk, she was having a hard time breathing, her lips were so swollen that she could see her own lips with her eyes without looking at the mirror, and she had some lower facial swelling as well.  She also reported having some itching and hives that have been intermittent over the last month.  She has been referred to an allergist but has not yet seen them.  She states that she did not come to the hospital immediately because the last time this occurred several weeks ago it improved spontaneously, she also noticed that today the swelling has improved but her tongue still feels enlarged and she still feels a dry mouth.  She had been on prednisone prior to her initial time with angioedema and feels  like she has some white buildup on her tongue and some discomfort.  At this time she has no trouble breathing no chest pain no belly pain no diarrhea and no swelling of her legs.  She denies being on an ACE inhibitor, she does have a prior history of an ACE inhibitor allergy with nausea and vomiting but no prior medication associated angioedema.  She only takes amlodipine for blood pressure at this time.  That is not a new medication for her.  No recent moves, no exposures to chemicals, animals, people or topical substances.  Home Medications Prior to Admission medications   Medication Sig Start Date End Date Taking? Authorizing Provider  nystatin (MYCOSTATIN) 100000 UNIT/ML suspension Take 5 mLs (500,000 Units total) by mouth 4 (four) times daily for 7 days. 05/22/23 05/29/23 Yes Eber Hong, MD  predniSONE (DELTASONE) 20 MG tablet Take 1 tablet (20 mg total) by mouth daily. 05/22/23 06/21/23 Yes Eber Hong, MD  acetaminophen (TYLENOL) 650 MG CR tablet Take 650 mg by mouth every 8 (eight) hours as needed for pain.    [provider]  albuterol (PROVENTIL) (2.5 MG/3ML) 0.083% nebulizer solution Take 3 mLs (2.5 mg total) by nebulization every 4 (four) hours as needed for wheezing or shortness of breath. 04/15/22   Nyoka Cowden, MD  albuterol (VENTOLIN HFA) 108 (90 Base)  MCG/ACT inhaler Inhale 2 puffs into the lungs every 4 (four) hours as needed for wheezing or shortness of breath. for wheezing 08/30/22   Nyoka Cowden, MD  amLODipine (NORVASC) 5 MG tablet Take 5 mg by mouth daily. 12/19/16   [provider]  baclofen (LIORESAL) 10 MG tablet Take 10 mg by mouth 2 (two) times daily. 01/27/23   [provider]  budesonide-formoterol (SYMBICORT) 160-4.5 MCG/ACT inhaler INHALE 2 PUFFS INTO THE LUNGS IN THE MORNING AND AT BEDTIME 05/06/23   Nyoka Cowden, MD  busPIRone (BUSPAR) 5 MG tablet Take 5 mg by mouth. TAKE ONE TABLET BY MOUTH TWICE DAILY @ 9AM & 5PM FOR 90 DAYS 04/21/23    [provider]  CAPLYTA 42 MG capsule Take 42 mg by mouth daily. 02/07/23   [provider]  cetirizine (ZYRTEC) 10 MG tablet Take 1 tablet by mouth daily. 04/29/23 05/29/23  [provider]  cyclobenzaprine (FLEXERIL) 10 MG tablet Take 10 mg by mouth 2 (two) times daily. 06/17/20   [provider]  dicyclomine (BENTYL) 20 MG tablet Take 20 mg by mouth 4 (four) times daily. 06/17/20   [provider]  famotidine (PEPCID) 20 MG tablet TAKE 1 TABLET DAILY AFTER SUPPER 03/31/23   Nyoka Cowden, MD  fluticasone (FLONASE) 50 MCG/ACT nasal spray Place 2 sprays into both nostrils daily.    [provider]  HYDROcodone-acetaminophen (NORCO) 10-325 MG tablet Take 1 tablet by mouth 3 (three) times daily as needed for pain. 01/25/22   [provider]  hydrOXYzine (ATARAX) 10 MG tablet Take 10 mg by mouth 2 (two) times daily as needed. 03/03/23   [provider]  latanoprost (XALATAN) 0.005 % ophthalmic solution 1 drop at bedtime. 03/31/23   [provider]  losartan (COZAAR) 50 MG tablet Take 50 mg by mouth every morning. 02/21/23   [provider]  megestrol (MEGACE) 20 MG tablet Take 20 mg by mouth daily.    [provider]  metFORMIN (GLUCOPHAGE) 500 MG tablet Take 500 mg by mouth 2 (two) times daily with a meal. 08/13/16   [provider]  naloxone (NARCAN) nasal spray 4 mg/0.1 mL Place 0.4 mg into the nose once.    [provider]  nitroGLYCERIN (NITROSTAT) 0.4 MG SL tablet Place 0.4 mg under the tongue every 5 (five) minutes as needed for chest pain. 06/17/20   [provider]  omeprazole (PRILOSEC) 40 MG capsule TAKE 1 CAPSULE 30 TO 60 MINUTES BEFORE FIRST MEAL OF THE DAY 04/15/22   Nyoka Cowden, MD  ondansetron (ZOFRAN) 4 MG tablet Take 4 mg by mouth daily. 11/11/19   [provider]  pregabalin (LYRICA) 25 MG capsule Take 25 mg by mouth 2 (two) times daily. 02/27/23    [provider]  rosuvastatin (CRESTOR) 20 MG tablet Take 20 mg by mouth at bedtime. 06/12/20   [provider]  sertraline (ZOLOFT) 100 MG tablet Take 100 mg by mouth every morning. 02/21/23   [provider]      Allergies    Alendronate, Ibuprofen, Ramipril, and Sulfabenzamide    Review of Systems   Review of Systems  All other systems reviewed and are negative.   Physical Exam Updated Vital Signs BP 110/78   Pulse 96   Temp 99.1 F (37.3 C) (Oral)   Resp (!) 24   Ht 1.549 m (5\' 1" )   Wt 43.1 kg   SpO2 94%   BMI 17.95 kg/m  Physical Exam Vitals and nursing note reviewed.  Constitutional:      General: She is not in acute distress.    Appearance: She is well-developed.  HENT:     Head: Normocephalic and atraumatic.     Mouth/Throat:     Pharynx: No oropharyngeal exudate.     Comments: There is some swelling of the left cheek, upper and lower lips are mildly swollen and the tongue is mild to moderately swollen and appears to be covered in thrush, oropharynx is patent and clear and phonation is normal, no trismus or torticollis Eyes:     General: No scleral icterus.       Right eye: No discharge.        Left eye: No discharge.     Conjunctiva/sclera: Conjunctivae normal.     Pupils: Pupils are equal, round, and reactive to light.  Neck:     Thyroid: No thyromegaly.     Vascular: No JVD.  Cardiovascular:     Rate and Rhythm: Normal rate and regular rhythm.     Heart sounds: Normal heart sounds. No murmur heard.    No friction rub. No gallop.  Pulmonary:     Effort: Pulmonary effort is normal. No respiratory distress.     Breath sounds: Normal breath sounds. No wheezing or rales.  Abdominal:     General: Bowel sounds are normal. There is no distension.     Palpations: Abdomen is soft. There is no mass.     Tenderness: There is no abdominal tenderness.  Musculoskeletal:        General: No tenderness. Normal range of motion.     Cervical  back: Normal range of motion and neck supple.  Lymphadenopathy:     Cervical: No cervical adenopathy.  Skin:    General: Skin is warm and dry.     Findings: No erythema or rash.  Neurological:     Mental Status: She is alert.     Coordination: Coordination normal.  Psychiatric:        Behavior: Behavior normal.     ED Results / Procedures / Treatments   Labs (all labs ordered are listed, but only abnormal results are displayed) Labs Reviewed  CBC WITH DIFFERENTIAL/PLATELET - Abnormal; Notable for the following components:      Result Value   Hemoglobin 11.8 (*)    RDW 17.6 (*)    Platelets 412 (*)    Neutro Abs 7.8 (*)    All other components within normal limits  COMPREHENSIVE METABOLIC PANEL - Abnormal; Notable for the following components:   Chloride 97 (*)    Glucose, Bld 117 (*)    BUN 7 (*)    All other components within normal limits  SEDIMENTATION RATE - Abnormal; Notable for the following components:   Sed Rate 55 (*)    All other components within normal limits    EKG EKG Interpretation Date/Time:  Thursday May 22 2023 12:07:35 EST Ventricular Rate:  96 PR Interval:  123 QRS Duration:  75 QT Interval:  346 QTC Calculation: 438 R Axis:   84  Text Interpretation: Sinus rhythm Borderline right axis deviation Nonspecific T abnormalities, lateral leads Confirmed by Eber Hong ([id number]) on 05/22/2023 1:04:09 PM  Radiology No results found.  Procedures Procedures    Medications Ordered in ED Medications  diphenhydrAMINE (BENADRYL) injection 25 mg (25 mg Intravenous Given 05/22/23 1214)  famotidine (PEPCID) IVPB 20 mg premix (0 mg Intravenous Stopped 05/22/23 1254)  methylPREDNISolone sodium succinate (SOLU-MEDROL)  125 mg/2 mL injection 125 mg (125 mg Intravenous Given 05/22/23 1213)    ED Course/ Medical Decision Making/ A&P                                 Medical Decision Making Amount and/or Complexity of Data Reviewed Labs:  ordered. ECG/medicine tests: ordered.  Risk Prescription drug management.    This patient presents to the ED for concern of angioedema and swelling, this involves an extensive number of treatment options, and is a complaint that carries with it a high risk of complications and morbidity.  The differential diagnosis includes angioedema, allergic reaction, could be an underlying systemic process, will obtain labs and a sed rate, medications will be given to treat more aggressively angioedema.  She no longer takes angiotensin receptor blocker or ACE inhibitor   Co morbidities that complicate the patient evaluation  Allergic reaction over the last month to some degree   Additional history obtained:  Additional history obtained from medical record External records from outside source obtained and reviewed including pulmonology notes   Lab Tests:  I Ordered, and personally interpreted labs.  The pertinent results include: Unremarkable except for the sed rate which is over 50   Cardiac Monitoring: / EKG:  The patient was maintained on a cardiac monitor.  I personally viewed and interpreted the cardiac monitored which showed an underlying rhythm of: The patient was given cardiac monitoring with normal sinus rhythm, no fever, no hypotension   Given phone number for outpatient allergy  Problem List / ED Course / Critical interventions / Medication management  Patient had no significant decompensation in fact her angioedema gradually improved until she was essentially asymptomatic at discharge with no swelling of the tongue or the lips or the face.  She was given prescriptions for nystatin and prednisone encouraged to follow-up outpatient, given allergy information for follow-up.  She is agreeable.  Stable for discharge without hypoxia at this time. I have reviewed the patients home medicines and have made adjustments as needed   Social Determinants of Health:  On oxygen   Test /  Admission - Considered:  Considered admission but the patient improved significantly and is stable for discharge, she understands indications for return         Final Clinical Impression(s) / ED Diagnoses Final diagnoses:  Angioedema, initial encounter  Thrush  Allergic reaction, initial encounter    Rx / DC Orders ED Discharge Orders          Ordered    predniSONE (DELTASONE) 20 MG tablet  Daily       Note to Pharmacy: 20mg  PO X 2 weeks, then 10mg  PO X 2 weeks, Disp QS   05/22/23 1431    nystatin (MYCOSTATIN) 100000 UNIT/ML suspension  4 times daily        05/22/23 1433              Eber Hong, MD 05/22/23 1434

## 2023-05-22 NOTE — ED Triage Notes (Signed)
Pt states her lips and tongue were swollen when she woke this morning at 7am. Pt with history of angioedema. Pt endorses she has had hives and itching off and on for the past two weeks. Pt notified her provider last week and was given steroids taper dose pack for 6 days and zantac which the patient says helped somewhat but she still had swelling.

## 2023-05-22 NOTE — Discharge Instructions (Addendum)
If you do develop severe or worsening swelling of the face or the tongue or any other severe or worsening symptoms including difficulty breathing please return to the ER immediately.  I have prescribed prednisone for you to take at home, make sure that you take it exactly as prescribed and make sure that you are seeing the allergy specialist within the next 2 weeks.  Nystatin mouthwas 4 times daily for 7 days

## 2023-05-22 NOTE — ED Triage Notes (Signed)
Pt without difficulty breathing.

## 2023-05-22 NOTE — ED Notes (Signed)
Introduced self to pt Attached to partial monitor Pt on 2LPM O2 at all times Pt complains of tongue and lip swelling Noted that tongue is swollen Pt stated swelling went down since this morning Pt stated that her tongue was "so big I couldn't get it out of my mouth"  Pt denies breathing difficulty or difficulty swallowing

## 2023-06-06 ENCOUNTER — Ambulatory Visit: Payer: Medicare HMO | Admitting: Internal Medicine

## 2023-06-19 ENCOUNTER — Ambulatory Visit (HOSPITAL_COMMUNITY)
Admission: RE | Admit: 2023-06-19 | Discharge: 2023-06-19 | Disposition: A | Discharge status: Home / Self Care | Payer: Medicare HMO | Source: Ambulatory Visit | Attending: Acute Care | Admitting: Acute Care

## 2023-06-19 DIAGNOSIS — Z87891 Personal history of nicotine dependence: Secondary | ICD-10-CM | POA: Diagnosis present

## 2023-06-19 DIAGNOSIS — F17210 Nicotine dependence, cigarettes, uncomplicated: Secondary | ICD-10-CM | POA: Diagnosis present

## 2023-06-19 DIAGNOSIS — Z122 Encounter for screening for malignant neoplasm of respiratory organs: Secondary | ICD-10-CM | POA: Diagnosis present

## 2023-06-20 NOTE — Progress Notes (Unsigned)
GI Office Note    Referring Provider: Rebekah Chesterfield, NP Primary Care Physician:  Rebekah Chesterfield, NP Primary Gastroenterologist: Gerrit Friends.Rourk, MD  Date:  06/23/2023  ID:  Candace Harris, DOB [birth year], MRN [id number]   Chief Complaint   Chief Complaint  Patient presents with   Follow-up    Pt arrives for follow up. Pt does have some abdominal pain. Controlling nausea with meds. Pt states about 2 weeks ago she went to hospital and she was diagnosed with angioedema and inflammation all over body. Pt states her lip swelled and she developed hives all over body. Pt states she is still having hives. Pt was given prednisone for 14 days. Pt states she completed course and hives have came back. Pt also reports having productive cough. Pt states issues have been going on since May   History of Present Illness  Candace Harris is a 72 y.o. female with a history of COPD on home oxygen, diabetes, GERD, hypertension, stroke, anxiety/depression, CAD, polymyalgia rheumatica presenting today for follow-up.  Possible colonoscopy in 2015 at [facility].  No procedure report available.    EGD 03/13/22: -Slightly ringed esophagus s/p dilation and biopsy -Normal stomach -Normal duodenum -Esophageal biopsies unremarkable, negative for EOE   Labs 11/07/22: Hgb 8.5, MCV 72. CMP unremarkable.  Labs 12/02/22: Hgb 7.7, MCV 70. TSH normal. BMP unremarkable   OV 12/10/2022.  She reported worsening shortness of breath over the prior 3 weeks her oxygen level.  Admits to smoking about 5 cigarettes daily.  Denies any constipation or diarrhea.  Appetite has been poor for the last 2 years.  Needs to decrease in energy.  Reportedly her weight is fluctuated from 105-115 pounds.  Had recently started iron but vomited back up upper abdominal pain but had reported chronic tenderness for about 50 years.  Once daily that usually helps prevent significant vomiting.  Admitted to taking Mucinex and Coricidin to help breathing and  congestion.  Taking dicyclomine as needed for stomach pain as well as her PPI once daily. Scheduled for EGD and colonoscopy with Dr. Jena Gauss.  PPI increased to twice daily.  Requested prior colonoscopy records from [place].  Labs ordered including H/H and anemia panel.   Labs 01/13/23: Hemoglobin 10.2, iron saturation 5%, iron 20, ferritin 39.  Advised patient she needed to increase her iron tablet to daily.  Last office visit 03/06/2023.  Patient reported early satiety.  She reportedly ate a decent breakfast and then would have a sandwich for lunch.  For dinner she would eat something with greens and potatoes and sometimes cornbread.  For meats she will usually eat chicken or ham.  Does take Megace twice daily which she feels like does increase her appetite.  Uses Zofran once daily for nausea.  Usually uses dicyclomine twice daily for abdominal pain.  Also noted to be on hydrocodone 4 times a day for chronic pain.  Taking a stool softener daily and having daily bowel movements, stools occasionally hard.  Reported occasional pill dysphagia.  Advised to schedule EGD and colonoscopy for further evaluation of anemia.  Advised high-protein diet with Ensure supplementation.  If EGD unrevealing plan for CT of the abdomen pelvis to evaluate for weight loss.  Advised her to continue stool softener, Megace, PPI twice daily, and Zofran.  EGD 05/09/2023: -Small hiatal hernia -Gastritis s/p biopsy -Normal duodenum -Pathology negative for H. pylori.  Gastric mucosa with hyperemia  Colonoscopy 05/09/2023: -Nonbleeding internal hemorrhoids -Sigmoid diverticulosis -Three 4 to 6 mm polyps in the  descending and sigmoid colon -Stool in the entire examined colon -Polyps revealed to be tubular adenomas and hyperplastic -Repeat colonoscopy in 5 years  Low-dose chest CT for lung cancer screening 06/19/2023 -pending read.     Latest Ref Rng & Units 05/22/2023   12:13 PM 04/30/2023    4:08 PM 01/13/2023   11:32 AM  CBC   WBC 4.0 - 10.5 K/uL 10.4  14.6    Hemoglobin 12.0 - 15.0 g/dL 41.6  60.6  30.1   Hematocrit 36.0 - 46.0 % 37.3  38.3    Platelets 150 - 400 K/uL 412  311      Today: Reflux not as bad as it was but still.  Symptoms are still there - sometimes worse in the morning and something in the evening or at night. Unable to identify any food trigger. Continues to watch what she eats. Continue to take Zofran every morning and that is controlling her nausea very well. Has had a couple episodes of vomiting since her last visit - she vomited after eating a soup and/or [place] that her cousin cooked for her. Has only been tacking omeprazole once daily.   When pain occurs it hurts - epigastric region always sensitive. Stomach will cramp and wont let go for a little while. Might correlate to meals.   Bowel movements depends on her diet. Has a BM about 4 days of the week. Has to strain a little but not much. She was taking a stool softener. Has used miralax in the past.   Appetite a little better than it has been. No falls,. Has felt lightheaded form time to time. No falls.   She states feeling like she may be having some throat swelling again. No overt dysphagia, occasional globus sensation. When swelling is better this is not the case.   Has reported some angioedema and the whole bottom half of her face was swollen and then her tongue started swelling and she went to the ED. She is trying to watch what she eats. Had taken prednisone ofr 14 days and reports swelling came back. She states the ED doctor told her she would need to be on it for a while. PCP did not continue it. She was also given nystatin for thrush.    Wt Readings from Last 3 Encounters:  06/23/23 99 lb 4.8 oz (45 kg)  05/22/23 95 lb (43.1 kg)  05/09/23 95 lb (43.1 kg)  03/06/23 99 lb 01/13/23 112 lb  Current Outpatient Medications  Medication Sig Dispense Refill   acetaminophen (TYLENOL) 650 MG CR tablet Take 650 mg by mouth every 8  (eight) hours as needed for pain.     albuterol (PROVENTIL) (2.5 MG/3ML) 0.083% nebulizer solution Take 3 mLs (2.5 mg total) by nebulization every 4 (four) hours as needed for wheezing or shortness of breath. 1080 mL 3   albuterol (VENTOLIN HFA) 108 (90 Base) MCG/ACT inhaler Inhale 2 puffs into the lungs every 4 (four) hours as needed for wheezing or shortness of breath. for wheezing 18 g 1   amLODipine (NORVASC) 5 MG tablet Take 5 mg by mouth daily.     baclofen (LIORESAL) 10 MG tablet Take 10 mg by mouth 2 (two) times daily.     budesonide-formoterol (SYMBICORT) 160-4.5 MCG/ACT inhaler INHALE 2 PUFFS INTO THE LUNGS IN THE MORNING AND AT BEDTIME 10.2 g 6   busPIRone (BUSPAR) 5 MG tablet Take 5 mg by mouth. TAKE ONE TABLET BY MOUTH TWICE DAILY @ 9AM & 5PM  FOR 90 DAYS     CAPLYTA 42 MG capsule Take 42 mg by mouth daily.     cyclobenzaprine (FLEXERIL) 10 MG tablet Take 10 mg by mouth 2 (two) times daily.     dicyclomine (BENTYL) 20 MG tablet Take 20 mg by mouth 4 (four) times daily.     famotidine (PEPCID) 20 MG tablet TAKE 1 TABLET DAILY AFTER SUPPER 90 tablet 3   fluticasone (FLONASE) 50 MCG/ACT nasal spray Place 2 sprays into both nostrils daily.     HYDROcodone-acetaminophen (NORCO) 10-325 MG tablet Take 1 tablet by mouth 3 (three) times daily as needed for pain.     hydrOXYzine (ATARAX) 10 MG tablet Take 10 mg by mouth 2 (two) times daily as needed.     latanoprost (XALATAN) 0.005 % ophthalmic solution 1 drop at bedtime.     losartan (COZAAR) 50 MG tablet Take 50 mg by mouth every morning.     megestrol (MEGACE) 20 MG tablet Take 20 mg by mouth daily.     metFORMIN (GLUCOPHAGE) 500 MG tablet Take 500 mg by mouth 2 (two) times daily with a meal.     naloxone (NARCAN) nasal spray 4 mg/0.1 mL Place 0.4 mg into the nose once.     nitroGLYCERIN (NITROSTAT) 0.4 MG SL tablet Place 0.4 mg under the tongue every 5 (five) minutes as needed for chest pain.     omeprazole (PRILOSEC) 40 MG capsule TAKE 1  CAPSULE 30 TO 60 MINUTES BEFORE FIRST MEAL OF THE DAY 90 capsule 3   ondansetron (ZOFRAN) 4 MG tablet Take 4 mg by mouth daily.     pregabalin (LYRICA) 25 MG capsule Take 25 mg by mouth 2 (two) times daily.     rosuvastatin (CRESTOR) 20 MG tablet Take 20 mg by mouth at bedtime.     sertraline (ZOLOFT) 100 MG tablet Take 100 mg by mouth every morning.     cetirizine (ZYRTEC) 10 MG tablet Take 1 tablet by mouth daily.     No current facility-administered medications for this visit.    Past Medical History:  Diagnosis Date   Anxiety    Arthritis    Bipolar affective disorder (HCC)    CAD (coronary artery disease)    Chronic respiratory failure (HCC)    Complication of anesthesia    COPD (chronic obstructive pulmonary disease) (HCC)    Depression    DM (diabetes mellitus) (HCC)    Fibromyalgia    GERD (gastroesophageal reflux disease)    History of angioedema    HTN (hypertension)    Polymyalgia rheumatica (HCC)    PONV (postoperative nausea and vomiting)    Stroke [facility])     Past Surgical History:  Procedure Laterality Date   ABDOMINAL HYSTERECTOMY N/A    ANTERIOR FUSION CERVICAL SPINE  2012   C5-7   BACK SURGERY     BALLOON DILATION N/A 05/09/2023   Procedure: BALLOON DILATION;  Surgeon: Lanelle Bal, DO;  [facility];  Service: Endoscopy;  Laterality: N/A;   BIOPSY  03/13/2022   Procedure: BIOPSY;  Surgeon: Corbin Ade, MD;  [facility];  Service: Endoscopy;;   BIOPSY  05/09/2023   Procedure: BIOPSY;  Surgeon: Lanelle Bal, DO;  [facility];  Service: Endoscopy;;   COLONOSCOPY WITH PROPOFOL N/A 05/09/2023   Procedure: COLONOSCOPY WITH PROPOFOL;  Surgeon: Lanelle Bal, DO;  [facility];  Service: Endoscopy;  Laterality: N/A;  9:00 am, asa 3  ESOPHAGOGASTRODUODENOSCOPY (EGD) WITH PROPOFOL N/A 03/13/2022   Procedure: ESOPHAGOGASTRODUODENOSCOPY (EGD) WITH PROPOFOL;  Surgeon: Corbin Ade, MD;  [facility];  Service: Endoscopy;  Laterality: N/A;  12:30pm   ESOPHAGOGASTRODUODENOSCOPY (EGD) WITH PROPOFOL N/A 05/09/2023   Procedure: ESOPHAGOGASTRODUODENOSCOPY (EGD) WITH PROPOFOL;  Surgeon: Lanelle Bal, DO;  [facility];  Service: Endoscopy;  Laterality: N/A;   MALONEY DILATION N/A 03/13/2022   Procedure: Elease Hashimoto DILATION;  Surgeon: Corbin Ade, MD;  [facility];  Service: Endoscopy;  Laterality: N/A;   POLYPECTOMY  05/09/2023   Procedure: POLYPECTOMY;  Surgeon: Lanelle Bal, DO;  [facility];  Service: Endoscopy;;   SHOULDER SURGERY      Family History  Problem Relation Age of Onset   Hypertension Mother    Breast cancer Sister    Colon cancer Neg Hx     Allergies as of 06/23/2023 - Review Complete 06/23/2023  Allergen Reaction Noted   Alendronate Other (See Comments) 02/08/2014   Ibuprofen Nausea And Vomiting 11/04/2013   Ramipril Nausea And Vomiting and Other (See Comments) 11/04/2013   Sulfabenzamide Other (See Comments) 11/04/2013    Social History   Socioeconomic History   Marital status: Single    Spouse name: Not on file   Number of children: Not on file   Years of education: Not on file   Highest education level: Not on file  Occupational History   Not on file  Tobacco Use   Smoking status: Every Day    Current packs/day: 0.50    Average packs/day: 0.5 packs/day for 50.0 years (25.0 ttl pk-yrs)    Types: Cigarettes   Smokeless tobacco: Never  Vaping Use   Vaping status: Never Used  Substance and Sexual Activity   Alcohol use: Never   Drug use: Never   Sexual activity: Not Currently  Other Topics Concern   Not on file  Social History Narrative   Not on file   Social Determinants of Health   Financial Resource Strain: Not on file  Food Insecurity: Not on file  Transportation Needs: Not on file  Physical Activity: Not on file  Stress: Not on file  Social Connections: Not on file     Review of  Systems   Gen: + fatigue. Denies fever, chills, anorexia. Denies weakness, weight loss.  CV: Denies chest pain, palpitations, syncope, peripheral edema, and claudication. Resp: Denies dyspnea at rest, cough, wheezing, coughing up blood, and pleurisy. GI: See HPI Derm: Denies rash, itching, dry skin Psych: Denies depression, anxiety, memory loss, confusion. No homicidal or suicidal ideation.  Heme: Denies bruising, bleeding, and enlarged lymph nodes. + facial swelling.   Physical Exam   BP 109/72   Pulse (!) 104   Temp 98 F (36.7 C)   Ht 5\' 1"  (1.549 m)   Wt 99 lb 4.8 oz (45 kg)   BMI 18.76 kg/m   General:   Alert and oriented. No distress noted. Pleasant and cooperative.  Head:  Normocephalic and atraumatic. Eyes:  Conjuctiva clear without scleral icterus. Mouth:  Oral mucosa pink and moist.  Some mild erythema and white plaques at the posterior tongue. Lungs: On 2 L nasal cannula at baseline. Abdomen:  +BS, soft, non-distended. Ttp to epigastrium. No rebound or guarding. No HSM or masses noted. Rectal: deferred Msk:  Weak gait - using 2 wheel walker.  Extremities:  Without edema. Neurologic:  Alert and  oriented x4 Psych:  Alert and cooperative. Normal mood and  affect.  Assessment  Candace Harris is a 72 y.o. female with a history of COPD on home oxygen, diabetes, GERD, hypertension, stroke, anxiety/depression, CAD, polymyalgia rheumatica presenting today for follow up post procedures.   IDA: History of anemia.  Had decrease in her hemoglobin to 7.7 in May with improvement in July.  EGD and colonoscopy recently updated.  EGD with gastritis and duodenal diverticulum.  Colonoscopy with a few tubular adenomas and hyperplastic polyps removed.  She continues on daily iron therapy which I have recommended for her to continue.  Most recent hemoglobin stable in the 11-12 range.  She denies any melena or BRBPR.  Does have some occasional dizziness without syncope or falls, this could be  secondary to polypharmacy as well as her respiratory issues.  GERD, dysphagia, chronic epigastric pain, N/V: Currently only on omeprazole once daily and famotidine 20 mg nightly.  She continues to have some intermittent reflux symptoms therefore I recommended for her to increase her omeprazole to 40 mg twice daily and continuing famotidine for now.  EGD recently with gastritis, negative for H. pylori.  GERD diet reinforced.  Continues to use Zofran once daily for nausea and this is controlling her symptoms well.  Suspect some of her dysphagia secondary to her acid reflux as well as some potential swelling/allergic reaction of unknown etiology.  Weight loss, lack of appetite, early satiety: She has gained about 5 pound since her last visit.  Usually weight fluctuations between 105-215 pounds however her weight today is an improvement from last.  She does like her appetite is somewhat better but she is also continued on Megace.  Has had some difficulty given some recent angioedema with some mild dysphagia.  PCP has not been helping her with this per her report and she had improvement with prednisone therapy but have returned with some swelling after stopping.  Per her request this an allergy referral today.  No significant airway obstruction noted today.  For her weight loss I have recommended asked to evaluate for mesenteric ischemia with a CTA and she is in agreement.  Advised her to continue a high-protein/high-calorie diet and supplementing with Ensure or boost daily.  Constipation: Previously somewhat controlled with stool softeners, however recently has taken up to 4-day without producing a bowel movement.  On average she is having about 4 bowel movements per week but is having to strain some.  Advised MiraLAX every other day and she can increase to daily if needed.  Thrush: Presence of thrush in the ED earlier in November.  Continues to have some mild erythema and some mild white plaques to the  posterior tongue, nothing observed in the posterior oropharynx.  Will retreat with additional oral nystatin swish and swallow.  PLAN   CTA A/P to evaluate for etiology of weight loss and abdominal pain.  Continue daily iron therapy Increase omeprazole to 40 mg BID Continue Megace Continue Zofran daily as needed Continue high protein/high calorie diet Supplement diet with ensure/boost Start miralax a few times per week , increase to daily if needed.  Nystatin refilled for thrush. Allergy testing referral Follow-up 6 to 8 weeks.    Brooke Bonito, MSN, FNP-BC, AGACNP-BC [facility]

## 2023-06-23 ENCOUNTER — Ambulatory Visit (INDEPENDENT_AMBULATORY_CARE_PROVIDER_SITE_OTHER): Payer: Medicare HMO | Admitting: Gastroenterology

## 2023-06-23 ENCOUNTER — Encounter: Payer: Self-pay | Admitting: Gastroenterology

## 2023-06-23 ENCOUNTER — Telehealth: Payer: Self-pay | Admitting: *Deleted

## 2023-06-23 ENCOUNTER — Other Ambulatory Visit: Payer: Self-pay | Admitting: *Deleted

## 2023-06-23 VITALS — BP 109/72 | HR 104 | Temp 98.0°F | Ht 61.0 in | Wt 99.3 lb

## 2023-06-23 DIAGNOSIS — R634 Abnormal weight loss: Secondary | ICD-10-CM

## 2023-06-23 DIAGNOSIS — R1013 Epigastric pain: Secondary | ICD-10-CM

## 2023-06-23 DIAGNOSIS — G8929 Other chronic pain: Secondary | ICD-10-CM

## 2023-06-23 DIAGNOSIS — K5900 Constipation, unspecified: Secondary | ICD-10-CM | POA: Diagnosis not present

## 2023-06-23 DIAGNOSIS — R6881 Early satiety: Secondary | ICD-10-CM

## 2023-06-23 DIAGNOSIS — R1310 Dysphagia, unspecified: Secondary | ICD-10-CM

## 2023-06-23 DIAGNOSIS — R112 Nausea with vomiting, unspecified: Secondary | ICD-10-CM

## 2023-06-23 DIAGNOSIS — D509 Iron deficiency anemia, unspecified: Secondary | ICD-10-CM

## 2023-06-23 DIAGNOSIS — T783XXS Angioneurotic edema, sequela: Secondary | ICD-10-CM

## 2023-06-23 DIAGNOSIS — K219 Gastro-esophageal reflux disease without esophagitis: Secondary | ICD-10-CM

## 2023-06-23 DIAGNOSIS — B370 Candidal stomatitis: Secondary | ICD-10-CM

## 2023-06-23 MED ORDER — NYSTATIN 100000 UNIT/ML MT SUSP: 5.0000 mL | Freq: Four times a day (QID) | OROMUCOSAL | 0 refills | Status: AC

## 2023-06-23 MED ORDER — OMEPRAZOLE 40 MG PO CPDR: 40.0000 mg | DELAYED_RELEASE_CAPSULE | Freq: Two times a day (BID) | ORAL | 3 refills | Status: AC

## 2023-06-23 NOTE — Patient Instructions (Addendum)
For your upper abdominal pain and reflux: I am increasing your omeprazole to 40 mg twice daily. Follow a GERD diet:  Avoid fried, fatty, greasy, spicy, citrus foods. Avoid caffeine and carbonated beverages. Avoid chocolate. Try eating 4-6 small meals a day rather than 3 large meals. Do not eat within 3 hours of laying down. Prop head of bed up on wood or bricks to create a 6 inch incline. Continue Zofran daily.  For constipation: Start using MiraLAX 17 g (1 capful) every other day.  If this is helpful but still having some straining then increase to daily.  You can take either in the morning or at night, what ever you prefer.  You can mix this with any form of your choice. High fiber diet  For you appetite and weight loss: I am ordering a special CT scan of your belly to assess the blood supply and assess for any cause of your abdominal pain. Continue taking Megace Continue boost or Ensure at least once daily. Continue to follow a high-protein/high-calorie diet.  In regards to your tongue swelling and pain I will send an allergy referral for you.  I will also refill your nystatin for you to take for another 2 weeks.  Follow-up in 6 to 8 weeks.  It was a pleasure to see you today. I want to create trusting relationships with patients. If you receive a survey regarding your visit,  I greatly appreciate you taking time to fill this out on paper or through your MyChart. I value your feedback.  Brooke Bonito, MSN, FNP-BC, AGACNP-BC [facility]

## 2023-06-23 NOTE — Telephone Encounter (Signed)
PA approved via evicore CPT Code: 16109 Description: CT ANGIO ABD&PELV W/O&W/DYE Authorization Number: [id number] DOS:: 06/23/2023-12/20/2023   CTA scheduled for 1/8, arrival 4:45pm.

## 2023-06-23 NOTE — Telephone Encounter (Signed)
Spoke with pt and she is aware of CTA appt details.

## 2023-06-23 NOTE — Addendum Note (Signed)
Addended by: Aida Raider on: 06/23/2023 11:43 AM   Modules accepted: Orders

## 2023-07-04 ENCOUNTER — Other Ambulatory Visit: Payer: Self-pay | Admitting: Acute Care

## 2023-07-04 DIAGNOSIS — Z87891 Personal history of nicotine dependence: Secondary | ICD-10-CM

## 2023-07-04 DIAGNOSIS — Z122 Encounter for screening for malignant neoplasm of respiratory organs: Secondary | ICD-10-CM

## 2023-07-04 DIAGNOSIS — F17210 Nicotine dependence, cigarettes, uncomplicated: Secondary | ICD-10-CM

## 2023-07-08 ENCOUNTER — Other Ambulatory Visit: Payer: Self-pay | Admitting: Internal Medicine

## 2023-07-21 ENCOUNTER — Ambulatory Visit: Payer: Medicare HMO | Admitting: Internal Medicine

## 2023-07-23 ENCOUNTER — Ambulatory Visit (HOSPITAL_COMMUNITY): Payer: Medicare Other

## 2023-07-24 ENCOUNTER — Telehealth: Payer: Self-pay | Admitting: Internal Medicine

## 2023-07-24 NOTE — Telephone Encounter (Signed)
 Candace Harris from [facility] callling.  Refill for Incruse from June was denied and deemed inappropriate. He needs to know why. Was it changed to a different medication, sent to a different pharm...etc  Please call to advise so he can explain this to the PT.   [place]  [phone]

## 2023-07-28 NOTE — Telephone Encounter (Signed)
 Called Stu back at [facility] and confirmed per last ov Dr. Darlean has patient on Symbicort  not Incruse. NFN  05/08/2023  f/u ov/[place] office/Wert re: GOLD 1 copd/ 02 dep/ anemia  maint on symbicort  160  did not bring meds / still smoking / angioedema > ER better on steroids/ h1 and h2      Chief Complaint  Patient presents with   COPD      GOLD I   Dyspnea:  shops at [employer] / crosses parking lot ok  Cough: not much  Sleeping: flat bed on side 3 pillows  s resp cc  SABA use: nt much  02: 2lpm hs / 2lpm daytime but 3 lpm walking > 95%

## 2023-08-04 ENCOUNTER — Ambulatory Visit: Payer: Medicare Other | Admitting: Internal Medicine

## 2023-08-18 ENCOUNTER — Ambulatory Visit: Payer: Medicare HMO | Admitting: Gastroenterology

## 2023-08-18 NOTE — Progress Notes (Deleted)
 GI Office Note    Referring Provider: Renato Dorothey HERO, NP Primary Care Physician:  Renato Dorothey HERO, NP Primary Gastroenterologist: Lamar HERO.Rourk, MD  Date:  08/18/2023  ID:  Candace Harris, DOB [birth year], MRN [id number]   Chief Complaint   No chief complaint on file.  History of Present Illness  Candace Harris is a 73 y.o. female with a history of COPD on home oxygen , diabetes, GERD, hypertension, stroke, anxiety/depression, CAD, polymyalgia rheumatica presenting today with complaint of ***  Possible colonoscopy in 2015 at [facility].  No procedure report available.    EGD 03/13/22: -Slightly ringed esophagus s/p dilation and biopsy -Normal stomach -Normal duodenum -Esophageal biopsies unremarkable, negative for EOE   Labs 11/07/22: Hgb 8.5, MCV 72. CMP unremarkable.  Labs 12/02/22: Hgb 7.7, MCV 70. TSH normal. BMP unremarkable   OV 12/10/2022.  She reported worsening shortness of breath over the prior 3 weeks her oxygen  level.  Admits to smoking about 5 cigarettes daily.  Denies any constipation or diarrhea.  Appetite has been poor for the last 2 years.  Needs to decrease in energy.  Reportedly her weight is fluctuated from 105-115 pounds.  Had recently started iron but vomited back up upper abdominal pain but had reported chronic tenderness for about 50 years.  Once daily that usually helps prevent significant vomiting.  Admitted to taking Mucinex  and Coricidin to help breathing and congestion.  Taking dicyclomine  as needed for stomach pain as well as her PPI once daily. Scheduled for EGD and colonoscopy with Dr. Shaaron.  PPI increased to twice daily.  Requested prior colonoscopy records from [place].  Labs ordered including H/H and anemia panel.   Labs 01/13/23: Hemoglobin 10.2, iron saturation 5%, iron 20, ferritin 39.  Advised patient she needed to increase her iron tablet to daily.   OV 03/06/2023.  Patient reported early satiety.  She reportedly ate a decent breakfast and then would have  a sandwich for lunch.  For dinner she would eat something with greens and potatoes and sometimes cornbread.  For meats she will usually eat chicken or ham.  Does take Megace twice daily which she feels like does increase her appetite.  Uses Zofran  once daily for nausea.  Usually uses dicyclomine  twice daily for abdominal pain.  Also noted to be on hydrocodone  4 times a day for chronic pain.  Taking a stool softener daily and having daily bowel movements, stools occasionally hard.  Reported occasional pill dysphagia.  Advised to schedule EGD and colonoscopy for further evaluation of anemia.  Advised high-protein diet with Ensure supplementation.  If EGD unrevealing plan for CT of the abdomen pelvis to evaluate for weight loss.  Advised her to continue stool softener, Megace, PPI twice daily, and Zofran .   EGD 05/09/2023: -Small hiatal hernia -Gastritis s/p biopsy -Normal duodenum -Pathology negative for H. pylori.  Gastric mucosa with hyperemia   Colonoscopy 05/09/2023: -Nonbleeding internal hemorrhoids -Sigmoid diverticulosis -Three 4 to 6 mm polyps in the descending and sigmoid colon -Stool in the entire examined colon -Polyps revealed to be tubular adenomas and hyperplastic -Repeat colonoscopy in 5 years   Low-dose chest CT for lung cancer screening 06/19/2023: -***  Last office visit 06/23/23.Still with GERD symptoms. Zofran  daily in AM is helping with nausea. Omeprazole  once daily. BM every other day usually. Reported angioedema intermittently and was treated with prednisone . CTA A/P ordered, increase PPI to BID. Continue megace, high protein diet recommended. Start miralax a few times per week. Given nystatin  for thrush. Referral for  allergy testing given.      Latest Ref Rng & Units 05/22/2023   12:13 PM 04/30/2023    4:08 PM 01/13/2023   11:32 AM 12/10/2022    4:38 PM 12/02/2022   11:40 AM 03/08/2022    2:25 PM  CBC  WBC 4.0 - 10.5 K/uL 10.4  14.6   15.9  7.4  10.8   Hemoglobin 12.0 -  15.0 g/dL 88.1  87.6  89.7  8.3  7.7  9.6   Hematocrit 36.0 - 46.0 % 37.3  38.3   30.0  27.8  32.1   Platelets 150 - 400 K/uL 412  311   348  364  313     Iron/TIBC/Ferritin/ %Sat    Component Value Date/Time   IRON 20 (L) 01/13/2023 1132   TIBC 387 01/13/2023 1132   FERRITIN 39 01/13/2023 1132   IRONPCTSAT 5 (LL) 01/13/2023 1132     CTA A/P not yet performed, scheduled for 07/23/23.  Today:    Wt Readings from Last 5 Encounters:  06/23/23 99 lb 4.8 oz (45 kg)  05/22/23 95 lb (43.1 kg)  05/09/23 95 lb (43.1 kg)  05/08/23 96 lb (43.5 kg)  05/07/23 99 lb (44.9 kg)    Current Outpatient Medications  Medication Sig Dispense Refill   acetaminophen  (TYLENOL ) 650 MG CR tablet Take 650 mg by mouth every 8 (eight) hours as needed for pain.     albuterol  (PROVENTIL ) (2.5 MG/3ML) 0.083% nebulizer solution Take 3 mLs (2.5 mg total) by nebulization every 4 (four) hours as needed for wheezing or shortness of breath. 1080 mL 3   albuterol  (VENTOLIN  HFA) 108 (90 Base) MCG/ACT inhaler Inhale 2 puffs into the lungs every 4 (four) hours as needed for wheezing or shortness of breath. for wheezing 18 g 1   amLODipine  (NORVASC ) 5 MG tablet Take 5 mg by mouth daily.     baclofen (LIORESAL) 10 MG tablet Take 10 mg by mouth 2 (two) times daily.     budesonide -formoterol  (SYMBICORT ) 160-4.5 MCG/ACT inhaler INHALE 2 PUFFS INTO THE LUNGS IN THE MORNING AND AT BEDTIME 10.2 g 6   busPIRone  (BUSPAR ) 5 MG tablet Take 5 mg by mouth. TAKE ONE TABLET BY MOUTH TWICE DAILY @ 9AM & 5PM FOR 90 DAYS     CAPLYTA 42 MG capsule Take 42 mg by mouth daily.     cetirizine (ZYRTEC) 10 MG tablet Take 1 tablet by mouth daily.     cyclobenzaprine (FLEXERIL) 10 MG tablet Take 10 mg by mouth 2 (two) times daily.     dicyclomine  (BENTYL ) 20 MG tablet Take 20 mg by mouth 4 (four) times daily.     famotidine  (PEPCID ) 20 MG tablet TAKE 1 TABLET DAILY AFTER SUPPER 90 tablet 3   fluticasone  (FLONASE) 50 MCG/ACT nasal spray Place 2  sprays into both nostrils daily.     HYDROcodone -acetaminophen  (NORCO) 10-325 MG tablet Take 1 tablet by mouth 3 (three) times daily as needed for pain.     hydrOXYzine (ATARAX) 10 MG tablet Take 10 mg by mouth 2 (two) times daily as needed.     latanoprost  (XALATAN ) 0.005 % ophthalmic solution 1 drop at bedtime.     losartan (COZAAR) 50 MG tablet Take 50 mg by mouth every morning.     megestrol (MEGACE) 20 MG tablet Take 20 mg by mouth daily.     metFORMIN (GLUCOPHAGE) 500 MG tablet Take 500 mg by mouth 2 (two) times daily with a meal.  naloxone (NARCAN) nasal spray 4 mg/0.1 mL Place 0.4 mg into the nose once.     nitroGLYCERIN  (NITROSTAT ) 0.4 MG SL tablet Place 0.4 mg under the tongue every 5 (five) minutes as needed for chest pain.     omeprazole  (PRILOSEC) 40 MG capsule Take 1 capsule (40 mg total) by mouth 2 (two) times daily before a meal. TAKE 1 CAPSULE 30 TO 60 MINUTES BEFORE FIRST MEAL OF THE DAY 90 capsule 3   ondansetron  (ZOFRAN ) 4 MG tablet Take 4 mg by mouth daily.     pregabalin (LYRICA) 25 MG capsule Take 25 mg by mouth 2 (two) times daily.     rosuvastatin  (CRESTOR ) 20 MG tablet Take 20 mg by mouth at bedtime.     sertraline  (ZOLOFT ) 100 MG tablet Take 100 mg by mouth every morning.     No current facility-administered medications for this visit.    Past Medical History:  Diagnosis Date   Anxiety    Arthritis    Bipolar affective disorder (HCC)    CAD (coronary artery disease)    Chronic respiratory failure (HCC)    Complication of anesthesia    COPD (chronic obstructive pulmonary disease) (HCC)    Depression    DM (diabetes mellitus) (HCC)    Fibromyalgia    GERD (gastroesophageal reflux disease)    History of angioedema    HTN (hypertension)    Polymyalgia rheumatica (HCC)    PONV (postoperative nausea and vomiting)    Stroke [facility])     Past Surgical History:  Procedure Laterality Date   ABDOMINAL HYSTERECTOMY N/A    ANTERIOR FUSION CERVICAL SPINE  2012    C5-7   BACK SURGERY     BALLOON DILATION N/A 05/09/2023   Procedure: BALLOON DILATION;  Surgeon: Cindie Carlin POUR, DO;  [facility];  Service: Endoscopy;  Laterality: N/A;   BIOPSY  03/13/2022   Procedure: BIOPSY;  Surgeon: Shaaron Lamar HERO, MD;  [facility];  Service: Endoscopy;;   BIOPSY  05/09/2023   Procedure: BIOPSY;  Surgeon: Cindie Carlin POUR, DO;  [facility];  Service: Endoscopy;;   COLONOSCOPY WITH PROPOFOL  N/A 05/09/2023   Procedure: COLONOSCOPY WITH PROPOFOL ;  Surgeon: Cindie Carlin POUR, DO;  [facility];  Service: Endoscopy;  Laterality: N/A;  9:00 am, asa 3   ESOPHAGOGASTRODUODENOSCOPY (EGD) WITH PROPOFOL  N/A 03/13/2022   Procedure: ESOPHAGOGASTRODUODENOSCOPY (EGD) WITH PROPOFOL ;  Surgeon: Shaaron Lamar HERO, MD;  [facility];  Service: Endoscopy;  Laterality: N/A;  12:30pm   ESOPHAGOGASTRODUODENOSCOPY (EGD) WITH PROPOFOL  N/A 05/09/2023   Procedure: ESOPHAGOGASTRODUODENOSCOPY (EGD) WITH PROPOFOL ;  Surgeon: Cindie Carlin POUR, DO;  [facility];  Service: Endoscopy;  Laterality: N/A;   MALONEY DILATION N/A 03/13/2022   Procedure: AGAPITO DILATION;  Surgeon: Shaaron Lamar HERO, MD;  [facility];  Service: Endoscopy;  Laterality: N/A;   POLYPECTOMY  05/09/2023   Procedure: POLYPECTOMY;  Surgeon: Cindie Carlin POUR, DO;  [facility];  Service: Endoscopy;;   SHOULDER SURGERY      Family History  Problem Relation Age of Onset   Hypertension Mother    Breast cancer Sister    Colon cancer Neg Hx     Allergies as of 08/19/2023 - Review Complete 06/23/2023  Allergen Reaction Noted   Alendronate Other (See Comments) 02/08/2014   Ibuprofen Nausea And Vomiting 11/04/2013   Ramipril Nausea And Vomiting and Other (See Comments) 11/04/2013   Sulfabenzamide Other (See Comments) 11/04/2013  Social History   Socioeconomic History   Marital status: Single    Spouse name: Not on file   Number of  children: Not on file   Years of education: Not on file   Highest education level: Not on file  Occupational History   Not on file  Tobacco Use   Smoking status: Every Day    Current packs/day: 0.50    Average packs/day: 0.5 packs/day for 50.0 years (25.0 ttl pk-yrs)    Types: Cigarettes   Smokeless tobacco: Never  Vaping Use   Vaping status: Never Used  Substance and Sexual Activity   Alcohol use: Never   Drug use: Never   Sexual activity: Not Currently  Other Topics Concern   Not on file  Social History Narrative   Not on file   Social Drivers of Health   Financial Resource Strain: Medium Risk (08/13/2023)   Received [facility]   Overall Financial Resource Strain (CARDIA)    Difficulty of Paying Living Expenses: Somewhat hard  Food Insecurity: No Food Insecurity (08/13/2023)   Received [facility]   Hunger Vital Sign    Worried About Running Out of Food in the Last Year: Never true    Ran Out of Food in the Last Year: Never true  Transportation Needs: Unmet Transportation Needs (08/13/2023)   Received from [facility]   PRAPARE - Transportation    Lack of Transportation (Medical): Yes    Lack of Transportation (Non-Medical): No  Physical Activity: Not on file  Stress: Not on file  Social Connections: Not on file   Review of Systems   Gen: Denies fever, chills, anorexia. Denies fatigue, weakness, weight loss.  CV: Denies chest pain, palpitations, syncope, peripheral edema, and claudication. Resp: Denies dyspnea at rest, cough, wheezing, coughing up blood, and pleurisy. GI: See HPI Derm: Denies rash, itching, dry skin Psych: Denies depression, anxiety, memory loss, confusion. No homicidal or suicidal ideation.  Heme: Denies bruising, bleeding, and enlarged lymph nodes.  Physical Exam   There were no vitals taken for this visit.  General:   Alert and oriented. No distress noted. Pleasant and cooperative.  Head:  Normocephalic and  atraumatic. Eyes:  Conjuctiva clear without scleral icterus. Mouth:  Oral mucosa pink and moist. Good dentition. No lesions. Lungs:  Clear to auscultation bilaterally. No wheezes, rales, or rhonchi. No distress.  Heart:  S1, S2 present without murmurs appreciated.  Abdomen:  +BS, soft, non-tender and non-distended. No rebound or guarding. No HSM or masses noted. Rectal: *** Msk:  Symmetrical without gross deformities. Normal posture. Extremities:  Without edema. Neurologic:  Alert and  oriented x4 Psych:  Alert and cooperative. Normal mood and affect.  Assessment  Candace Harris is a 73 y.o. female with a history of COPD on home oxygen , diabetes, GERD, hypertension, stroke, anxiety/depression, CAD, polymyalgia rheumatica presenting today with ***  IDA:    GERD, dysphagia, chronic epigastric pain, N/V:    Weight loss, lack of appetite, early satiety:    Constipation:  PLAN   ***     Charmaine Melia, MSN, FNP-BC, AGACNP-BC [facility]

## 2023-08-19 ENCOUNTER — Encounter: Payer: Self-pay | Admitting: Gastroenterology

## 2023-08-19 ENCOUNTER — Ambulatory Visit: Payer: Medicare Other | Admitting: Gastroenterology

## 2023-08-20 NOTE — Discharge Summary (Signed)
 ------------------------------------------------------------------------------- Attestation signed by Bettejane Donnice Pastor, DO at 08/20/23 1335 Case discussed with FNP Pace.  Patient has been slowly improving from an acute COPD exacerbation secondary to rhinovirus.  Oxygen  levels have improved, and patient did well on 1 L via nasal cannula.  Patient wears 2 L at home already.  She did well doing a home O2 walk test.  She is stable for discharge, agree with discharge plan.   Donnice Bettejane, DO [facility] Hospitalist -------------------------------------------------------------------------------   Hospitalist Discharge Summary   Discharge date:   August 20, 2023 Length of stay:    LOS: 7 days    Discharge Service:   [facility] Hospitalists Discharge Attending Physician: Brutus Franco Shank, FNP Discharge to:    To Home with Home Health Condition at Discharge:  stable   Mental Status On day of Discharge:  The patient is Alert and oriented to PERSON The patient is Alert And oriented to TIME The patient is Alert and oriented to [facility] course based on timeline of significant events after admission (by date): 1/29 Admitted early morning and placed on BIPAP due to COPD exacerbation, sepsis.  Chest pain in the afternoon, stat troponins and EKG, IV Lasix.    1/31 Frequent, severe cough continues; mostly using 3L   ______________________________________  Admission HPI    Patient admitted on: 08/13/2023 12:53 AM  Patient admitted by: Thersia Roger, DO  CHIEF COMPLAINT:        Chief Complaint  Patient presents with  . Shortness of Breath     Day of admission HPI:   August 13, 2023 3:48 AM    Patient admitted on Home O2? - yes Patient on home anticoagulant? -  no Patient admitted with Chronic home foley catheter? - No Foley catheter placed or replaced by another service prior to admission? - No   Mental Status on Admission: The patient is Alert and oriented to  PERSON The patient is Alert And oriented to TIME The patient is Alert and oriented to LOCATION   This is a 73 y.o. 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  She required CPAP then BiPAP and was started on IV antibiotics, nebulizer treatments, IV fluid, electrolyte replacement.  Severe dyspnea at rest.   Today:  Pt is sitting up in bed resting.  Cough has improved.  She is wearing 1L of oxygen .  Feeling much better, feels she is ready for discharge.     Problem List, Assessment & Plan       ASSESSMENT & PLAN (In order of descending acuity) 73 y.o. 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, DM, GERD, FM,  depression, anxiety, PTSD now admitted with     Acute hypoxic respiratory failure 2/2 exacerbation of COPD, tobacco abuse, sepsis with organ failure, rhinovirus, dyspnea Originally admitted to ICU; changed to overflow 08/17/23 No longer needing BIPAP due to work of breathing; on 1L saturating upper 90s IV and inhaled Steroids, O2, nebs  Escalate antibiotics from Azithro/Ceftriaxone  (received 3 days) to Cefepime/Doxy day 5; WBC 25.3 today; (getting IV steroids), procalcitonin negative (0.07) Positive for rhinovirus CTA 1/29 ruled out PE; 2 view CXR repeated 2/1, showed no active disease Counseled regarding tobacco cessation, nicotine  patch available if needed *Sepsis on admission as evidenced by elevated white count 18.2, tachycardia up to 113 bpm, respiratory failure  requiring BiPAP, source lung Lasix 2/3 and 2/4 with good results Home with Prednisone  taper, nebulizer machine and medication, Augmentin X 7 days sent electronically to [facility] Case management to get follow up appt with pt pulmonologist   2.  Moderate hypokalemia K 3.5; resolved Mag 1.8   3.  History of DM2 Accuchecks achs with  SSI Hold metformin while inpatient Carb controlled diet  A1C 6.4, glucose 114-195 in the past 24 hours   4.  History of GERD Continue Pepcid , Prilosec   5.  History of depression, anxiety, PTSD Continue buspirone , sertraline  Calm and cooperative   6.  History of HTN Continue amlodipine , losartan BP stable   7.  History of HLD Continue statin   8.  Dysphagia Pt notes difficulty swallowing X 2 days No ST available Changed diet to soft and bite sized, aspiration precautions in place Recent EGD within the past year per pt Soft diet has helped this problem greatly for pt, she is swallowing pills well, RESOLVED   Anticoagulation;  Lovenox  Oxygen ; 1L oxygen  (wears 2L at home; discovered this today) IVF; none IV Antibx; Cefepime/Doxy day 5, discharge on Augmentin   Continue current treatment plan.  Pt is much improved, ready to go home.  Discussed discharge plan with pt's son by phone and with pt at bedside.  Discussed patient's plan with Dr. Bettejane who agrees with the plan.  Discharge home, resume oxygen  therapy from home.  Rx's and nebulizer sent to [facility].     ADDITIONAL PATIENT FINDINGS OR OBSERVATIONS   Physical Exam Vitals and nursing note reviewed.  Constitutional:      Appearance: She is ill-appearing.  HENT:     Head: Normocephalic.     Right Ear: External ear normal.     Left Ear: External ear normal.     Nose: Nose normal.     Mouth/Throat:     Mouth: Mucous membranes are moist.  Eyes:     Pupils: Pupils are equal, round, and reactive to light.  Cardiovascular:     Rate and Rhythm: Normal rate and regular rhythm.     Pulses: Normal pulses.     Heart sounds: Normal heart sounds.  Pulmonary:     Effort: Respiratory distress present with dyspnea even at rest.     Breath sounds: Wheezing present on expiration; no rhonchi or rales    Comments: 2L, saturating well (99%) Abdominal:     General: Bowel sounds are normal.     Palpations: Abdomen is soft.   Musculoskeletal:        General: Normal range of motion.     Cervical back: Neck supple.     Right lower leg: No edema.     Left lower leg: No edema.  Skin:    General: Skin is warm and dry.  Neurological:     General: No focal deficit present.     Mental Status: She is alert and oriented to person, place, and time.  Psychiatric:        Mood and Affect: Mood normal.    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 Loss: Clinical criterion not met Fat Loss: Moderate Muscle Loss: Moderate        CODE STATUS :                    Full Code   Patient discharged on Home O2? - yes Patient discharged on home anticoagulant? -  no Patient discharged with Chronic home foley catheter? - no  Time Spent on Discharge I spent greater than 30 minutes counseling and coordinating care for the discharge of this patient. The pt and her son and I discussed the importance of outpatient follow-up as well as concerning signs and symptoms that would require immediate evaluation by a medical professional. The aforementioned conversation participants understand  and they show insight. I did use teachback to ensure understanding. The above participant/s is aware that not following the discussed plan, recommendations, and follow up can lead to severe negative effects on the  patient's health, up to and including death.  Discharge Medications     Your Medication List     STOP taking these medications    dicyclomine  20 mg tablet Commonly known as: BENTYL        START taking these medications    amoxicillin-clavulanate 875-125 mg per tablet Commonly known as: AUGMENTIN Take 1 tablet by mouth two (2) times a day for 7 days.   guaiFENesin  600 mg 12 hr tablet Commonly known as: MUCINEX  Take 1 tablet (600 mg total) by mouth two (2) times a day for 10 days.   ipratropium 0.02 % nebulizer solution Commonly known as: ATROVENT Inhale 2.5 mL (500 mcg total) by nebulization every six (6) hours.   levalbuterol 1.25 mg/3 mL nebulizer solution Commonly known as: XOPENEX Inhale 3 mL (1.25 mg total) by nebulization every six (6) hours.   predniSONE  20 MG tablet Commonly known as: DELTASONE  Take 2 tablets (40 mg total) by mouth daily for 4 days, THEN 1 tablet (20 mg total) daily for 4 days, THEN 0.5 tablets (10 mg total) daily for 4 days. Start taking on: August 20, 2023       CONTINUE taking these medications    amlodipine  2.5 MG tablet Commonly known as: NORVASC  Take 1 tablet (2.5 mg total) by mouth daily.   busPIRone  5 MG tablet Commonly known as: BUSPAR  Take 1 tablet (5 mg total) by mouth two (2) times a day.   famotidine  20 MG tablet Commonly known as: PEPCID  Take 1 tablet (20 mg total) by mouth in the morning.   fluticasone  propionate 50 mcg/actuation nasal spray Commonly known as: FLONASE 2 sprays into each nostril daily.   losartan 25 MG tablet Commonly known as: COZAAR Take 1 tablet (25 mg total) by mouth daily.   megestrol 40 MG tablet Commonly known as: MEGACE Take 1 tablet (40 mg total) by mouth daily.   metFORMIN 500 MG tablet Commonly known as: GLUCOPHAGE Take 1 tablet (500 mg total) by mouth two (2) times a day.   omeprazole  40 MG capsule Commonly known as: PriLOSEC Take 1 capsule (40 mg total) by mouth daily.    rosuvastatin  20 MG tablet Commonly  known as: CRESTOR  Take 1 tablet (20 mg total) by mouth daily.   sertraline  100 MG tablet Commonly known as: ZOLOFT  Take 1 tablet (100 mg total) by mouth daily.   VENTOLIN  HFA 90 mcg/actuation inhaler Generic drug: albuterol  Inhale 2 puffs every four (4) hours as needed.       ____________________________________________  Discharge Instructions   Nutrition:                                  Diet Instructions     Discharge diet (specify)     Discharge Nutrition Therapy: Consistent Carb   Consistent Carb Level: Consistent Carb 60/60/60 (4/4/4)       Activity:                                   Activity Instructions     Activity as tolerated         Appointments:                          Follow Up:                              Follow Up instructions and Outpatient Referrals    Call MD for:  persistent nausea or vomiting     Call MD for:  severe uncontrolled pain     Call MD for: Temperature > 38.5 Celsius ( > 101.3 Fahrenheit)     Discharge instructions         Allergies   Allergies  Allergen Reactions  . Alendronic Acid Other (See Comments)    Sore muscles, burning in chest  . Ibuprofen     Other reaction(s): Unknown  . Ramipril Other (See Comments)  . Sulfabenzamide Other (See Comments)      Past Medical History   Past Medical History:  Diagnosis Date  . Anxiety   . DM type 2 with diabetic mixed hyperlipidemia (CMS-HCC)   . Moderate major depression (CMS-HCC)   . Pernicious anemia   . Primary osteoarthritis involving multiple joints   . PTSD (post-traumatic stress disorder)        Lab Results   Recent Labs    Units 08/14/23 0420 08/15/23 0757 08/16/23 0421 08/17/23 0445 08/18/23 0744 08/19/23 0415 08/20/23 0501  WBC 10*9/L 16.4* 22.7* 20.3* 17.6* 22.5* 20.8* 25.3*  HGB g/dL 87.7 87.6 87.8 87.6 87.5 12.6 13.3  HCT % 36.8 37.8 36.6 38.0 38.4 38.8 40.7  PLT 10*9/L 322 345 316 337 353 356 407    Recent Labs    Units 08/14/23 0420 08/15/23 0757 08/16/23 0421 08/17/23 0445 08/18/23 0744 08/19/23 0416 08/20/23 0501  NA mmol/L 142 141 143 144 145 144 147*  K mmol/L 4.2 4.3 4.1 4.1 4.0 4.0 3.5  CL mmol/L 104 103 103 103 104 103 105  CO2 mmol/L 30.7 31.0 31.6 34.2* 31.6 34.2* 32.7*  BUN mg/dL 12 20 14 15 14  22* 21*  CREATININE mg/dL 9.42* 9.31 9.36 9.24 9.40* 0.90 0.73  GLU mg/dL 864 875 836 787* 878 818* 134  CALCIUM  mg/dL 9.7 9.4 9.4 9.4 9.7 9.6 9.8  ALBUMIN  g/dL 3.3*  --   --   --   --  3.1*  --   PROT g/dL 7.6  --   --   --   --  8.1*  --   BILITOT mg/dL 0.3  --   --   --   --  0.2*  --   AST U/L 28  --   --   --   --  16  --   ALT U/L 19  --   --   --   --  32  --   ALKPHOS U/L 72  --   --   --   --  61  --   MG mg/dL 2.0 1.9 2.2 2.0  --  1.8 1.8   Recent Labs    Units 08/13/23 1804 08/13/23 2037  TROPONINI ng/L 17 16   No results for input(s): INR, LABPROT, APTT, DDIMER in the last 168 hours. No results for input(s): TSH, ETOH, ACETAMIN, SALICYLATE, FREET3, T4FREE, A1C, TTR, ESR, CRP, HSCRP, ANA, RF, VITAMINB12, FOLATE, IRON, LABIRON, TIBC, FERRITIN, RETIC, RETICCTPCT, C3, C4, LDH, HAPTM, URICACID, HEPP4, CEA, RAPSCRN, CDIFRPCR, CDIFFNAP1, HIV12SCRN, HIVCP, TBCELLQN in the last 168 hours.  Invalid input(s): HIVSCRN  No results for input(s): HEPAIGM, HEPBSAG, HEPBIGM, HEPCAB, MITOAB in the last 168 hours. No results for input(s): WBCUA, NITRITE, LEUKOCYTESUR, BACTERIA, RBCUA, BLOODU, GLUCOSEU, PROTEINUA, KETONESU in the last 168 hours. No results for input(s): KETUR, PREGTESTUR, PREGPOC, NAURINE, LABOSMO, OSMOFT, PROTEINUR, CREATUR, PCRATIOUR, OPIAU, BENZU, TRICYCLIC, PCPU, AMPHU, COCAU, CANNAU, BARBU, URPROTELEC in the last 168 hours. No results for input(s): FTYP1, WBCFLUID, FNEUT, LYMPHSFL, FMONO, EOSFL, RBCFL,  CLARITYFLUID, COLORFL in the last 168 hours. No results for input(s): O2SOUR, FIO2ART, PHART, PCO2ART, PO2ART, HCO3ART, O2SATART, BEART in the last 72 hours.  Imaging  XR Chest 2 views Result Date: 08/16/2023 CLINICAL DATA:  73 year old female with history of hypoxemia. EXAM: CHEST - 2 VIEW COMPARISON:  Chest x-ray 08/13/2023. FINDINGS: Lung volumes are normal. No consolidative airspace disease. No pleural effusions. No pneumothorax. No pulmonary nodule or mass noted. Pulmonary vasculature and the cardiomediastinal silhouette are within normal limits. Orthopedic fixation hardware in the lower cervical spine incidentally noted.   1. No radiographic evidence of acute cardiopulmonary disease. Electronically Signed   By: Toribio Aye M.D.   On: 08/16/2023 09:10   ECG 12 Lead Result Date: 08/15/2023 Sinus tachycardia Nonspecific ST and T wave abnormality Abnormal ECG When compared with ECG of 24-May-2018 12:10, Nonspecific T wave abnormality now evident in Inferior leads Nonspecific T wave abnormality now evident in Lateral leads Confirmed by Cherie Searle ([id number]) on 08/15/2023 7:00:48 PM  Echocardiogram W Colorflow Spectral Doppler Result Date: 08/15/2023 Patient Info Name:     KENNIDI YOSHIDA Age:     65 years DOB:     [birth year] Gender:     Female MRN:     [id number] Accession #:     [id number] [facility] Account #:     [id number] Ht:     155 cm Wt:     44 kg BSA:     1.36 m2 BP:     120 /     76 mmHg HR:     106 bpm Heart Rhythm:     Tachycardia Exam Date:     08/14/2023 10:07 AM Admit Date:     08/13/2023 Exam Type:     ECHOCARDIOGRAM W COLORFLOW SPECTRAL DOPPLER Technical Quality:     Fair Staff Sonographer:     Mannie Canton Supervising Physician:     Concha Lonni An MD Referring Physician:     Brutus Bryant Pace Study Info Indications      - dyspnea Procedure(s)   Complete two-dimensional, color flow  and Doppler transthoracic echocardiogram is performed. Summary   1. The  left ventricle is relatively small in size with normal wall thickness.   2. The left ventricular systolic function is normal, LVEF is visually estimated at > 55%.   3. The right ventricle is upper normal in size, with normal systolic function.   4. There are no significant valvular abnormalities. Left Ventricle   The left ventricle is relatively small in size with normal wall thickness. The left ventricular systolic function is normal, LVEF is visually estimated at > 55%. There is normal left ventricular diastolic function. Right Ventricle   The right ventricle is upper normal in size, with normal systolic function. Left Atrium   The left atrium is normal in size. Right Atrium   The right atrium is normal in size. Aortic Valve   The aortic valve is probably trileaflet with normal appearing leaflets with normal excursion. There is no significant aortic regurgitation. There is no evidence of a significant transvalvular gradient. Mitral Valve   The mitral valve leaflets are normal with normal leaflet mobility. There is no significant mitral valve regurgitation. Tricuspid Valve   The tricuspid valve leaflets are normal, with normal leaflet mobility. There is no significant tricuspid regurgitation. There is no pulmonary hypertension noted on this study. TR maximum velocity: 2.4 m/s  Estimated PASP: 25 mmHg. Pulmonic Valve   Pulmonary valve is not well visualized. There is no significant pulmonic regurgitation. There is no evidence of a significant transvalvular gradient. Aorta   The aorta is normal in size in the visualized segments. Inferior Vena Cava   IVC size and inspiratory change suggest normal right atrial pressure. (0-5 mmHg). Pericardium/Pleural   There is no pericardial effusion. Other Findings   Rhythm: Sinus Rhythm. Ventricles ---------------------------------------------------------------------- Name                                 Value        Normal  ---------------------------------------------------------------------- LV Dimensions 2D/MM ----------------------------------------------------------------------  IVS Diastolic Thickness (2D)                                0.8 cm       0.6-0.9 LVID Diastole (2D)                  3.6 cm       3.8-5.2  LVPW Diastolic Thickness (2D)                                0.9 cm       0.6-0.9 LVID Systole (2D)                   2.1 cm       2.2-3.5 LVOT Diameter                       1.4 cm               LV Mass Index (2D Cubed)           63 g/m2         43-95  Relative Wall Thickness (2D)  0.50        <=0.42 LV Function ---------------------------------------------------------------------- LV EF (4C MOD)                        68 %                LV Diastolic Volume Index (BP MOD)                        22.3 ml/m2     29.0-61.0 LV EF (BP MOD)                        70 %         54-74 RV Dimensions 2D/MM ----------------------------------------------------------------------  RV Basal Diastolic Dimension                           3.7 cm       2.5-4.1 TAPSE                               1.8 cm         >=1.7 Atria ---------------------------------------------------------------------- Name                                 Value        Normal ---------------------------------------------------------------------- LA Dimensions ---------------------------------------------------------------------- LA Dimension (2D)                   2.5 cm       2.7-3.8 LA Volume Index (4C A-L)        22.89 ml/m2               LA Volume Index (2C A-L)        14.09 ml/m2               LA Volume (BP MOD)                   25 ml               LA Volume Index (BP MOD)        17.95 ml/m2   16.00-34.00 RA Dimensions ---------------------------------------------------------------------- RA Area (4C)                      10.2 cm2        <=18.0 RA Area (4C) Index              7.5 cm2/m2               RA ESV Index (4C MOD)              16 ml/m2         15-27 Left Ventricular Outflow Tract ---------------------------------------------------------------------- Name                                 Value        Normal ---------------------------------------------------------------------- LVOT 2D ---------------------------------------------------------------------- LVOT Diameter                       1.4 cm               LVOT Area  1.5 cm2 Aortic Valve ---------------------------------------------------------------------- Name                                 Value        Normal ---------------------------------------------------------------------- AV Doppler ---------------------------------------------------------------------- AV Peak Velocity                   1.6 m/s               AV Peak Gradient                   10 mmHg               AV Mean Gradient                    7 mmHg               AV VTI                               31 cm Mitral Valve ---------------------------------------------------------------------- Name                                 Value        Normal ---------------------------------------------------------------------- MV Diastolic Function ---------------------------------------------------------------------- MV E Peak Velocity                 66 cm/s               MV A Peak Velocity                 81 cm/s               MV E/A                                 0.8               MV Annular TDI ---------------------------------------------------------------------- MV Septal e' Velocity             5.7 cm/s         >=8.0 MV E/e' (Septal)                      11.7               MV Lateral e' Velocity            9.9 cm/s        >=10.0 MV E/e' (Lateral)                      6.7               MV e' Average                     7.8 cm/s               MV E/e' (Average)                      9.2 Tricuspid Valve ---------------------------------------------------------------------- Name  Value        Normal ---------------------------------------------------------------------- TV Regurgitation Doppler ---------------------------------------------------------------------- TR Peak Velocity                   2.4 m/s               Estimated PAP/RSVP ---------------------------------------------------------------------- RA Pressure                         3 mmHg           <=5 RV Systolic Pressure               25 mmHg           <36 Aorta ---------------------------------------------------------------------- Name                                 Value        Normal ---------------------------------------------------------------------- Ascending Aorta ---------------------------------------------------------------------- Ao Root Diameter (2D)               2.6 cm               Ao Root Diam Index (2D)          1.9 cm/m2 Venous ---------------------------------------------------------------------- Name                                 Value        Normal ---------------------------------------------------------------------- IVC/SVC ---------------------------------------------------------------------- IVC Diameter (Exp 2D)               1.2 cm         <=2.1 Report Signatures Finalized by Concha Lonni An  MD on 08/15/2023 05:35 AM  CTA Chest W Contrast Result Date: 08/13/2023 CLINICAL DATA:  Shortness of breath.  Hypoxia and tachycardia. EXAM: CT ANGIOGRAPHY CHEST WITH CONTRAST TECHNIQUE: Multidetector CT imaging of the chest was performed using the standard protocol during bolus administration of intravenous contrast. Multiplanar CT image reconstructions and MIPs were obtained to evaluate the vascular anatomy. RADIATION DOSE REDUCTION: This exam was performed according to the departmental dose-optimization program which includes automated exposure control, adjustment of the mA and/or kV according to patient size and/or use of iterative reconstruction technique. CONTRAST:  60 cc Omnipaque  350 COMPARISON:  Chest CT dated 06/19/2023 and radiograph dated 07/24/2023. FINDINGS: Cardiovascular: There is no cardiomegaly or pericardial effusion. Mild atherosclerotic calcification of the thoracic aorta. No aneurysmal dilatation or dissection. There is mild dilatation of the main pulmonary trunk suggestive of pulmonary hypertension. Clinical correlation is recommended. No pulmonary artery embolus identified. Mediastinum/Nodes: No hilar or mediastinal adenopathy. The esophagus is grossly unremarkable. No mediastinal fluid collection. Lungs/Pleura: Background of emphysema. Linear atelectasis/scarring in the left upper lobe and lingula. A 6 x 9 mm nodular density in the superior segment of the right lower lobe, likely scarring. Attention on follow-up imaging recommended. No focal consolidation, pleural effusion, or pneumothorax. The central airways are patent. Upper Abdomen: No acute abnormality. Musculoskeletal: No acute osseous pathology. Review of the MIP images confirms the above findings.   1. No acute intrathoracic pathology. No CT evidence of pulmonary artery embolus. 2. Aortic Atherosclerosis (ICD10-I70.0) and Emphysema (ICD10-J43.9). Electronically Signed   By: Vanetta Chou M.D.   On: 08/13/2023 16:21   XR Chest Portable Result Date: 08/13/2023 CLINICAL DATA:  DYSPNEA EXAM: PORTABLE CHEST 1 VIEW COMPARISON:  Chest x-ray 04/30/2023 FINDINGS:  The heart and mediastinal contours are unchanged. No focal consolidation. Chronic coarsened interstitial markings with no overt pulmonary edema. No pleural effusion. No pneumothorax. No acute osseous abnormality.  Cervical spine surgical hardware.   No active disease with underlying chronic interstitial lung disease. Electronically Signed   By: Morgane  Naveau M.D.   On: 08/13/2023 01:36    Brutus FORBES Shank, FNP Hospitalist, [facility] 08/20/23, 11:41 AM

## 2023-09-05 ENCOUNTER — Ambulatory Visit: Payer: Medicare Other | Admitting: Allergy & Immunology

## 2023-09-10 ENCOUNTER — Ambulatory Visit (HOSPITAL_COMMUNITY): Payer: Medicare Other | Attending: Occupational Therapy | Admitting: Occupational Therapy

## 2023-09-17 NOTE — Progress Notes (Deleted)
 Candace Harris, female    DOB: 06-26-1951    MRN: [id number]   Brief patient profile:  73 yobf active smoker with GOLD I COPD by pfts 02/2019 transferred care to [facility] office 11/02/2020 p admit (see below)   From Dr Chestine Spore 11/24/19 Elderly BF with a history of tobacco abuse and COPD who presents for evaluation of dyspnea on exertion.   She was diagnosed with COPD about 2 years ago but has had progressively worsening shortness of breath over the last 2 years.  She also has cough, sputum production, wheezing, but dyspnea on exertion is her main complaint.  Her activity is fairly limited; she is only able to walk a few steps around her house without stopping.   She is on 3 L supplemental oxygen at home.  She was prescribed 2.5 L but had ongoing dyspnea on titrated up to 3 L.  Her home saturations are usually in the 90s, but sometimes drop into the 80s.  She is currently using her Trelegy inhaler daily with frequent albuterol.  She continues to smoke 0.25- 0.5 packs/day; she has smoked 0.5 ppd for the last 50 years.     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  Rec: trelegy Check echo: 12/15/19  G I diastolic dysfunction / no cor pulmonale    Stopped trelegy  mid Jan 2022 due irritated mouth   Admit date: 08/15/2020 Discharge date: 08/18/2020 Brief Hospitalization Summary: Please see all hospital notes, images, labs for full details of the hospitalization. ADMISSION HPI: Candace Harris is a 73 y.o. female with medical history significant for HTN, CAD, anxiety, COPD (on 3-6 L of O2 at home), T2DM, GERD and bipolar disorder who presents to the emergency department due to 61-month onset of shortness of breath that has been progressively worsening, she complained of decreased oral intake due to loss of taste, she also endorsed loss of smell and she states that she had diarrhea for 2 to 3 weeks last month which has resolved.  She states that she saw her PCP in [place] who prescribed prednisone  for her, but she has not yet filled the prescription.  She decided to go to the ED for further evaluation due to persistent worsening shortness of breath and concern for dehydration.   ED Course: In the emergency department, she was tachycardic and intermittently tachypneic. Work-up in the ED showed leukocytosis, thrombocytosis, hypokalemia, hyponatremia, BUN/creatinine 41/1.70 (baseline creatinine was 0.7). D-dimer 0.78. Chest x-ray was reflective of emphysema but showed no active cardiopulmonary disease Breathing treatment was provided, Solu-Medrol 125 Mg x1 was given, IV Ativan Librium x1 due to anxiety was given and patient was provided with 500 mL of IV NS. Hospitalist was asked to admit patient for further evaluation and management.   Hospital Course    Acute on chronic respiratory failure with hypoxia - secondary to COPD exacerbation.  Pt much improved after IV steroids, bronchodilators and supportive measures. .  Prednisone taper.   Hypomagnesemia - IV replacement given and repleted.  Hypokalemia - repleted.  Essential hypertension - continue home meds GERD - protonix for GI protection.  Type 2 DM - monitored and stable.   AKI - resolved after hydration.    Discharge Diagnoses:  Principal Problem:   Shortness of breath Active Problems:   AKI (acute kidney injury) (HCC)   Dehydration   Leukocytosis   Thrombocytosis   Essential hypertension   Elevated d-dimer   Hypokalemia   Anxiety   COPD  with acute exacerbation (HCC)   Hyperlipidemia   Diabetes mellitus (HCC)   GERD (gastroesophageal reflux disease)   CAD (coronary artery disease)        History of Present Illness  11/02/2020  Pulmonary/ 1st office eval/ T[facility] / [facility] GOLD I copd with component of UACS/ anxiety  Chief Complaint  Patient presents with   Follow-up    Productive cough with white phlegm since January 2022  Dyspnea:  Can do 17 steps at appt complex but has to stop at top to recover / last  shopping x 2 y prior to OV   Cough: hocking min white daytime off gerd rx  Sleep: on side flat bed s resp symptoms "as long as I have on my oxygen"  SABA use: avg 6-8 x per day / no maint rx  02 2lpm 24/7 rec Plan A = Automatic = Always=    Breztri Take 2 puffs first thing in am and then another 2 puffs about 12 hours later.  Work on inhaler technique: Plan B = Backup (to supplement plan A, not to replace it) Only use your albuterol inhaler as a rescue medication Try prilosec 40mg   Take 30-60 min before first meal of the day and Pepcid ac (famotidine) 20 mg one after supper  Until return  GERD diet  Please schedule a follow up office visit in 6 weeks, call sooner if needed with all medications /inhalers/ solutions in hand so we can verify exactly what you are taking. This includes all medications from all doctors and over the counters         01/13/2023  f/u ov/[place] office/Abhijay Morriss re: GOLD 1 copd  and anemia maint on symbicort 160/spiriva  did not  bring meds except hfa / still smoking  Chief Complaint  Patient presents with   Follow-up   Dyspnea:  same as last ov/ anemia has not been corrected but w/u in progress  Cough: varies min mucoid  Sleeping: flat bed 3 pillows  SABA use: p exercise only 02: 2.5 to 3lpm not titrating  Rec Plan A = Automatic = Always=    symbicort 160 and spiriva 2 puffs of each 1st thing in am and then another 2 puffs symbicort  12 hours later Work on inhaler technique:  Plan B = Backup (to supplement plan A, not to replace it) Only use your albuterol inhaler as a rescue medication Plan C = Crisis (instead of Plan B but only if Plan B stops working) - only use your albuterol nebulizer if you first try Plan B  Also  Ok to try albuterol 15 min before an activity (on alternating days between your inhaler/ nebulizer)  that you know would usually make you short of breath     Make sure you check your oxygen saturation  AT  your highest level of activity (not after  you stop)   to be sure it stays over 90%   Please schedule a follow up office visit in 3 months call sooner if needed with all medications /inhalers/ solutions in hand    05/08/2023  f/u ov/[place] office/Felton Buczynski re: GOLD 1 copd/ 02 dep/ anemia  maint on symbicort 160  did not bring meds / still smoking / angioedema > ER better on steroids/ h1 and h2  Chief Complaint  Patient presents with   COPD    GOLD I   Dyspnea:  shops at [employer] / crosses parking lot ok  Cough: not much  Sleeping: flat bed on  side 3 pillows  s resp cc  SABA use: nt much  02: 2lpm hs / 2lpm daytime but 3 lpm walking > 95% Lung cancer screening: not since 12/21/21 Rec No change in pulmonary medications  My office will be contacting you by phone for referral to Dr Dellis Anes   Please schedule a follow up visit in 6  months but call sooner if needed      09/18/2023  f/u ov/[place] office/Karaline Buresh re: GOLD 1 copd  maint on ***  No chief complaint on file.   Dyspnea:  *** Cough: *** Sleeping: ***   resp cc  SABA use: *** 02: ***  Lung cancer screening: ***   No obvious day to day or daytime variability or assoc excess/ purulent sputum or mucus plugs or hemoptysis or cp or chest tightness, subjective wheeze or overt sinus or hb symptoms.    Also denies any obvious fluctuation of symptoms with weather or environmental changes or other aggravating or alleviating factors except as outlined above   No unusual exposure hx or h/o childhood pna/ asthma or knowledge of premature birth.  Current Allergies, Complete Past Medical History, Past Surgical History, Family History, and Social History were reviewed in [employer] record.  ROS  The following are not active complaints unless bolded Hoarseness, sore throat, dysphagia, dental problems, itching, sneezing,  nasal congestion or discharge of excess mucus or purulent secretions, ear ache,   fever, chills, sweats, unintended wt loss or wt gain,  classically pleuritic or exertional cp,  orthopnea pnd or arm/hand swelling  or leg swelling, presyncope, palpitations, abdominal pain, anorexia, nausea, vomiting, diarrhea  or change in bowel habits or change in bladder habits, change in stools or change in urine, dysuria, hematuria,  rash, arthralgias, visual complaints, headache, numbness, weakness or ataxia or problems with walking or coordination,  change in mood or  memory.        No outpatient medications have been marked as taking for the 09/18/23 encounter (Appointment) with Nyoka Cowden, MD.              Past Medical History:  Diagnosis Date   Anxiety    Bipolar affective disorder [facility])    CAD (coronary artery disease)    Chronic respiratory failure (HCC)    COPD (chronic obstructive pulmonary disease) (HCC)    DM (diabetes mellitus) (HCC)    GERD (gastroesophageal reflux disease)    HTN (hypertension)    Polymyalgia rheumatica (HCC)           Objective:    Wt  09/18/2023            ***  05/08/2023       96  01/13/2023           112  12/02/2022        110  05/30/2022      107  04/23/2022      104  03/25/2022        103  09/24/2021        107 03/15/2021         107  12/14/20 107 lb 12.8 oz (48.9 kg)  11/02/20 112 lb 12.8 oz (51.2 kg)  08/15/20 132 lb 4.4 oz (60 kg)    Vital signs reviewed  09/18/2023  - Note at rest 02 sats  ***% on ***   General appearance:    ***   Mild barr***       Lab Results  Component Value Date  HGB 12.3 04/30/2023   HGB 10.2 (L) 01/13/2023   HGB 8.3 (L) 12/10/2022   HGB 7.7 (L) 12/02/2022           Assessment

## 2023-09-18 ENCOUNTER — Encounter: Payer: Self-pay | Admitting: Internal Medicine

## 2023-09-18 ENCOUNTER — Other Ambulatory Visit: Payer: Self-pay

## 2023-09-18 ENCOUNTER — Encounter (HOSPITAL_COMMUNITY): Payer: Self-pay | Admitting: Emergency Medicine

## 2023-09-18 ENCOUNTER — Emergency Department (HOSPITAL_COMMUNITY)
Admission: EM | Admit: 2023-09-18 | Discharge: 2023-09-18 | Disposition: A | Discharge status: Home / Self Care | Attending: Emergency Medicine | Admitting: Emergency Medicine

## 2023-09-18 ENCOUNTER — Inpatient Hospital Stay: Payer: Medicaid Other | Admitting: Internal Medicine

## 2023-09-18 DIAGNOSIS — R220 Localized swelling, mass and lump, head: Secondary | ICD-10-CM | POA: Diagnosis present

## 2023-09-18 DIAGNOSIS — T783XXA Angioneurotic edema, initial encounter: Secondary | ICD-10-CM | POA: Insufficient documentation

## 2023-09-18 LAB — CBC WITH DIFFERENTIAL/PLATELET
Abs Immature Granulocytes: 0.04 10*3/uL (ref 0.00–0.07)
Basophils Absolute: 0 10*3/uL (ref 0.0–0.1)
Basophils Relative: 0 %
Eosinophils Absolute: 0.1 10*3/uL (ref 0.0–0.5)
Eosinophils Relative: 0 %
HCT: 38.9 % (ref 36.0–46.0)
Hemoglobin: 12.1 g/dL (ref 12.0–15.0)
Immature Granulocytes: 0 %
Lymphocytes Relative: 17 %
Lymphs Abs: 2.1 10*3/uL (ref 0.7–4.0)
MCH: 27.8 pg (ref 26.0–34.0)
MCHC: 31.1 g/dL (ref 30.0–36.0)
MCV: 89.2 fL (ref 80.0–100.0)
Monocytes Absolute: 0.9 10*3/uL (ref 0.1–1.0)
Monocytes Relative: 7 %
Neutro Abs: 9.6 10*3/uL — ABNORMAL HIGH (ref 1.7–7.7)
Neutrophils Relative %: 76 %
Platelets: 324 10*3/uL (ref 150–400)
RBC: 4.36 MIL/uL (ref 3.87–5.11)
RDW: 16.6 % — ABNORMAL HIGH (ref 11.5–15.5)
WBC: 12.7 10*3/uL — ABNORMAL HIGH (ref 4.0–10.5)
nRBC: 0 % (ref 0.0–0.2)

## 2023-09-18 LAB — BASIC METABOLIC PANEL
Anion gap: 12 (ref 5–15)
BUN: 7 mg/dL — ABNORMAL LOW (ref 8–23)
CO2: 26 mmol/L (ref 22–32)
Calcium: 9.4 mg/dL (ref 8.9–10.3)
Chloride: 104 mmol/L (ref 98–111)
Creatinine, Ser: 0.5 mg/dL (ref 0.44–1.00)
GFR, Estimated: >60 mL/min (ref >60)
Glucose, Bld: 104 mg/dL — ABNORMAL HIGH (ref 70–99)
Potassium: 3.7 mmol/L (ref 3.5–5.1)
Sodium: 142 mmol/L (ref 135–145)

## 2023-09-18 MED ORDER — ALBUTEROL SULFATE HFA 108 (90 BASE) MCG/ACT IN AERS
INHALATION_SPRAY | RESPIRATORY_TRACT | Status: AC
  Filled 2023-09-18: qty 6.7

## 2023-09-18 MED ORDER — ALBUTEROL SULFATE HFA 108 (90 BASE) MCG/ACT IN AERS
2.0000 | INHALATION_SPRAY | RESPIRATORY_TRACT | Status: DC | PRN
  Administered 2023-09-18: 2 via RESPIRATORY_TRACT

## 2023-09-18 MED ORDER — PREDNISONE 10 MG PO TABS: 20.0000 mg | ORAL_TABLET | Freq: Every day | ORAL | 0 refills | Status: DC

## 2023-09-18 MED ORDER — FAMOTIDINE IN NACL 20-0.9 MG/50ML-% IV SOLN
20.0000 mg | Freq: Once | INTRAVENOUS | Status: AC
  Administered 2023-09-18: 20 mg via INTRAVENOUS
  Filled 2023-09-18: qty 50

## 2023-09-18 MED ORDER — DIPHENHYDRAMINE HCL 50 MG/ML IJ SOLN
25.0000 mg | Freq: Once | INTRAMUSCULAR | Status: AC
  Administered 2023-09-18: 25 mg via INTRAVENOUS
  Filled 2023-09-18: qty 1

## 2023-09-18 MED ORDER — METHYLPREDNISOLONE SODIUM SUCC 125 MG IJ SOLR
125.0000 mg | Freq: Once | INTRAMUSCULAR | Status: AC
  Administered 2023-09-18: 125 mg via INTRAVENOUS
  Filled 2023-09-18: qty 2

## 2023-09-18 MED ORDER — FAMOTIDINE 20 MG PO TABS: 20.0000 mg | ORAL_TABLET | Freq: Two times a day (BID) | ORAL | 0 refills | Status: DC

## 2023-09-18 MED ORDER — EPINEPHRINE 0.3 MG/0.3ML IJ SOAJ
0.3000 mg | Freq: Once | INTRAMUSCULAR | Status: AC
  Administered 2023-09-18: 0.3 mg via INTRAMUSCULAR
  Filled 2023-09-18: qty 0.3

## 2023-09-18 NOTE — ED Triage Notes (Addendum)
 Pt noticed this am around 6am that her throat and lips were swelling. Pt from home bib ems.  Was given 50mg  benadryl IV in route. Cbg 135.  A/o. Swelling noted to right jaw and right side of lips. Hx of this last year as well. Denies any new meds. Tongue is not swollen. Able to talk. Pt states can not swallow good. Edp at bedside.  Chronic 2.5L [place] 02 at home, continued in ED

## 2023-09-18 NOTE — Discharge Instructions (Signed)
 Take 25 mg of Benadryl every 6 hours for swelling to face.  Return to the emergency department if you get worse.  Follow-up with your family doctor next week

## 2023-09-18 NOTE — ED Provider Notes (Signed)
 [place] EMERGENCY DEPARTMENT AT [facility] Provider Note   CSN: [id number] Arrival date & time: 09/18/23  4098     History  Chief Complaint  Patient presents with   Oral Swelling    Candace Harris is a 73 y.o. female.  Patient presents with swelling to the right side of her face and right lower lip.  She has had angioedema before  The history is provided by the patient and medical records. No language interpreter was used.  Allergic Reaction Presenting symptoms: no difficulty breathing, no difficulty swallowing and no rash   Severity:  Mild Prior allergic episodes:  Unable to specify Context: not animal exposure   Relieved by:  Nothing Worsened by:  Nothing Ineffective treatments:  None tried      Home Medications Prior to Admission medications   Medication Sig Start Date End Date Taking? Authorizing Provider  famotidine (PEPCID) 20 MG tablet Take 1 tablet (20 mg total) by mouth 2 (two) times daily. 09/18/23  Yes Bethann Berkshire, MD  predniSONE (DELTASONE) 10 MG tablet Take 2 tablets (20 mg total) by mouth daily. 09/18/23  Yes Bethann Berkshire, MD  acetaminophen (TYLENOL) 650 MG CR tablet Take 650 mg by mouth every 8 (eight) hours as needed for pain.    [provider]  albuterol (PROVENTIL) (2.5 MG/3ML) 0.083% nebulizer solution Take 3 mLs (2.5 mg total) by nebulization every 4 (four) hours as needed for wheezing or shortness of breath. 04/15/22   Nyoka Cowden, MD  albuterol (VENTOLIN HFA) 108 (90 Base) MCG/ACT inhaler Inhale 2 puffs into the lungs every 4 (four) hours as needed for wheezing or shortness of breath. for wheezing 08/30/22   Nyoka Cowden, MD  amLODipine (NORVASC) 5 MG tablet Take 5 mg by mouth daily. 12/19/16   [provider]  baclofen (LIORESAL) 10 MG tablet Take 10 mg by mouth 2 (two) times daily. 01/27/23   [provider]  budesonide-formoterol (SYMBICORT) 160-4.5 MCG/ACT inhaler INHALE 2 PUFFS INTO THE LUNGS IN THE MORNING  AND AT BEDTIME 05/06/23   Nyoka Cowden, MD  busPIRone (BUSPAR) 5 MG tablet Take 5 mg by mouth. TAKE ONE TABLET BY MOUTH TWICE DAILY @ 9AM & 5PM FOR 90 DAYS 04/21/23   [provider]  CAPLYTA 42 MG capsule Take 42 mg by mouth daily. 02/07/23   [provider]  cetirizine (ZYRTEC) 10 MG tablet Take 1 tablet by mouth daily. 04/29/23 05/29/23  [provider]  cyclobenzaprine (FLEXERIL) 10 MG tablet Take 10 mg by mouth 2 (two) times daily. 06/17/20   [provider]  dicyclomine (BENTYL) 20 MG tablet Take 20 mg by mouth 4 (four) times daily. 06/17/20   [provider]  fluticasone (FLONASE) 50 MCG/ACT nasal spray Place 2 sprays into both nostrils daily.    [provider]  HYDROcodone-acetaminophen (NORCO) 10-325 MG tablet Take 1 tablet by mouth 3 (three) times daily as needed for pain. 01/25/22   [provider]  hydrOXYzine (ATARAX) 10 MG tablet Take 10 mg by mouth 2 (two) times daily as needed. 03/03/23   [provider]  latanoprost (XALATAN) 0.005 % ophthalmic solution 1 drop at bedtime. 03/31/23   [provider]  losartan (COZAAR) 50 MG tablet Take 50 mg by mouth every morning. 02/21/23   [provider]  megestrol (MEGACE) 20 MG tablet Take 20 mg by mouth daily.    [provider]  metFORMIN (GLUCOPHAGE) 500 MG tablet Take 500 mg by mouth  2 (two) times daily with a meal. 08/13/16   [provider]  naloxone (NARCAN) nasal spray 4 mg/0.1 mL Place 0.4 mg into the nose once.    [provider]  nitroGLYCERIN (NITROSTAT) 0.4 MG SL tablet Place 0.4 mg under the tongue every 5 (five) minutes as needed for chest pain. 06/17/20   [provider]  omeprazole (PRILOSEC) 40 MG capsule Take 1 capsule (40 mg total) by mouth 2 (two) times daily before a meal. TAKE 1 CAPSULE 30 TO 60 MINUTES BEFORE FIRST MEAL OF THE DAY 06/23/23   Aida Raider, NP  ondansetron (ZOFRAN) 4 MG tablet Take 4 mg  by mouth daily. 11/11/19   [provider]  pregabalin (LYRICA) 25 MG capsule Take 25 mg by mouth 2 (two) times daily. 02/27/23   [provider]  rosuvastatin (CRESTOR) 20 MG tablet Take 20 mg by mouth at bedtime. 06/12/20   [provider]  sertraline (ZOLOFT) 100 MG tablet Take 100 mg by mouth every morning. 02/21/23   [provider]      Allergies    Alendronate, Ibuprofen, Ramipril, and Sulfabenzamide    Review of Systems   Review of Systems  Constitutional:  Negative for appetite change and fatigue.  HENT:  Negative for congestion, ear discharge, sinus pressure and trouble swallowing.        Swelling to right side of face and right upper lip  Eyes:  Negative for discharge.  Respiratory:  Negative for cough.   Cardiovascular:  Negative for chest pain.  Gastrointestinal:  Negative for abdominal pain and diarrhea.  Genitourinary:  Negative for frequency and hematuria.  Musculoskeletal:  Negative for back pain.  Skin:  Negative for rash.  Neurological:  Negative for seizures and headaches.  Psychiatric/Behavioral:  Negative for hallucinations.     Physical Exam Updated Vital Signs BP 125/83   Pulse (!) 104   Temp 99.5 F (37.5 C) (Oral)   Resp (!) 22   SpO2 97%  Physical Exam Vitals and nursing note reviewed.  Constitutional:      Appearance: She is well-developed.  HENT:     Head: Normocephalic.     Comments: Swelling to right cheek area and right upper lip.  Oral pharynx is normal    Nose: Nose normal.  Eyes:     General: No scleral icterus.    Conjunctiva/sclera: Conjunctivae normal.  Neck:     Thyroid: No thyromegaly.  Cardiovascular:     Rate and Rhythm: Normal rate and regular rhythm.     Heart sounds: No murmur heard.    No friction rub. No gallop.  Pulmonary:     Breath sounds: No stridor. No wheezing or rales.  Chest:     Chest wall: No tenderness.  Abdominal:     General: There is no distension.     Tenderness: There  is no abdominal tenderness. There is no rebound.  Musculoskeletal:        General: Normal range of motion.     Cervical back: Neck supple.  Lymphadenopathy:     Cervical: No cervical adenopathy.  Skin:    Findings: No erythema or rash.  Neurological:     Mental Status: She is alert and oriented to person, place, and time.     Motor: No abnormal muscle tone.     Coordination: Coordination normal.  Psychiatric:        Behavior: Behavior normal.     ED Results / Procedures / Treatments  Labs (all labs ordered are listed, but only abnormal results are displayed) Labs Reviewed  CBC WITH DIFFERENTIAL/PLATELET - Abnormal; Notable for the following components:      Result Value   WBC 12.7 (*)    RDW 16.6 (*)    Neutro Abs 9.6 (*)    All other components within normal limits  BASIC METABOLIC PANEL - Abnormal; Notable for the following components:   Glucose, Bld 104 (*)    BUN 7 (*)    All other components within normal limits    EKG None  Radiology No results found.  Procedures Procedures    Medications Ordered in ED Medications  methylPREDNISolone sodium succinate (SOLU-MEDROL) 125 mg/2 mL injection 125 mg (125 mg Intravenous Given 09/18/23 0909)  famotidine (PEPCID) IVPB 20 mg premix (0 mg Intravenous Stopped 09/18/23 1034)  diphenhydrAMINE (BENADRYL) injection 25 mg (25 mg Intravenous Given 09/18/23 0908)  EPINEPHrine (EPI-PEN) injection 0.3 mg (0.3 mg Intramuscular Given 09/18/23 0906)  diphenhydrAMINE (BENADRYL) injection 25 mg (25 mg Intravenous Given 09/18/23 1112)    ED Course/ Medical Decision Making/ A&P   CRITICAL CARE Performed by: Bethann Berkshire Total critical care time: 35 minutes Critical care time was exclusive of separately billable procedures and treating other patients. Critical care was necessary to treat or prevent imminent or life-threatening deterioration. Critical care was time spent personally by me on the following activities: development of treatment  plan with patient and/or surrogate as well as nursing, discussions with consultants, evaluation of patient's response to treatment, examination of patient, obtaining history from patient or surrogate, ordering and performing treatments and interventions, ordering and review of laboratory studies, ordering and review of radiographic studies, pulse oximetry and re-evaluation of patient's condition.  Click here for ABCD2, HEART and other calculatorsREFRESH Note before signing :1}                              Medical Decision Making Amount and/or Complexity of Data Reviewed Labs: ordered.  Risk Prescription drug management.   Patient has angioedema that has improved with treatment in the emergency department she will follow-up with her PCP.  She already has an appointment with an allergist        Final Clinical Impression(s) / ED Diagnoses Final diagnoses:  Angioedema, initial encounter    Rx / DC Orders ED Discharge Orders          Ordered    predniSONE (DELTASONE) 10 MG tablet  Daily        09/18/23 1232    famotidine (PEPCID) 20 MG tablet  2 times daily        09/18/23 1232              Bethann Berkshire, MD 09/21/23 1149

## 2023-09-22 ENCOUNTER — Telehealth (HOSPITAL_COMMUNITY): Payer: Self-pay | Admitting: Occupational Therapy

## 2023-09-22 ENCOUNTER — Ambulatory Visit (HOSPITAL_COMMUNITY): Admitting: Occupational Therapy

## 2023-09-22 NOTE — Telephone Encounter (Signed)
 OT called and spoke with patient regarding her No Show for [facility] Evaluation on 09/22/23 at 8am. Pt reported that she was sick and would like to reschedule.   Trish Mage, OTR/L [facility] [phone]

## 2023-10-01 ENCOUNTER — Ambulatory Visit (HOSPITAL_COMMUNITY): Attending: Occupational Therapy | Admitting: Occupational Therapy

## 2023-10-02 ENCOUNTER — Telehealth (HOSPITAL_COMMUNITY): Payer: Self-pay | Admitting: Occupational Therapy

## 2023-10-02 NOTE — Telephone Encounter (Signed)
 OT attempted to call pt x2 regarding No Show to [facility] Evaluation on 10/01/23. The first time the line was busy, the 2nd time the phone rang but there was no voicemail set up. Clinic will call back as time allows to see if pt would like to reschedule her WC Unknown Jim, OTR/L [facility] [phone]

## 2023-10-08 ENCOUNTER — Ambulatory Visit: Payer: Medicare Other | Admitting: Allergy & Immunology

## 2023-10-19 NOTE — Progress Notes (Deleted)
 Candace Harris, female    DOB: May 13, 1951    MRN: [id number]   Brief patient profile:  36 yobf active smoker with GOLD I COPD by pfts 02/2019 transferred care to [facility] office 11/02/2020 p admit (see below)   From Dr Chestine Spore 11/24/19 Elderly BF with a history of tobacco abuse and COPD who presents for evaluation of dyspnea on exertion.   She was diagnosed with COPD about 2 years ago but has had progressively worsening shortness of breath over the last 2 years.  She also has cough, sputum production, wheezing, but dyspnea on exertion is her main complaint.  Her activity is fairly limited; she is only able to walk a few steps around her house without stopping.   She is on 3 L supplemental oxygen at home.  She was prescribed 2.5 L but had ongoing dyspnea on titrated up to 3 L.  Her home saturations are usually in the 90s, but sometimes drop into the 80s.  She is currently using her Trelegy inhaler daily with frequent albuterol.  She continues to smoke 0.25- 0.5 packs/day; she has smoked 0.5 ppd for the last 50 years.     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  Rec: trelegy Check echo: 12/15/19  G I diastolic dysfunction / no cor pulmonale    Stopped trelegy  mid Jan 2022 due irritated mouth   Admit date: 08/15/2020 Discharge date: 08/18/2020 Brief Hospitalization Summary: Please see all hospital notes, images, labs for full details of the hospitalization. ADMISSION HPI: Candace Harris is a 73 y.o. female with medical history significant for HTN, CAD, anxiety, COPD (on 3-6 L of O2 at home), T2DM, GERD and bipolar disorder who presents to the emergency department due to 60-month onset of shortness of breath that has been progressively worsening, she complained of decreased oral intake due to loss of taste, she also endorsed loss of smell and she states that she had diarrhea for 2 to 3 weeks last month which has resolved.  She states that she saw her PCP in [place] who prescribed prednisone  for her, but she has not yet filled the prescription.  She decided to go to the ED for further evaluation due to persistent worsening shortness of breath and concern for dehydration.   ED Course: In the emergency department, she was tachycardic and intermittently tachypneic. Work-up in the ED showed leukocytosis, thrombocytosis, hypokalemia, hyponatremia, BUN/creatinine 41/1.70 (baseline creatinine was 0.7). D-dimer 0.78. Chest x-ray was reflective of emphysema but showed no active cardiopulmonary disease Breathing treatment was provided, Solu-Medrol 125 Mg x1 was given, IV Ativan Librium x1 due to anxiety was given and patient was provided with 500 mL of IV NS. Hospitalist was asked to admit patient for further evaluation and management.   Hospital Course    Acute on chronic respiratory failure with hypoxia - secondary to COPD exacerbation.  Pt much improved after IV steroids, bronchodilators and supportive measures. .  Prednisone taper.   Hypomagnesemia - IV replacement given and repleted.  Hypokalemia - repleted.  Essential hypertension - continue home meds GERD - protonix for GI protection.  Type 2 DM - monitored and stable.   AKI - resolved after hydration.    Discharge Diagnoses:  Principal Problem:   Shortness of breath Active Problems:   AKI (acute kidney injury) (HCC)   Dehydration   Leukocytosis   Thrombocytosis   Essential hypertension   Elevated d-dimer   Hypokalemia   Anxiety   COPD  with acute exacerbation (HCC)   Hyperlipidemia   Diabetes mellitus (HCC)   GERD (gastroesophageal reflux disease)   CAD (coronary artery disease)        History of Present Illness  11/02/2020  Pulmonary/ 1st office eval/ Avenir Lozinski / [facility] GOLD I copd with component of UACS/ anxiety  Chief Complaint  Patient presents with   Follow-up    Productive cough with white phlegm since January 2022  Dyspnea:  Can do 17 steps at appt complex but has to stop at top to recover / last  shopping x 2 y prior to OV   Cough: hocking min white daytime off gerd rx  Sleep: on side flat bed s resp symptoms "as long as I have on my oxygen"  SABA use: avg 6-8 x per day / no maint rx  02 2lpm 24/7 rec Plan A = Automatic = Always=    Breztri Take 2 puffs first thing in am and then another 2 puffs about 12 hours later.  Work on inhaler technique: Plan B = Backup (to supplement plan A, not to replace it) Only use your albuterol inhaler as a rescue medication Try prilosec 40mg   Take 30-60 min before first meal of the day and Pepcid ac (famotidine) 20 mg one after supper  Until return  GERD diet  Please schedule a follow up office visit in 6 weeks, call sooner if needed with all medications /inhalers/ solutions in hand so we can verify exactly what you are taking. This includes all medications from all doctors and over the counters         01/13/2023  f/u ov/[place] office/Randee Upchurch re: GOLD 1 copd  and anemia maint on symbicort 160/spiriva  did not  bring meds except hfa / still smoking  Chief Complaint  Patient presents with   Follow-up   Dyspnea:  same as last ov/ anemia has not been corrected but w/u in progress  Cough: varies min mucoid  Sleeping: flat bed 3 pillows  SABA use: p exercise only 02: 2.5 to 3lpm not titrating  Rec Plan A = Automatic = Always=    symbicort 160 and spiriva 2 puffs of each 1st thing in am and then another 2 puffs symbicort  12 hours later Work on inhaler technique:  Plan B = Backup (to supplement plan A, not to replace it) Only use your albuterol inhaler as a rescue medication Plan C = Crisis (instead of Plan B but only if Plan B stops working) - only use your albuterol nebulizer if you first try Plan B  Also  Ok to try albuterol 15 min before an activity (on alternating days between your inhaler/ nebulizer)  that you know would usually make you short of breath     Make sure you check your oxygen saturation  AT  your highest level of activity (not after  you stop)   to be sure it stays over 90%   Please schedule a follow up office visit in 3 months call sooner if needed with all medications /inhalers/ solutions in hand    05/08/2023  f/u ov/[place] office/Tejon Gracie re: GOLD 1 copd/ 02 dep/ anemia  maint on symbicort 160  did not bring meds / still smoking / angioedema > ER better on steroids/ h1 and h2  Chief Complaint  Patient presents with   COPD    GOLD I   Dyspnea:  shops at [employer] / crosses parking lot ok  Cough: not much  Sleeping: flat bed on  side 3 pillows  s resp cc  SABA use: nt much  02: 2lpm hs / 2lpm daytime but 3 lpm walking > 95% Lung cancer screening: not since 12/21/21 Rec   Patient admitted on: 08/13/2023 12:53 AM   Chief Complaint  Patient presents with  Shortness of Breath  Day of admission HPI: August 13, 2023   Discharge  08/20/23   Patient admitted on Home O2? - yes Patient on home anticoagulant? - no Patient admitted with Chronic home foley catheter? - No Foley catheter placed or replaced by another service prior to admission? - No  Mental Status on Admission: The patient is Alert and oriented to PERSON The patient is Alert And oriented to TIME The patient is Alert and oriented to LOCATION  This is a 73 y.o. 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, DM, GERD, FM, depression, anxiety, PTSD presents to the emergency department for evaluation of one week of cough with progressively worsening SOB. She required CPAP then BiPAP and was started on IV antibiotics, nebulizer treatments, IV fluid, electrolyte replacement. Severe dyspnea at rest.  Acute hypoxic respiratory failure 2/2 exacerbation of COPD, tobacco abuse, sepsis with organ failure, rhinovirus, dyspnea Originally admitted to ICU; changed to overflow 08/17/23 No longer needing BIPAP due to work of breathing; on 1L saturating upper 90s IV and inhaled Steroids, O2, nebs  Escalate antibiotics from Azithro/Ceftriaxone (received 3 days) to  Cefepime/Doxy day 5; WBC 25.3 today; (getting IV steroids), procalcitonin negative (0.07) Positive for rhinovirus CTA 1/29 ruled out PE; 2 view CXR repeated 2/1, showed no active disease Counseled regarding tobacco cessation, nicotine patch available if needed *Sepsis on admission as evidenced by elevated white count 18.2, tachycardia up to 113 bpm, respiratory failure requiring BiPAP, source lung Lasix 2/3 and 2/4 with good results   AT d/c:  Pt is sitting up in bed resting. Cough has improved. She is wearing 1L of oxygen. Feeling much better, feels she is ready for discharge.   10/21/2023 post hosp f/u ov/[place] office/Paxtyn Boyar re: *** maint on ***  No chief complaint on file.   Dyspnea:  *** Cough: *** Sleeping: ***   resp cc  SABA use: *** 02: ***  Lung cancer screening: ***   No obvious day to day or daytime variability or assoc excess/ purulent sputum or mucus plugs or hemoptysis or cp or chest tightness, subjective wheeze or overt sinus or hb symptoms.    Also denies any obvious fluctuation of symptoms with weather or environmental changes or other aggravating or alleviating factors except as outlined above   No unusual exposure hx or h/o childhood pna/ asthma or knowledge of premature birth.  Current Allergies, Complete Past Medical History, Past Surgical History, Family History, and Social History were reviewed in [employer] record.  ROS  The following are not active complaints unless bolded Hoarseness, sore throat, dysphagia, dental problems, itching, sneezing,  nasal congestion or discharge of excess mucus or purulent secretions, ear ache,   fever, chills, sweats, unintended wt loss or wt gain, classically pleuritic or exertional cp,  orthopnea pnd or arm/hand swelling  or leg swelling, presyncope, palpitations, abdominal pain, anorexia, nausea, vomiting, diarrhea  or change in bowel habits or change in bladder habits, change in stools or change in  urine, dysuria, hematuria,  rash, arthralgias, visual complaints, headache, numbness, weakness or ataxia or problems with walking or coordination,  change in mood or  memory.        No  outpatient medications have been marked as taking for the 10/21/23 encounter (Appointment) with Nyoka Cowden, MD.               Past Medical History:  Diagnosis Date   Anxiety    Bipolar affective disorder [facility])    CAD (coronary artery disease)    Chronic respiratory failure (HCC)    COPD (chronic obstructive pulmonary disease) (HCC)    DM (diabetes mellitus) (HCC)    GERD (gastroesophageal reflux disease)    HTN (hypertension)    Polymyalgia rheumatica (HCC)           Objective:    Wt  10/21/2023            ***  05/08/2023       96  01/13/2023           112  12/02/2022        110  05/30/2022      107  04/23/2022      104  03/25/2022        103  09/24/2021        107 03/15/2021         107  12/14/20 107 lb 12.8 oz (48.9 kg)  11/02/20 112 lb 12.8 oz (51.2 kg)  08/15/20 132 lb 4.4 oz (60 kg)    Vital signs reviewed  10/21/2023  - Note at rest 02 sats  ***% on ***   General appearance:    ***    Mild barr ***           Assessment

## 2023-10-21 ENCOUNTER — Inpatient Hospital Stay: Payer: Medicare (Managed Care) | Admitting: Internal Medicine

## 2023-11-07 ENCOUNTER — Inpatient Hospital Stay: Payer: Medicare (Managed Care) | Admitting: Internal Medicine

## 2023-11-13 ENCOUNTER — Inpatient Hospital Stay: Payer: Medicare (Managed Care) | Admitting: Internal Medicine

## 2023-11-19 NOTE — Progress Notes (Deleted)
 Candace Harris, female    DOB: [birth year]    MRN: [id number]   Brief patient profile:  42 yobf active smoker with GOLD I COPD by pfts 02/2019 transferred care to [facility] office 11/02/2020 p admit (see below)   From Dr Fulton Job 11/24/19 Elderly BF with a history of tobacco abuse and COPD who presents for evaluation of dyspnea on exertion.   She was diagnosed with COPD about 2 years ago but has had progressively worsening shortness of breath over the last 2 years.  She also has cough, sputum production, wheezing, but dyspnea on exertion is her main complaint.  Her activity is fairly limited; she is only able to walk a few steps around her house without stopping.   She is on 3 L supplemental oxygen  at home.  She was prescribed 2.5 L but had ongoing dyspnea on titrated up to 3 L.  Her home saturations are usually in the 90s, but sometimes drop into the 80s.  She is currently using her Trelegy inhaler daily with frequent albuterol .  She continues to smoke 0.25- 0.5 packs/day; she has smoked 0.5 ppd for the last 50 years.     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  Rec: trelegy Check echo: 12/15/19  G I diastolic dysfunction / no cor pulmonale    Stopped trelegy  mid Jan 2022 due irritated mouth   Admit date: 08/15/2020 Discharge date: 08/18/2020 Brief Hospitalization Summary: Please see all hospital notes, images, labs for full details of the hospitalization. ADMISSION HPI: Candace Harris is a 73 y.o. female with medical history significant for HTN, CAD, anxiety, COPD (on 3-6 L of O2 at home), T2DM, GERD and bipolar disorder who presents to the emergency department due to 25-month onset of shortness of breath that has been progressively worsening, she complained of decreased oral intake due to loss of taste, she also endorsed loss of smell and she states that she had diarrhea for 2 to 3 weeks last month which has resolved.  She states that she saw her PCP in [place] who prescribed prednisone   for her, but she has not yet filled the prescription.  She decided to go to the ED for further evaluation due to persistent worsening shortness of breath and concern for dehydration.   ED Course: In the emergency department, she was tachycardic and intermittently tachypneic. Work-up in the ED showed leukocytosis, thrombocytosis, hypokalemia, hyponatremia, BUN/creatinine 41/1.70 (baseline creatinine was 0.7). D-dimer 0.78. Chest x-ray was reflective of emphysema but showed no active cardiopulmonary disease Breathing treatment was provided, Solu-Medrol  125 Mg x1 was given, IV Ativan  Librium x1 due to anxiety was given and patient was provided with 500 mL of IV NS. Hospitalist was asked to admit patient for further evaluation and management.   Hospital Course    Acute on chronic respiratory failure with hypoxia - secondary to COPD exacerbation.  Pt much improved after IV steroids, bronchodilators and supportive measures. .  Prednisone  taper.   Hypomagnesemia - IV replacement given and repleted.  Hypokalemia - repleted.  Essential hypertension - continue home meds GERD - protonix  for GI protection.  Type 2 DM - monitored and stable.   AKI - resolved after hydration.    Discharge Diagnoses:  Principal Problem:   Shortness of breath Active Problems:   AKI (acute kidney injury) (HCC)   Dehydration   Leukocytosis   Thrombocytosis   Essential hypertension   Elevated d-dimer   Hypokalemia   Anxiety   COPD  with acute exacerbation (HCC)   Hyperlipidemia   Diabetes mellitus (HCC)   GERD (gastroesophageal reflux disease)   CAD (coronary artery disease)        History of Present Illness  11/02/2020  Pulmonary/ 1st office eval/ Jenesis Suchy / [facility] GOLD I copd with component of UACS/ anxiety  Chief Complaint  Patient presents with   Follow-up    Productive cough with white phlegm since January 2022  Dyspnea:  Can do 17 steps at appt complex but has to stop at top to recover / last  shopping x 2 y prior to OV   Cough: hocking min white daytime off gerd rx  Sleep: on side flat bed s resp symptoms "as long as I have on my oxygen "  SABA use: avg 6-8 x per day / no maint rx  02 2lpm 24/7 rec Plan A = Automatic = Always=    Breztri  Take 2 puffs first thing in am and then another 2 puffs about 12 hours later.  Work on inhaler technique: Plan B = Backup (to supplement plan A, not to replace it) Only use your albuterol  inhaler as a rescue medication Try prilosec 40mg   Take 30-60 min before first meal of the day and Pepcid  ac (famotidine ) 20 mg one after supper  Until return  GERD diet  Please schedule a follow up office visit in 6 weeks, call sooner if needed with all medications /inhalers/ solutions in hand so we can verify exactly what you are taking. This includes all medications from all doctors and over the counters         01/13/2023  f/u ov/[place] office/Amellia Panik re: GOLD 1 copd  and anemia maint on symbicort  160/spiriva   did not  bring meds except hfa / still smoking  Chief Complaint  Patient presents with   Follow-up   Dyspnea:  same as last ov/ anemia has not been corrected but w/u in progress  Cough: varies min mucoid  Sleeping: flat bed 3 pillows  SABA use: p exercise only 02: 2.5 to 3lpm not titrating  Rec Plan A = Automatic = Always=    symbicort  160 and spiriva  2 puffs of each 1st thing in am and then another 2 puffs symbicort   12 hours later Work on inhaler technique:  Plan B = Backup (to supplement plan A, not to replace it) Only use your albuterol  inhaler as a rescue medication Plan C = Crisis (instead of Plan B but only if Plan B stops working) - only use your albuterol  nebulizer if you first try Plan B  Also  Ok to try albuterol  15 min before an activity (on alternating days between your inhaler/ nebulizer)  that you know would usually make you short of breath     Make sure you check your oxygen  saturation  AT  your highest level of activity (not after  you stop)   to be sure it stays over 90%   Please schedule a follow up office visit in 3 months call sooner if needed with all medications /inhalers/ solutions in hand    05/08/2023  f/u ov/[place] office/Renessa Wellnitz re: GOLD 1 copd/ 02 dep/ anemia  maint on symbicort  160  did not bring meds / still smoking / angioedema > ER better on steroids/ h1 and h2  Chief Complaint  Patient presents with   COPD    GOLD I   Dyspnea:  shops at [employer] / crosses parking lot ok  Cough: not much  Sleeping: flat bed on  side 3 pillows  s resp cc  SABA use: nt much  02: 2lpm hs / 2lpm daytime but 3 lpm walking > 95% Lung cancer screening: not since 12/21/21 Rec No change in pulmonary medications  My office will be contacting you by phone for referral to Dr Idolina Maker  > not done as of 11/20/2023   Please schedule a follow up visit in 6  months but call sooner if needed    Patient admitted on: 08/13/2023 12:53 AM   Chief Complaint  Patient presents with  Shortness of Breath  Day of admission HPI: August 13, 2023   Discharge  08/20/23   Patient admitted on Home O2? - yes Patient on home anticoagulant? - no Patient admitted with Chronic home foley catheter? - No Foley catheter placed or replaced by another service prior to admission? - No  Mental Status on Admission: The patient is Alert and oriented to PERSON The patient is Alert And oriented to TIME The patient is Alert and oriented to LOCATION  This is a 73 y.o. 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, DM, GERD, FM, depression, anxiety, PTSD presents to the emergency department for evaluation of one week of cough with progressively worsening SOB. She required CPAP then BiPAP and was started on IV antibiotics, nebulizer treatments, IV fluid, electrolyte replacement. Severe dyspnea at rest.  Acute hypoxic respiratory failure 2/2 exacerbation of COPD, tobacco abuse, sepsis with organ failure, rhinovirus, dyspnea Originally admitted to  ICU; changed to overflow 08/17/23 No longer needing BIPAP due to work of breathing; on 1L saturating upper 90s IV and inhaled Steroids, O2, nebs  Escalate antibiotics from Azithro/Ceftriaxone (received 3 days) to Cefepime/Doxy day 5; WBC 25.3 today; (getting IV steroids), procalcitonin negative (0.07) Positive for rhinovirus CTA 1/29 ruled out PE; 2 view CXR repeated 2/1, showed no active disease Counseled regarding tobacco cessation, nicotine patch available if needed *Sepsis on admission as evidenced by elevated white count 18.2, tachycardia up to 113 bpm, respiratory failure requiring BiPAP, source lung Lasix 2/3 and 2/4 with good results   AT d/c:  Pt is sitting up in bed resting. Cough has improved. She is wearing 1L of oxygen . Feeling much better, feels she is ready for discharge.     11/20/2023 post hosp f/u ov/[place] office/Tres Grzywacz re: *** maint on ***  ? Needs to see allergy *** No chief complaint on file.   Dyspnea:  *** Cough: *** Sleeping: ***   resp cc  SABA use: *** 02: ***  Lung cancer screening: ***   No obvious day to day or daytime variability or assoc excess/ purulent sputum or mucus plugs or hemoptysis or cp or chest tightness, subjective wheeze or overt sinus or hb symptoms.    Also denies any obvious fluctuation of symptoms with weather or environmental changes or other aggravating or alleviating factors except as outlined above   No unusual exposure hx or h/o childhood pna/ asthma or knowledge of premature birth.  Current Allergies, Complete Past Medical History, Past Surgical History, Family History, and Social History were reviewed in [employer] record.  ROS  The following are not active complaints unless bolded Hoarseness, sore throat, dysphagia, dental problems, itching, sneezing,  nasal congestion or discharge of excess mucus or purulent secretions, ear ache,   fever, chills, sweats, unintended wt loss or wt gain, classically  pleuritic or exertional cp,  orthopnea pnd or arm/hand swelling  or leg swelling, presyncope, palpitations, abdominal pain, anorexia, nausea, vomiting,  diarrhea  or change in bowel habits or change in bladder habits, change in stools or change in urine, dysuria, hematuria,  rash, arthralgias, visual complaints, headache, numbness, weakness or ataxia or problems with walking or coordination,  change in mood or  memory.        No outpatient medications have been marked as taking for the 11/20/23 encounter (Appointment) with Diamond Formica, MD.               Past Medical History:  Diagnosis Date   Anxiety    Bipolar affective disorder [facility])    CAD (coronary artery disease)    Chronic respiratory failure (HCC)    COPD (chronic obstructive pulmonary disease) (HCC)    DM (diabetes mellitus) (HCC)    GERD (gastroesophageal reflux disease)    HTN (hypertension)    Polymyalgia rheumatica (HCC)           Objective:    Wt  11/20/2023            ***  05/08/2023       96  01/13/2023           112  12/02/2022        110  05/30/2022      107  04/23/2022      104  03/25/2022        103  09/24/2021        107 03/15/2021         107  12/14/20 107 lb 12.8 oz (48.9 kg)  11/02/20 112 lb 12.8 oz (51.2 kg)  08/15/20 132 lb 4.4 oz (60 kg)    Vital signs reviewed  11/20/2023  - Note at rest 02 sats  ***% on ***   General appearance:    ***    Mild barr ***           Assessment

## 2023-11-20 ENCOUNTER — Encounter: Payer: Self-pay | Admitting: Internal Medicine

## 2023-11-20 ENCOUNTER — Inpatient Hospital Stay: Payer: Medicare (Managed Care) | Admitting: Internal Medicine

## 2023-11-24 ENCOUNTER — Telehealth: Payer: Self-pay

## 2023-11-24 ENCOUNTER — Other Ambulatory Visit (HOSPITAL_COMMUNITY): Payer: Self-pay

## 2023-11-24 MED ORDER — BUDESONIDE-FORMOTEROL FUMARATE 160-4.5 MCG/ACT IN AERO: 2.0000 | INHALATION_SPRAY | Freq: Two times a day (BID) | RESPIRATORY_TRACT | 11 refills | Status: DC

## 2023-11-24 NOTE — Telephone Encounter (Signed)
 Diamond Formica, MD to Me    11/24/23  3:01 PM Candace Harris  160  Take 2 puffs first thing in am and then another 2 puffs about 12 hours later is fine  I called and spoke with the pt and notified of response from Dr Waymond Hailey She verbalized understanding  Candace Harris  rx was sent to pharm  Nothing further needed

## 2023-11-24 NOTE — Telephone Encounter (Signed)
*  Pulm  Pharmacy Patient Advocate Encounter   Received notification from CoverMyMeds that prior authorization for Budesonide -Formoterol  Fumarate 160-4.5MCG/ACT aerosol  is required/requested.   Insurance verification completed.   The patient is insured through [facility] .   Per test claim:  Breyna  10.3gm is preferred by the insurance.  If suggested medication is appropriate, Please send in a new RX and discontinue this one. If not, please advise as to why it's not appropriate so that we may request a Prior Authorization. Please note, some preferred medications may still require a PA.  If the suggested medications have not been trialed and there are no contraindications to their use, the PA will not be submitted, as it will not be approved.   CMM Key: [id number]

## 2023-11-28 IMAGING — CT CT CHEST LUNG CANCER SCREENING LOW DOSE W/O CM
2 of 4 series · 15 of 36 positions shown, 18 images · non-contrast
Comparison: None Available.

CLINICAL DATA: 8-year-old female with 57 pack-year history of
smoking. Lung cancer screening.



[Series 4: lungs · axial · 0.61mm/px · z∈[-461,-157]mm · 12 of 336 slices shown, 15 images]
[im 16/336  mediastinal]
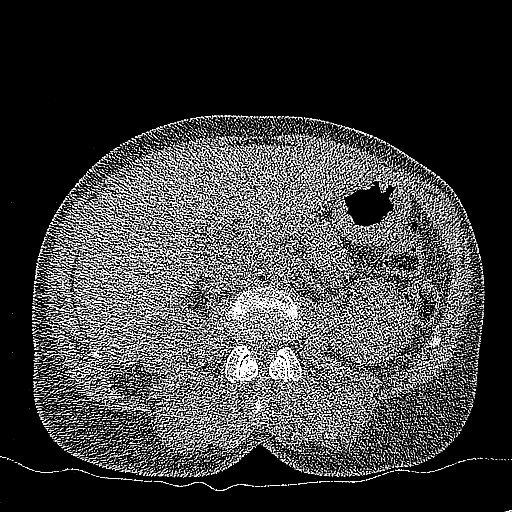
[im 16/336  lung]
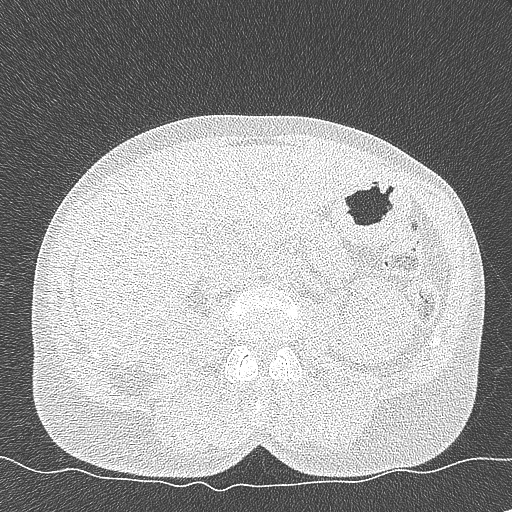
[im 46/336  lung]
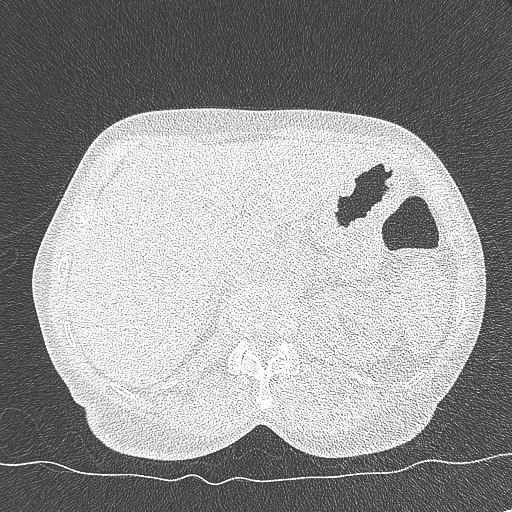
[im 77/336  lung]
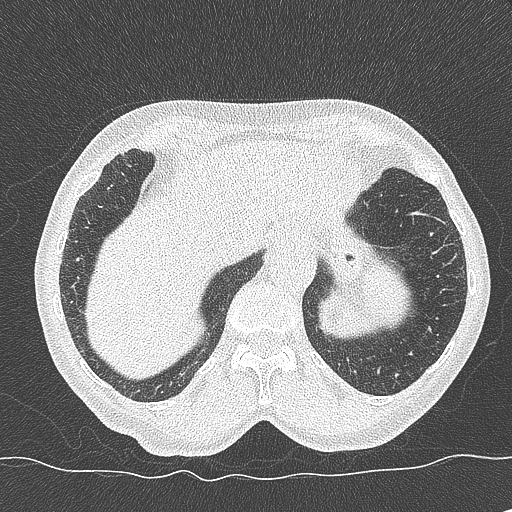
[im 107/336  lung]
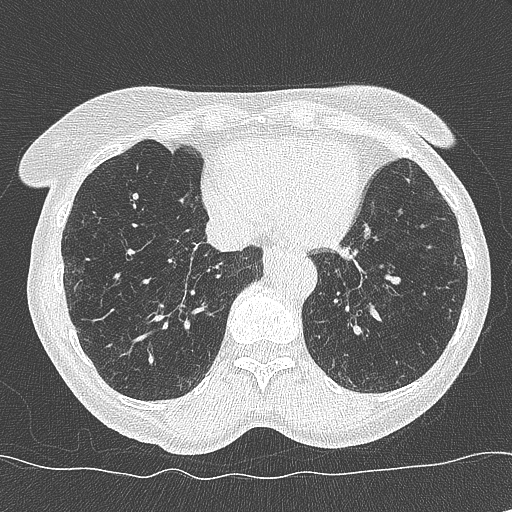
[im 122/336  mediastinal]
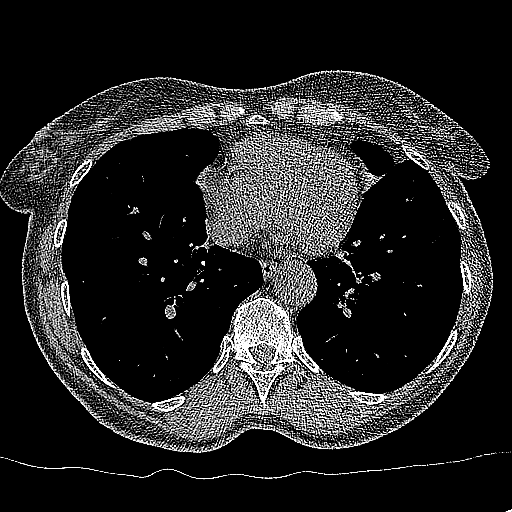
[im 122/336  lung]
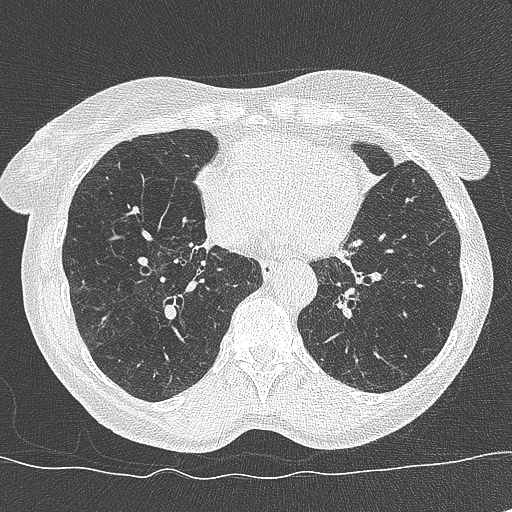
[im 153/336  lung]
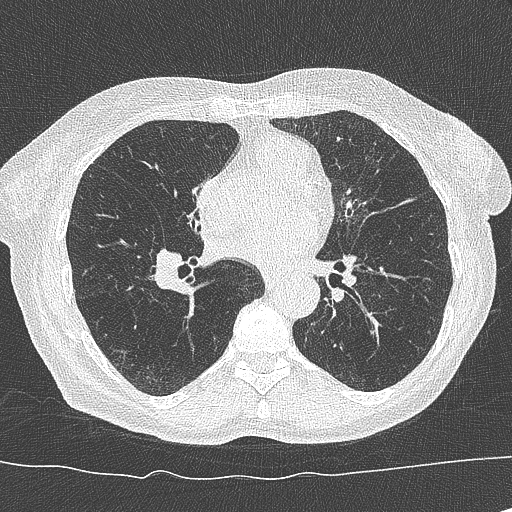
[im 183/336  lung]
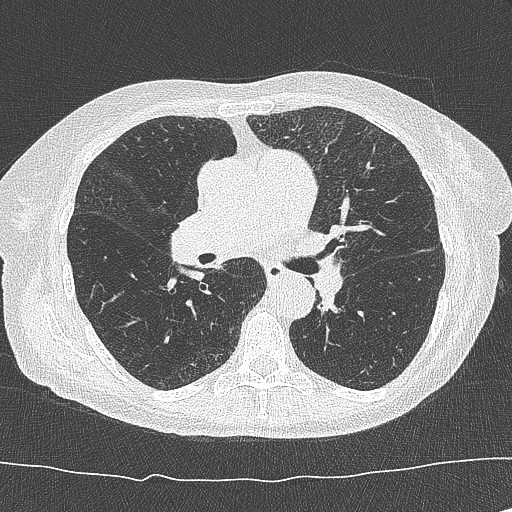
[im 214/336  lung]
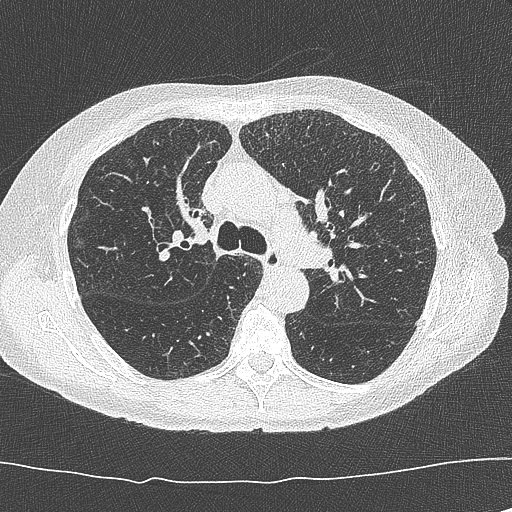
[im 229/336  mediastinal]
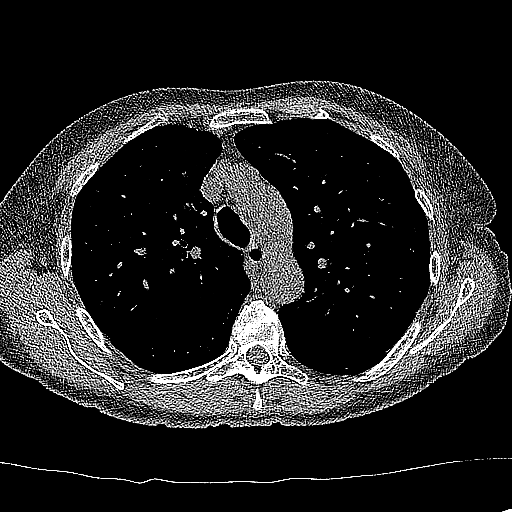
[im 229/336  lung]
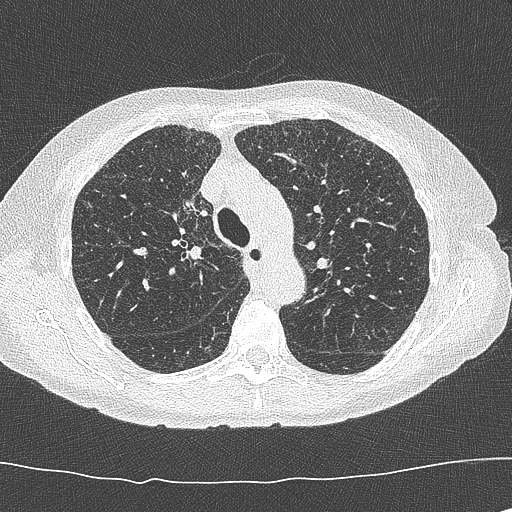
[im 259/336  lung]
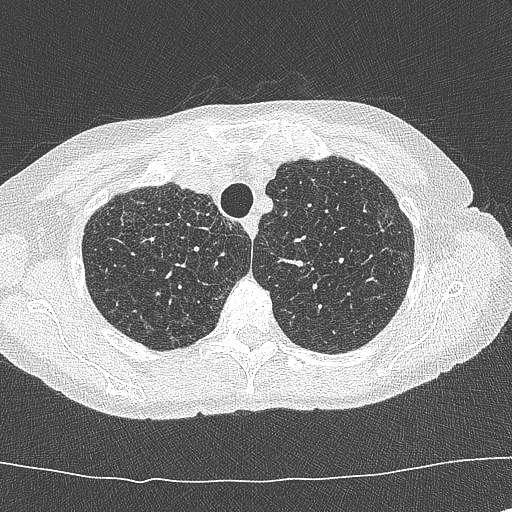
[im 290/336  lung]
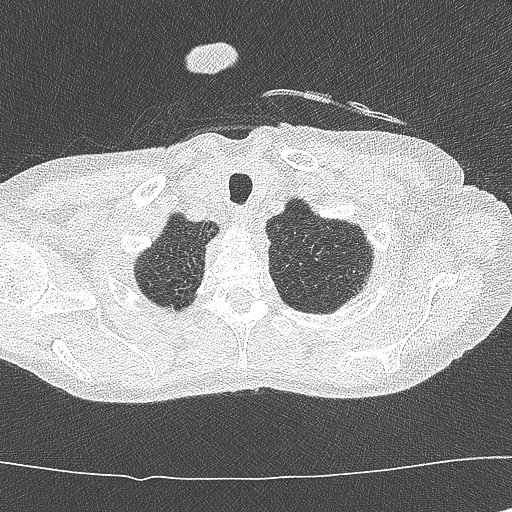
[im 320/336  lung]
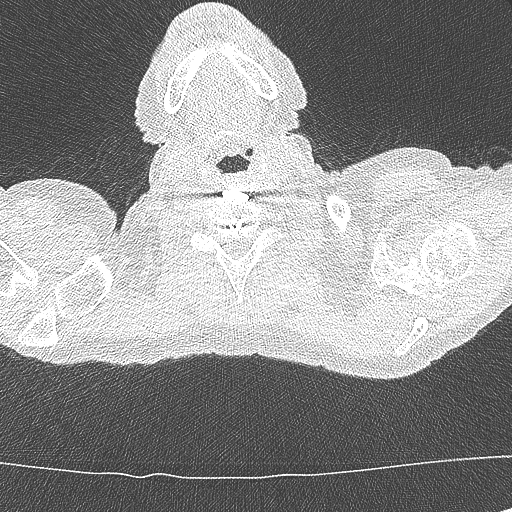

[Series 5: coronal · coronal · 0.62mm/px · 3 of 245 slices shown]
[im 49/245  lung]
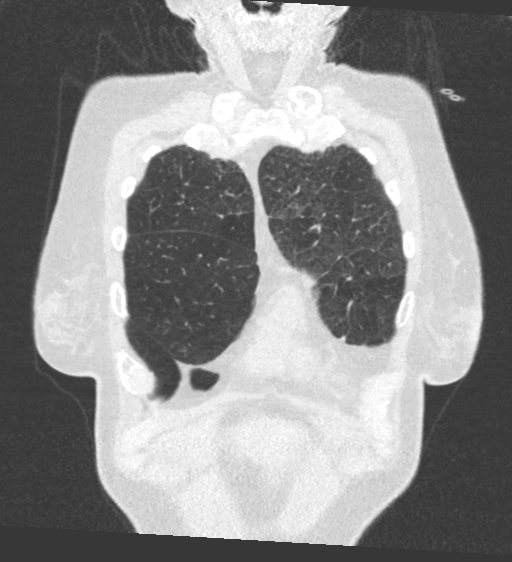
[im 98/245  lung]
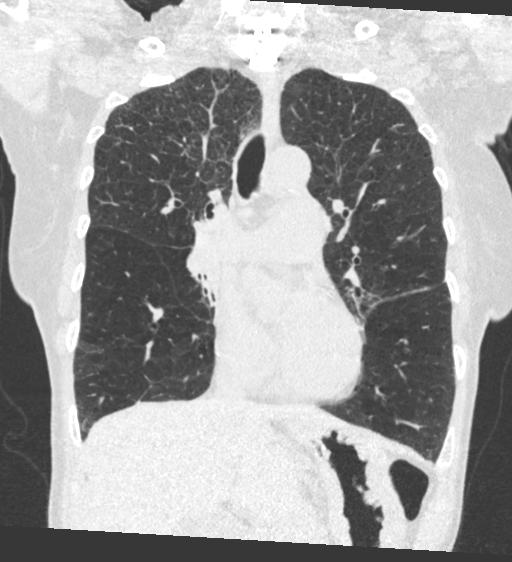
[im 147/245  lung]
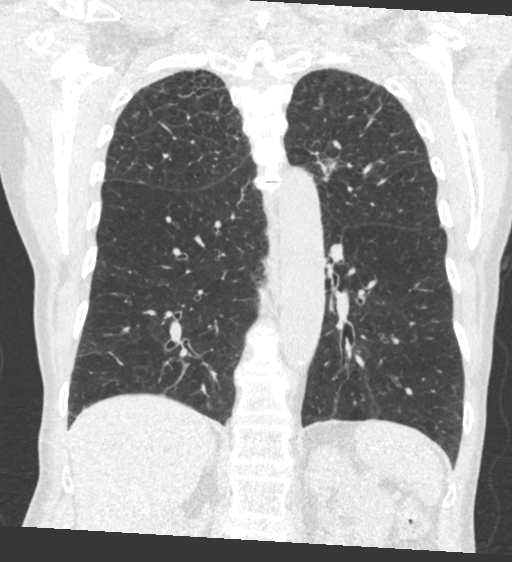

[15 of 36 positions shown; findings below may reference images not displayed]

FINDINGS: Cardiovascular: The heart size is normal. No substantial pericardial
effusion. Coronary artery calcification is evident. Mild
atherosclerotic calcification is noted in the wall of the thoracic
aorta. Enlargement of the pulmonary outflow tract/main pulmonary
arteries suggests pulmonary arterial hypertension.

Mediastinum/Nodes: No mediastinal lymphadenopathy. No evidence for
gross hilar lymphadenopathy although assessment is limited by the
lack of intravenous contrast on the current study. The esophagus has
normal imaging features. There is no axillary lymphadenopathy.

Lungs/Pleura: Centrilobular emphsyema noted. Scattered tiny
bilateral pulmonary nodules are identified measuring up to maximum
3.8 mm size. No suspicious pulmonary nodule or mass. No focal
airspace consolidation. No pleural effusion.

Upper Abdomen: 2.8 cm water density lesion interpolar left kidney is
compatible with a cyst.

Musculoskeletal: No worrisome lytic or sclerotic osseous
abnormality.
IMPRESSION: 1. Lung-RADS 2, benign appearance or behavior. Continue annual
screening with low-dose chest CT without contrast in 12 months.
2. Enlargement of the pulmonary outflow tract/main pulmonary
arteries suggests pulmonary arterial hypertension.
3. Aortic Atherosclerosis (JBDSV-03U.U) and Emphysema (JBDSV-7O6.2).

## 2023-12-03 ENCOUNTER — Telehealth: Payer: Self-pay

## 2023-12-03 NOTE — Telephone Encounter (Signed)
 Pt said she is having chest, back, shoulder, etc pain and the pain is stable or how it usually is. Pt states she is wanting to schedule her appointment with Dr Waymond Hailey in [place]. Pt does not want to go to any other location. Routing to front office for scheduling.

## 2023-12-05 NOTE — Telephone Encounter (Signed)
 Per message: Pt said she is having chest, back, shoulder, etc pain and the pain is stable or how it usually is.   This indicates pain is chronic, and does not seem to be solely pulmonary related. Patient needs to follow up with PCP for this.   Spoke with patient and scheduled her for OV 12/17/23. She is aware to contact PCP for chronic pain concerns. Nothing further needed at this time.

## 2023-12-05 NOTE — Telephone Encounter (Signed)
 Current opening for [facility] in [place] is 01/27/2024. Please advise if a double book is needed or next steps.

## 2023-12-14 NOTE — Progress Notes (Unsigned)
 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   She was diagnosed with COPD about 2 years ago but has had progressively worsening shortness of breath over the last 2 years.  She also has cough, sputum production, wheezing, but dyspnea on exertion is her main complaint.  Her activity is fairly limited; she is only able to walk a few steps around her house without stopping.   She is on 3 L supplemental oxygen  at home.  She was prescribed 2.5 L but had ongoing dyspnea on titrated up to 3 L.  Her home saturations are usually in the 90s, but sometimes drop into the 80s.  She is currently using her Trelegy inhaler daily with frequent albuterol .  She continues to smoke 0.25- 0.5 packs/day; she has smoked 0.5 ppd for the last 50 years.     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  Rec: trelegy Check echo: 12/15/19  G I diastolic dysfunction / no cor pulmonale    Stopped trelegy  mid Jan 2022 due irritated mouth   Admit date: 08/15/2020 Discharge date: 08/18/2020 Brief Hospitalization Summary: Please see all hospital notes, images, labs for full details of the hospitalization. ADMISSION HPI: Candace Harris is a 73 y.o. female with medical history significant for HTN, CAD, anxiety, COPD (on 3-6 L of O2 at home), T2DM, GERD and bipolar disorder who presents to the emergency department due to 85-month onset of shortness of breath that has been progressively worsening, she complained of decreased oral intake due to loss of taste, she also endorsed loss of smell and she states that she had diarrhea for 2 to 3 weeks last month which has resolved.  She states that she saw her PCP in [place] who prescribed prednisone   for her, but she has not yet filled the prescription.  She decided to go to the ED for further evaluation due to persistent worsening shortness of breath and concern for dehydration.   ED Course: In the emergency department, she was tachycardic and intermittently tachypneic. Work-up in the ED showed leukocytosis, thrombocytosis, hypokalemia, hyponatremia, BUN/creatinine 41/1.70 (baseline creatinine was 0.7). D-dimer 0.78. Chest x-ray was reflective of emphysema but showed no active cardiopulmonary disease Breathing treatment was provided, Solu-Medrol  125 Mg x1 was given, IV Ativan  Librium x1 due to anxiety was given and patient was provided with 500 mL of IV NS. Hospitalist was asked to admit patient for further evaluation and management.   Hospital Course    Acute on chronic respiratory failure with hypoxia - secondary to COPD exacerbation.  Pt much improved after IV steroids, bronchodilators and supportive measures. .  Prednisone  taper.   Hypomagnesemia - IV replacement given and repleted.  Hypokalemia - repleted.  Essential hypertension - continue home meds GERD - protonix  for GI protection.  Type 2 DM - monitored and stable.   AKI - resolved after hydration.    Discharge Diagnoses:  Principal Problem:   Shortness of breath Active Problems:   AKI (acute kidney injury) (HCC)   Dehydration   Leukocytosis   Thrombocytosis   Essential hypertension   Elevated d-dimer   Hypokalemia   Anxiety   COPD  with acute exacerbation (HCC)   Hyperlipidemia   Diabetes mellitus (HCC)   GERD (gastroesophageal reflux disease)   CAD (coronary artery disease)        History of Present Illness  11/02/2020  Pulmonary/ 1st office eval/ Candace Harris / [facility] GOLD I copd with component of UACS/ anxiety  Chief Complaint  Patient presents with   Follow-up    Productive cough with white phlegm since January 2022  Dyspnea:  Can do 17 steps at appt complex but has to stop at top to recover / last  shopping x 2 y prior to OV   Cough: hocking min white daytime off gerd rx  Sleep: on side flat bed s resp symptoms "as long as I have on my oxygen "  SABA use: avg 6-8 x per day / no maint rx  02 2lpm 24/7 rec Plan A = Automatic = Always=    Breztri  Take 2 puffs first thing in am and then another 2 puffs about 12 hours later.  Work on inhaler technique: Plan B = Backup (to supplement plan A, not to replace it) Only use your albuterol  inhaler as a rescue medication Try prilosec 40mg   Take 30-60 min before first meal of the day and Pepcid  ac (famotidine ) 20 mg one after supper  Until return  GERD diet  Please schedule a follow up office visit in 6 weeks, call sooner if needed with all medications /inhalers/ solutions in hand so we can verify exactly what you are taking. This includes all medications from all doctors and over the counters         01/13/2023  f/u ov/[place] office/Candace Harris re: GOLD 1 copd  and anemia maint on symbicort  160/spiriva   did not  bring meds except hfa / still smoking  Chief Complaint  Patient presents with   Follow-up   Dyspnea:  same as last ov/ anemia has not been corrected but w/u in progress  Cough: varies min mucoid  Sleeping: flat bed 3 pillows  SABA use: p exercise only 02: 2.5 to 3lpm not titrating  Rec Plan A = Automatic = Always=    symbicort  160 and spiriva  2 puffs of each 1st thing in am and then another 2 puffs symbicort   12 hours later Work on inhaler technique:  Plan B = Backup (to supplement plan A, not to replace it) Only use your albuterol  inhaler as a rescue medication Plan C = Crisis (instead of Plan B but only if Plan B stops working) - only use your albuterol  nebulizer if you first try Plan B  Also  Ok to try albuterol  15 min before an activity (on alternating days between your inhaler/ nebulizer)  that you know would usually make you short of breath     Make sure you check your oxygen  saturation  AT  your highest level of activity (not after  you stop)   to be sure it stays over 90%   Please schedule a follow up office visit in 3 months call sooner if needed with all medications /inhalers/ solutions in hand    05/08/2023  f/u ov/[place] office/Candace Harris re: GOLD 1 copd/ 02 dep/ anemia  maint on symbicort  160  did not bring meds / still smoking / angioedema > ER better on steroids/ h1 and h2  Chief Complaint  Patient presents with   COPD    GOLD I   Dyspnea:  shops at [employer] / crosses parking lot ok  Cough: not much  Sleeping: flat bed on  side 3 pillows  s resp cc  SABA use: nt much  02: 2lpm hs / 2lpm daytime but 3 lpm walking > 95% Lung cancer screening: not since 12/21/21 Rec No change in pulmonary medications  My office will be contacting you by phone for referral to Dr Idolina Maker  > not done as of 12/17/2023   Please schedule a follow up visit in 6  months but call sooner if needed    Patient admitted on: 08/13/2023     Chief Complaint  Patient presents with  Shortness of Breath  Day of admission HPI: August 13, 2023   Discharge  08/20/23   Patient admitted on Home O2? - yes Patient on home anticoagulant? - no Patient admitted with Chronic home foley catheter? - No Foley catheter placed or replaced by another service prior to admission? - No  Mental Status on Admission: The patient is Alert and oriented to PERSON The patient is Alert And oriented to TIME The patient is Alert and oriented to LOCATION  This is a 73 y.o. 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, DM, GERD, FM, depression, anxiety, PTSD presents to the emergency department for evaluation of one week of cough with progressively worsening SOB. She required CPAP then BiPAP and was started on IV antibiotics, nebulizer treatments, IV fluid, electrolyte replacement. Severe dyspnea at rest.  Acute hypoxic respiratory failure 2/2 exacerbation of COPD, tobacco abuse, sepsis with organ failure, rhinovirus, dyspnea Originally admitted to ICU;  changed to overflow 08/17/23 No longer needing BIPAP due to work of breathing; on 1L saturating upper 90s IV and inhaled Steroids, O2, nebs  Escalate antibiotics from Azithro/Ceftriaxone (received 3 days) to Cefepime/Doxy day 5; WBC 25.3 today; (getting IV steroids), procalcitonin negative (0.07) Positive for rhinovirus CTA 1/29 ruled out PE; 2 view CXR repeated 2/1, showed no active disease Counseled regarding tobacco cessation, nicotine patch available if needed *Sepsis on admission as evidenced by elevated white count 18.2, tachycardia up to 113 bpm, respiratory failure requiring BiPAP, source lung Lasix 2/3 and 2/4 with good results  AT d/c:  Pt is sitting up in bed resting. Cough has improved. She is wearing 1L of oxygen . Feeling much better, feels she is ready for discharge.       12/17/2023 post hosp f/u ov/[place] office/Candace Harris re: GOLD 1 copd/ 02 dep/ anemia/ 02 dep  maint on symbicort160/ incruse   / still smoking some Chief Complaint  Patient presents with   Shortness of Breath  Dyspnea:  room to room  Cough: rattling / beige mucus > abx already called in  Sleeping: flat bed 3 pillows s   resp cc  SABA use: use p ex 02: 3lpm LCS: per [facility] per pt   No obvious day to day or daytime variability or assoc excess/ purulent sputum or mucus plugs or hemoptysis or cp or chest tightness, subjective wheeze or overt sinus or hb symptoms.    Also denies any obvious fluctuation of symptoms with weather or environmental changes or other aggravating or alleviating factors except as outlined above   No unusual exposure hx or h/o childhood pna/ asthma or knowledge of premature birth.  Current Allergies, Complete Past Medical History, Past Surgical History, Family History, and Social History were reviewed in [employer] record.  ROS  The following are not active complaints unless bolded Hoarseness, sore throat, dysphagia, dental problems, itching, sneezing,  nasal  congestion or discharge of excess mucus or purulent secretions, ear ache,  fever, chills, sweats, unintended wt loss or wt gain, classically pleuritic or exertional cp,  orthopnea pnd or arm/hand swelling  or leg swelling, presyncope, palpitations, abdominal pain, anorexia, nausea, vomiting, diarrhea  or change in bowel habits or change in bladder habits, change in stools or change in urine, dysuria, hematuria,  rash, arthralgias, visual complaints, headache, numbness, weakness or ataxia or problems with walking or coordination,  change in mood or  memory.        Current Meds  Medication Sig   acetaminophen  (TYLENOL ) 650 MG CR tablet Take 650 mg by mouth every 8 (eight) hours as needed for pain.   albuterol  (VENTOLIN  HFA) 108 (90 Base) MCG/ACT inhaler Inhale 2 puffs into the lungs every 4 (four) hours as needed for wheezing or shortness of breath. for wheezing   amLODipine  (NORVASC ) 5 MG tablet Take 5 mg by mouth daily.   baclofen (LIORESAL) 10 MG tablet Take 10 mg by mouth 2 (two) times daily.   budesonide -formoterol  (BREYNA ) 160-4.5 MCG/ACT inhaler Inhale 2 puffs into the lungs in the morning and at bedtime.   budesonide -formoterol  (SYMBICORT ) 160-4.5 MCG/ACT inhaler INHALE 2 PUFFS INTO THE LUNGS IN THE MORNING AND AT BEDTIME   busPIRone (BUSPAR) 5 MG tablet Take 5 mg by mouth. TAKE ONE TABLET BY MOUTH TWICE DAILY @ 9AM & 5PM FOR 90 DAYS   CAPLYTA 42 MG capsule Take 42 mg by mouth daily.   cyclobenzaprine (FLEXERIL) 10 MG tablet Take 10 mg by mouth 2 (two) times daily.   dicyclomine (BENTYL) 20 MG tablet Take 20 mg by mouth 4 (four) times daily.   famotidine  (PEPCID ) 20 MG tablet Take 1 tablet (20 mg total) by mouth 2 (two) times daily.   fluticasone  (FLONASE) 50 MCG/ACT nasal spray Place 2 sprays into both nostrils daily.   HYDROcodone -acetaminophen  (NORCO) 10-325 MG tablet Take 1 tablet by mouth 3 (three) times daily as needed for pain.   hydrOXYzine (ATARAX) 10 MG tablet Take 10 mg by mouth 2  (two) times daily as needed.   ipratropium (ATROVENT) 0.02 % nebulizer solution Inhale 0.5 mg into the lungs.   latanoprost (XALATAN) 0.005 % ophthalmic solution 1 drop at bedtime.   levalbuterol (XOPENEX) 1.25 MG/3ML nebulizer solution Inhale 1.25 mg into the lungs.   losartan (COZAAR) 50 MG tablet Take 50 mg by mouth every morning.   MACROBID 100 MG capsule Take 100 mg by mouth 2 (two) times daily.   megestrol (MEGACE) 20 MG tablet Take 20 mg by mouth daily.   metFORMIN (GLUCOPHAGE) 500 MG tablet Take 500 mg by mouth 2 (two) times daily with a meal.   naloxone (NARCAN) nasal spray 4 mg/0.1 mL Place 0.4 mg into the nose once.   nitroGLYCERIN (NITROSTAT) 0.4 MG SL tablet Place 0.4 mg under the tongue every 5 (five) minutes as needed for chest pain.   omeprazole  (PRILOSEC) 40 MG capsule Take 1 capsule (40 mg total) by mouth 2 (two) times daily before a meal. TAKE 1 CAPSULE 30 TO 60 MINUTES BEFORE FIRST MEAL OF THE DAY   ondansetron (ZOFRAN) 4 MG tablet Take 4 mg by mouth daily.   predniSONE  (DELTASONE ) 10 MG tablet Take 2 tablets (20 mg total) by mouth daily.   pregabalin (LYRICA) 25 MG capsule Take 25 mg by mouth 2 (two) times daily.   rosuvastatin  (CRESTOR ) 20 MG tablet Take 20 mg by mouth at bedtime.   sertraline (ZOLOFT) 100 MG tablet Take 100 mg by mouth every morning.  Past Medical History:  Diagnosis Date   Anxiety    Bipolar affective disorder (HCC)    CAD (coronary artery disease)    Chronic respiratory failure (HCC)    COPD (chronic obstructive pulmonary disease) (HCC)    DM (diabetes mellitus) (HCC)    GERD (gastroesophageal reflux disease)    HTN (hypertension)    Polymyalgia rheumatica (HCC)           Objective:    Wt  12/17/2023           96  05/08/2023       96  01/13/2023           112  12/02/2022        110  05/30/2022      107  04/23/2022      104  03/25/2022        103  09/24/2021        107 03/15/2021         107  12/14/20 107 lb 12.8 oz (48.9  kg)  11/02/20 112 lb 12.8 oz (51.2 kg)  08/15/20 132 lb 4.4 oz (60 kg)    Vital signs reviewed  12/17/2023  - Note at rest 02 sats  90% on 3lpm cont    General appearance:    amb thin bf /walks with 2 wheeled walker  HEENT : Oropharynx  clear/ edentulous   Nasal turbinates nl    NECK :  without  apparent JVD/ palpable Nodes/TM    LUNGS: no acc muscle use,  Mild barrel  contour chest wall with bilateral  Distant bs s audible wheeze and  without cough on insp or exp maneuvers  and mild  Hyperresonant  to  percussion bilaterally     CV:  RRR  no s3 or murmur or increase in P2, and no edema   ABD:  soft and nontender with pos end  insp Hoover's  in the supine position.  No bruits or organomegaly appreciated   MS:  Nl gait/ ext warm without deformities Or obvious joint restrictions  calf tenderness, cyanosis or clubbing     SKIN: warm and dry without lesions    NEURO:  alert, approp, nl sensorium with  no motor or cerebellar deficits apparent.             I personally reviewed images and agree with radiology impression as follows:  CXR:   pa and lateral  08/16/23  No acute dz   Assessment

## 2023-12-17 ENCOUNTER — Encounter: Payer: Self-pay | Admitting: Internal Medicine

## 2023-12-17 ENCOUNTER — Ambulatory Visit (INDEPENDENT_AMBULATORY_CARE_PROVIDER_SITE_OTHER): Payer: Medicare (Managed Care) | Admitting: Internal Medicine

## 2023-12-17 VITALS — BP 144/77 | HR 102 | Ht 61.0 in | Wt 96.2 lb

## 2023-12-17 DIAGNOSIS — J449 Chronic obstructive pulmonary disease, unspecified: Secondary | ICD-10-CM

## 2023-12-17 DIAGNOSIS — Z9981 Dependence on supplemental oxygen: Secondary | ICD-10-CM | POA: Diagnosis not present

## 2023-12-17 DIAGNOSIS — F17210 Nicotine dependence, cigarettes, uncomplicated: Secondary | ICD-10-CM

## 2023-12-17 DIAGNOSIS — J9611 Chronic respiratory failure with hypoxia: Secondary | ICD-10-CM

## 2023-12-17 NOTE — Assessment & Plan Note (Signed)

## 2023-12-17 NOTE — Patient Instructions (Signed)
 We will order humidity for your oxygen      Ok to try albuterol  15 min before an activity (on alternating days)  that you know would usually make you short of breath and see if it makes any difference and if makes none then don't take albuterol  after activity unless you can't catch your breath as this means it's the resting that helps, not the albuterol .  Work on inhaler technique:  relax and gently blow all the way out then take a nice smooth full deep breath back in, triggering the inhaler at same time you start breathing in.  Hold breath in for at least  5 seconds if you can. Blow out breyna   thru nose. Rinse and gargle with water  when done.  If mouth or throat bother you at all,  try brushing teeth/gums/tongue with arm and hammer toothpaste/ make a slurry and gargle and spit out.   The key is to stop smoking completely before smoking completely stops you!    Please schedule a follow up visit in 3 months but call sooner if needed

## 2023-12-17 NOTE — Addendum Note (Signed)
 Addended by: Michael Ades T on: 12/17/2023 04:49 PM   Modules accepted: Orders

## 2023-12-17 NOTE — Assessment & Plan Note (Signed)
 Active smoker -  PFT's  11/02/2020  FEV1 1.42 (85 % ) ratio 0.67  p 0 % improvement from saba p ? prior to study with DLCO  5.50 (31%) corrects to 1.55 (36%)  for alv volume and FV curve mild concavity   - 11/02/2020  try breztri  2bid and max gerd rx   - Allergy profile 03/15/21  >  Eos 0.1 /  IgE  72 - LDSCT  12/21/21 Centrilobular emphsyema  - 12/02/2022    >  added spiriva  2.5 x 2 puffs each am - 12/17/2023  After extensive coaching inhaler device,  effectiveness =    75% with hfa> continue breyna  160/ incruse  / prn saba    Group D (now reclassified as E) in terms of symptom/risk and laba/lama/ICS  therefore appropriate rx at this point >>>  breyna  160/ incruse and more approp saba  Re SABA :  I spent extra time with pt today reviewing appropriate use of albuterol  for prn use on exertion with the following points: 1) saba is for relief of sob that does not improve by walking a slower pace or resting but rather if the pt does not improve after trying this first. 2) If the pt is convinced, as many are, that saba helps recover from activity faster then it's easy to tell if this is the case by re-challenging : ie stop, take the inhaler, then p 5 minutes try the exact same activity (intensity of workload) that just caused the symptoms and see if they are substantially diminished or not after saba 3) if there is an activity that reproducibly causes the symptoms, try the saba 15 min before the activity on alternate days   If in fact the saba really does help, then fine to continue to use it prn but advised may need to look closer at the maintenance regimen being used to achieve better control of airways disease with exertion.

## 2023-12-17 NOTE — Assessment & Plan Note (Addendum)
 02 x around 2019 by Dr Zoila Hines -  11/02/2020   Walked RA  approx   450 ft  @ mod pace  stopped due to  [employer] /fatigue with sats still 94% - 03/15/2021   Walked on RA x  3  lap(s) =  approx 450  @ slow to mod pace, stopped due to end of study  with lowest 02 sats 94%   -  04/23/2022 Patient Saturations on Room Air at Rest = 88% Patient Saturations on 2.5LO2 of oxygen  at rest= 93% Patient Saturations on 2.5 Liters of oxygen  while Ambulating =91% lap one = 150 ft  2.5 Liters of oxygen  while Ambulating= 90% lap two = 300 ft, slow rollator pace   - 05/30/2022   Walked on 2.5 lpm   x  2  lap(s) =  approx 399  ft  @ slow pace, stopped due to tired with lowest 02 sats 94%    Adequate control on present rx, reviewed in detail with pt > no change in rx needed    Reminded: Make sure you check your oxygen  saturation  AT  your highest level of activity (not after you stop)   to be sure it stays over 90% and adjust  02 flow upward to maintain this level if needed but remember to turn it back to previous settings when you stop (to conserve your supply).   F/u q 45m sooner prn   Each maintenance medication was reviewed in detail including emphasizing most importantly the difference between maintenance and prns and under what circumstances the prns are to be triggered using an action plan format where appropriate.  Total time for H and P, chart review, counseling, reviewing hfa/dpi/neb/ 02  device(s) and generating customized AVS unique to this office visit / same day charting = 22 min

## 2023-12-18 NOTE — Addendum Note (Signed)
 Addended by: Michael Ades T on: 12/18/2023 01:31 PM   Modules accepted: Orders

## 2023-12-24 ENCOUNTER — Ambulatory Visit: Payer: Medicare (Managed Care) | Admitting: Internal Medicine

## 2024-01-14 NOTE — Progress Notes (Deleted)
 GI Office Note    Referring Provider: Financial risk analyst, [facility] Primary Care Physician:  Collective, [facility] Primary Gastroenterologist: Lamar HERO.Rourk, MD  Date:  01/14/2024  ID:  Candace Harris, DOB 1950-09-12, MRN [id number]  Chief Complaint   No chief complaint on file.  History of Present Illness  Candace Harris is a 73 y.o. female with a history of COPD on home oxygen , diabetes, GERD, hypertension, stroke, anxiety/depression, CAD, polymyalgia rheumatica presenting today with complaint of ***   Possible colonoscopy in 2015 at [facility].  No procedure report available.    EGD 03/13/22: -Slightly ringed esophagus s/p dilation and biopsy -Normal stomach -Normal duodenum -Esophageal biopsies unremarkable, negative for EOE   Labs 11/07/22: Hgb 8.5, MCV 72. CMP unremarkable.  Labs 12/02/22: Hgb 7.7, MCV 70. TSH normal. BMP unremarkable   OV 12/10/2022.  She reported worsening shortness of breath over the prior 3 weeks her oxygen  level.  Admits to smoking about 5 cigarettes daily.  Denies any constipation or diarrhea.  Appetite has been poor for the last 2 years.  Needs to decrease in energy.  Reportedly her weight is fluctuated from 105-115 pounds.  Had recently started iron but vomited back up upper abdominal pain but had reported chronic tenderness for about 50 years.  Once daily that usually helps prevent significant vomiting.  Admitted to taking Mucinex  and Coricidin to help breathing and congestion.  Taking dicyclomine as needed for stomach pain as well as her PPI once daily. Scheduled for EGD and colonoscopy with Dr. Shaaron.  PPI increased to twice daily.  Requested prior colonoscopy records from [place].  Labs ordered including H/H and anemia panel.   Labs 01/13/23: Hemoglobin 10.2, iron saturation 5%, iron 20, ferritin 39.  Advised patient she needed to increase her iron tablet to daily.   OV 03/06/2023.  Patient reported early satiety.  She reportedly ate a decent breakfast and then would have  a sandwich for lunch.  For dinner she would eat something with greens and potatoes and sometimes cornbread.  For meats she will usually eat chicken or ham.  Does take Megace twice daily which she feels like does increase her appetite.  Uses Zofran once daily for nausea.  Usually uses dicyclomine twice daily for abdominal pain.  Also noted to be on hydrocodone  4 times a day for chronic pain.  Taking a stool softener daily and having daily bowel movements, stools occasionally hard.  Reported occasional pill dysphagia.  Advised to schedule EGD and colonoscopy for further evaluation of anemia.  Advised high-protein diet with Ensure supplementation.  If EGD unrevealing plan for CT of the abdomen pelvis to evaluate for weight loss.  Advised her to continue stool softener, Megace, PPI twice daily, and Zofran.   EGD 05/09/2023: -Small hiatal hernia -Gastritis s/p biopsy -Normal duodenum -Pathology negative for H. pylori.  Gastric mucosa with hyperemia   Colonoscopy 05/09/2023: -Nonbleeding internal hemorrhoids -Sigmoid diverticulosis -Three 4 to 6 mm polyps in the descending and sigmoid colon -Stool in the entire examined colon -Polyps revealed to be tubular adenomas and hyperplastic -Repeat colonoscopy in 5 years   Low-dose chest CT for lung cancer screening 06/19/2023: - Lung RADS 2, benign appearance or behavior.  Recommend annual screening with low-dose chest CT. - Enlarged pulmonary trunk, indicative of pulmonary arterial hypertension - Emphysema   Last office visit 06/23/23. Still with GERD symptoms. Zofran daily in AM is helping with nausea. Omeprazole  once daily. BM every other day usually. Reported angioedema intermittently and was treated with prednisone . CTA A/P  ordered for evaluation of weight loss to rule out mesenteric ischemia, increase PPI to BID. Continue megace, high protein diet recommended. Start miralax a few times per week. Given nystatin  for thrush. Referral for allergy testing  given.   CTA A/P ordered/planned for early January and not performed.   Today:    Wt Readings from Last 5 Encounters:  12/17/23 96 lb 3.2 oz (43.6 kg)  06/23/23 99 lb 4.8 oz (45 kg)  05/22/23 95 lb (43.1 kg)  05/09/23 95 lb (43.1 kg)  05/08/23 96 lb (43.5 kg)    Current Outpatient Medications  Medication Sig Dispense Refill   acetaminophen  (TYLENOL ) 650 MG CR tablet Take 650 mg by mouth every 8 (eight) hours as needed for pain.     albuterol  (VENTOLIN  HFA) 108 (90 Base) MCG/ACT inhaler Inhale 2 puffs into the lungs every 4 (four) hours as needed for wheezing or shortness of breath. for wheezing 18 g 1   amLODipine  (NORVASC ) 5 MG tablet Take 5 mg by mouth daily.     baclofen (LIORESAL) 10 MG tablet Take 10 mg by mouth 2 (two) times daily.     budesonide -formoterol  (BREYNA ) 160-4.5 MCG/ACT inhaler Inhale 2 puffs into the lungs in the morning and at bedtime. 1 each 11   budesonide -formoterol  (SYMBICORT ) 160-4.5 MCG/ACT inhaler INHALE 2 PUFFS INTO THE LUNGS IN THE MORNING AND AT BEDTIME 10.2 g 6   busPIRone (BUSPAR) 5 MG tablet Take 5 mg by mouth. TAKE ONE TABLET BY MOUTH TWICE DAILY @ 9AM & 5PM FOR 90 DAYS     CAPLYTA 42 MG capsule Take 42 mg by mouth daily.     cetirizine (ZYRTEC) 10 MG tablet Take 1 tablet by mouth daily.     cyclobenzaprine (FLEXERIL) 10 MG tablet Take 10 mg by mouth 2 (two) times daily.     dicyclomine (BENTYL) 20 MG tablet Take 20 mg by mouth 4 (four) times daily.     famotidine  (PEPCID ) 20 MG tablet Take 1 tablet (20 mg total) by mouth 2 (two) times daily. 14 tablet 0   fluticasone  (FLONASE) 50 MCG/ACT nasal spray Place 2 sprays into both nostrils daily.     HYDROcodone -acetaminophen  (NORCO) 10-325 MG tablet Take 1 tablet by mouth 3 (three) times daily as needed for pain.     hydrOXYzine (ATARAX) 10 MG tablet Take 10 mg by mouth 2 (two) times daily as needed.     ipratropium (ATROVENT) 0.02 % nebulizer solution Inhale 0.5 mg into the lungs.     latanoprost  (XALATAN) 0.005 % ophthalmic solution 1 drop at bedtime.     levalbuterol (XOPENEX) 1.25 MG/3ML nebulizer solution Inhale 1.25 mg into the lungs.     losartan (COZAAR) 50 MG tablet Take 50 mg by mouth every morning.     MACROBID 100 MG capsule Take 100 mg by mouth 2 (two) times daily.     megestrol (MEGACE) 20 MG tablet Take 20 mg by mouth daily.     metFORMIN (GLUCOPHAGE) 500 MG tablet Take 500 mg by mouth 2 (two) times daily with a meal.     naloxone (NARCAN) nasal spray 4 mg/0.1 mL Place 0.4 mg into the nose once.     nitroGLYCERIN (NITROSTAT) 0.4 MG SL tablet Place 0.4 mg under the tongue every 5 (five) minutes as needed for chest pain.     omeprazole  (PRILOSEC) 40 MG capsule Take 1 capsule (40 mg total) by mouth 2 (two) times daily before a meal. TAKE 1 CAPSULE 30 TO 60  MINUTES BEFORE FIRST MEAL OF THE DAY 90 capsule 3   ondansetron (ZOFRAN) 4 MG tablet Take 4 mg by mouth daily.     predniSONE  (DELTASONE ) 10 MG tablet Take 2 tablets (20 mg total) by mouth daily. 14 tablet 0   pregabalin (LYRICA) 25 MG capsule Take 25 mg by mouth 2 (two) times daily.     rosuvastatin  (CRESTOR ) 20 MG tablet Take 20 mg by mouth at bedtime.     sertraline (ZOLOFT) 100 MG tablet Take 100 mg by mouth every morning.     No current facility-administered medications for this visit.    Past Medical History:  Diagnosis Date   Anxiety    Arthritis    Bipolar affective disorder (HCC)    CAD (coronary artery disease)    Chronic respiratory failure (HCC)    Complication of anesthesia    COPD (chronic obstructive pulmonary disease) (HCC)    Depression    DM (diabetes mellitus) (HCC)    Fibromyalgia    GERD (gastroesophageal reflux disease)    History of angioedema    HTN (hypertension)    Polymyalgia rheumatica (HCC)    PONV (postoperative nausea and vomiting)    Stroke [facility])     Past Surgical History:  Procedure Laterality Date   ABDOMINAL HYSTERECTOMY N/A    ANTERIOR FUSION CERVICAL SPINE  2012    C5-7   BACK SURGERY     BALLOON DILATION N/A 05/09/2023   Procedure: BALLOON DILATION;  Surgeon: Cindie Carlin POUR, DO;  [facility];  Service: Endoscopy;  Laterality: N/A;   BIOPSY  03/13/2022   Procedure: BIOPSY;  Surgeon: Shaaron Lamar HERO, MD;  [facility];  Service: Endoscopy;;   BIOPSY  05/09/2023   Procedure: BIOPSY;  Surgeon: Cindie Carlin POUR, DO;  [facility];  Service: Endoscopy;;   COLONOSCOPY WITH PROPOFOL  N/A 05/09/2023   Procedure: COLONOSCOPY WITH PROPOFOL ;  Surgeon: Cindie Carlin POUR, DO;  [facility];  Service: Endoscopy;  Laterality: N/A;  9:00 am, asa 3   ESOPHAGOGASTRODUODENOSCOPY (EGD) WITH PROPOFOL  N/A 03/13/2022   Procedure: ESOPHAGOGASTRODUODENOSCOPY (EGD) WITH PROPOFOL ;  Surgeon: Shaaron Lamar HERO, MD;  [facility];  Service: Endoscopy;  Laterality: N/A;  12:30pm   ESOPHAGOGASTRODUODENOSCOPY (EGD) WITH PROPOFOL  N/A 05/09/2023   Procedure: ESOPHAGOGASTRODUODENOSCOPY (EGD) WITH PROPOFOL ;  Surgeon: Cindie Carlin POUR, DO;  [facility];  Service: Endoscopy;  Laterality: N/A;   MALONEY DILATION N/A 03/13/2022   Procedure: AGAPITO DILATION;  Surgeon: Shaaron Lamar HERO, MD;  [facility];  Service: Endoscopy;  Laterality: N/A;   POLYPECTOMY  05/09/2023   Procedure: POLYPECTOMY;  Surgeon: Cindie Carlin POUR, DO;  [facility];  Service: Endoscopy;;   SHOULDER SURGERY      Family History  Problem Relation Age of Onset   Hypertension Mother    Breast cancer Sister    Colon cancer Neg Hx     Allergies as of 01/15/2024 - Review Complete 12/17/2023  Allergen Reaction Noted   Alendronate Other (See Comments) 02/08/2014   Ibuprofen Nausea And Vomiting 11/04/2013   Ramipril Nausea And Vomiting and Other (See Comments) 11/04/2013   Sulfabenzamide Other (See Comments) 11/04/2013    Social History   Socioeconomic History   Marital status: Single    Spouse name: Not on file   Number of  children: Not on file   Years of education: Not on file   Highest education level: Not on file  Occupational History  Not on file  Tobacco Use   Smoking status: Every Day    Current packs/day: 0.50    Average packs/day: 0.5 packs/day for 50.0 years (25.0 ttl pk-yrs)    Types: Cigarettes   Smokeless tobacco: Never  Vaping Use   Vaping status: Never Used  Substance and Sexual Activity   Alcohol use: Never   Drug use: Never   Sexual activity: Not Currently  Other Topics Concern   Not on file  Social History Narrative   Not on file   Social Drivers of Health   Financial Resource Strain: Medium Risk (08/13/2023)   [facility]   Overall Financial Resource Strain (CARDIA)    Difficulty of Paying Living Expenses: Somewhat hard  Food Insecurity: No Food Insecurity (08/13/2023)   Received [facility]   Hunger Vital Sign    Within the past 12 months, you worried that your food would run out before you got the money to buy more.: Never true    Within the past 12 months, the food you bought just didn't last and you didn't have money to get more.: Never true  Transportation Needs: Unmet Transportation Needs (08/13/2023)   Received from [facility]   PRAPARE - Transportation    Lack of Transportation (Medical): Yes    Lack of Transportation (Non-Medical): No  Physical Activity: Not on file  Stress: Not on file  Social Connections: Not on file     Review of Systems   Gen: Denies fever, chills, anorexia. Denies fatigue, weakness, weight loss.  CV: Denies chest pain, palpitations, syncope, peripheral edema, and claudication. Resp: Denies dyspnea at rest, cough, wheezing, coughing up blood, and pleurisy. GI: See HPI Derm: Denies rash, itching, dry skin Psych: Denies depression, anxiety, memory loss, confusion. No homicidal or suicidal ideation.  Heme: Denies bruising, bleeding, and enlarged lymph nodes.  Physical Exam   There were no vitals taken for  this visit.  General:   Alert and oriented. No distress noted. Pleasant and cooperative.  Head:  Normocephalic and atraumatic. Eyes:  Conjuctiva clear without scleral icterus. Mouth:  Oral mucosa pink and moist. Good dentition. No lesions. Lungs:  Clear to auscultation bilaterally. No wheezes, rales, or rhonchi. No distress.  Heart:  S1, S2 present without murmurs appreciated.  Abdomen:  +BS, soft, non-tender and non-distended. No rebound or guarding. No HSM or masses noted. Rectal: *** Msk:  Symmetrical without gross deformities. Normal posture. Extremities:  Without edema. Neurologic:  Alert and  oriented x4 Psych:  Alert and cooperative. Normal mood and affect.  Assessment  Candace Harris is a 73 y.o. female presenting today with ***  GERD, dysphagia, chronic epigastric pain, N/V:  Weight loss, lack of appetite, early satiety:  Constipation:  PLAN   *** CTA A/P? Continue omeprazole  40 mg BID  Follow up ***    Candace Melia, MSN, FNP-BC, AGACNP-BC [facility]

## 2024-01-15 ENCOUNTER — Ambulatory Visit: Payer: Medicare (Managed Care) | Admitting: Gastroenterology

## 2024-02-01 NOTE — Progress Notes (Deleted)
 GI Office Note    Referring Provider: Financial risk analyst, [facility] Primary Care Physician:  Collective, [facility] Primary Gastroenterologist: Lamar HERO.Rourk, MD  Date:  02/01/2024  ID:  Candace Harris, DOB 01-17-1951, MRN [id number]  Chief Complaint   No chief complaint on file.  History of Present Illness  Candace Harris is a 73 y.o. female with a history of COPD on home oxygen , diabetes, GERD, hypertension, stroke, anxiety/depression, CAD, polymyalgia rheumatica presenting today with complaint of ***   Possible colonoscopy in 2015 at [facility].  No procedure report available.    EGD 03/13/22: -Slightly ringed esophagus s/p dilation and biopsy -Normal stomach -Normal duodenum -Esophageal biopsies unremarkable, negative for EOE   Labs 11/07/22: Hgb 8.5, MCV 72. CMP unremarkable.  Labs 12/02/22: Hgb 7.7, MCV 70. TSH normal. BMP unremarkable   OV 12/10/2022.  She reported worsening shortness of breath over the prior 3 weeks her oxygen  level.  Admits to smoking about 5 cigarettes daily.  Denies any constipation or diarrhea.  Appetite has been poor for the last 2 years.  Needs to decrease in energy.  Reportedly her weight is fluctuated from 105-115 pounds.  Had recently started iron but vomited back up upper abdominal pain but had reported chronic tenderness for about 50 years.  Once daily that usually helps prevent significant vomiting.  Admitted to taking Mucinex  and Coricidin to help breathing and congestion.  Taking dicyclomine as needed for stomach pain as well as her PPI once daily. Scheduled for EGD and colonoscopy with Dr. Shaaron.  PPI increased to twice daily.  Requested prior colonoscopy records from [place].  Labs ordered including H/H and anemia panel.   Labs 01/13/23: Hemoglobin 10.2, iron saturation 5%, iron 20, ferritin 39.  Advised patient she needed to increase her iron tablet to daily.   OV 03/06/2023.  Patient reported early satiety.  She reportedly ate a decent breakfast and then would  have a sandwich for lunch.  For dinner she would eat something with greens and potatoes and sometimes cornbread.  For meats she will usually eat chicken or ham.  Does take Megace twice daily which she feels like does increase her appetite.  Uses Zofran once daily for nausea.  Usually uses dicyclomine twice daily for abdominal pain.  Also noted to be on hydrocodone  4 times a day for chronic pain.  Taking a stool softener daily and having daily bowel movements, stools occasionally hard.  Reported occasional pill dysphagia.  Advised to schedule EGD and colonoscopy for further evaluation of anemia.  Advised high-protein diet with Ensure supplementation.  If EGD unrevealing plan for CT of the abdomen pelvis to evaluate for weight loss.  Advised her to continue stool softener, Megace, PPI twice daily, and Zofran.   EGD 05/09/2023: -Small hiatal hernia -Gastritis s/p biopsy -Normal duodenum -Pathology negative for H. pylori.  Gastric mucosa with hyperemia   Colonoscopy 05/09/2023: -Nonbleeding internal hemorrhoids -Sigmoid diverticulosis -Three 4 to 6 mm polyps in the descending and sigmoid colon -Stool in the entire examined colon -Polyps revealed to be tubular adenomas and hyperplastic -Repeat colonoscopy in 5 years   Low-dose chest CT for lung cancer screening 06/19/2023: - Lung RADS 2, benign appearance or behavior.  Recommend annual screening with low-dose chest CT. - Enlarged pulmonary trunk, indicative of pulmonary arterial hypertension - Emphysema   Last office visit 06/23/23. Still with GERD symptoms. Zofran daily in AM is helping with nausea. Omeprazole  once daily. BM every other day usually. Reported angioedema intermittently and was treated with prednisone . CTA A/P  ordered for evaluation of weight loss to rule out mesenteric ischemia, increase PPI to BID. Continue megace, high protein diet recommended. Start miralax a few times per week. Given nystatin  for thrush. Referral for allergy testing  given.   CTA A/P ordered/planned for early January and not performed.   Today:    Wt Readings from Last 5 Encounters:  12/17/23 96 lb 3.2 oz (43.6 kg)  06/23/23 99 lb 4.8 oz (45 kg)  05/22/23 95 lb (43.1 kg)  05/09/23 95 lb (43.1 kg)  05/08/23 96 lb (43.5 kg)    Current Outpatient Medications  Medication Sig Dispense Refill   acetaminophen  (TYLENOL ) 650 MG CR tablet Take 650 mg by mouth every 8 (eight) hours as needed for pain.     albuterol  (VENTOLIN  HFA) 108 (90 Base) MCG/ACT inhaler Inhale 2 puffs into the lungs every 4 (four) hours as needed for wheezing or shortness of breath. for wheezing 18 g 1   amLODipine  (NORVASC ) 5 MG tablet Take 5 mg by mouth daily.     baclofen (LIORESAL) 10 MG tablet Take 10 mg by mouth 2 (two) times daily.     budesonide -formoterol  (BREYNA ) 160-4.5 MCG/ACT inhaler Inhale 2 puffs into the lungs in the morning and at bedtime. 1 each 11   budesonide -formoterol  (SYMBICORT ) 160-4.5 MCG/ACT inhaler INHALE 2 PUFFS INTO THE LUNGS IN THE MORNING AND AT BEDTIME 10.2 g 6   busPIRone (BUSPAR) 5 MG tablet Take 5 mg by mouth. TAKE ONE TABLET BY MOUTH TWICE DAILY @ 9AM & 5PM FOR 90 DAYS     CAPLYTA 42 MG capsule Take 42 mg by mouth daily.     cetirizine (ZYRTEC) 10 MG tablet Take 1 tablet by mouth daily.     cyclobenzaprine (FLEXERIL) 10 MG tablet Take 10 mg by mouth 2 (two) times daily.     dicyclomine (BENTYL) 20 MG tablet Take 20 mg by mouth 4 (four) times daily.     famotidine  (PEPCID ) 20 MG tablet Take 1 tablet (20 mg total) by mouth 2 (two) times daily. 14 tablet 0   fluticasone  (FLONASE) 50 MCG/ACT nasal spray Place 2 sprays into both nostrils daily.     HYDROcodone -acetaminophen  (NORCO) 10-325 MG tablet Take 1 tablet by mouth 3 (three) times daily as needed for pain.     hydrOXYzine (ATARAX) 10 MG tablet Take 10 mg by mouth 2 (two) times daily as needed.     ipratropium (ATROVENT) 0.02 % nebulizer solution Inhale 0.5 mg into the lungs.     latanoprost  (XALATAN) 0.005 % ophthalmic solution 1 drop at bedtime.     levalbuterol (XOPENEX) 1.25 MG/3ML nebulizer solution Inhale 1.25 mg into the lungs.     losartan (COZAAR) 50 MG tablet Take 50 mg by mouth every morning.     MACROBID 100 MG capsule Take 100 mg by mouth 2 (two) times daily.     megestrol (MEGACE) 20 MG tablet Take 20 mg by mouth daily.     metFORMIN (GLUCOPHAGE) 500 MG tablet Take 500 mg by mouth 2 (two) times daily with a meal.     naloxone (NARCAN) nasal spray 4 mg/0.1 mL Place 0.4 mg into the nose once.     nitroGLYCERIN (NITROSTAT) 0.4 MG SL tablet Place 0.4 mg under the tongue every 5 (five) minutes as needed for chest pain.     omeprazole  (PRILOSEC) 40 MG capsule Take 1 capsule (40 mg total) by mouth 2 (two) times daily before a meal. TAKE 1 CAPSULE 30 TO 60  MINUTES BEFORE FIRST MEAL OF THE DAY 90 capsule 3   ondansetron (ZOFRAN) 4 MG tablet Take 4 mg by mouth daily.     predniSONE  (DELTASONE ) 10 MG tablet Take 2 tablets (20 mg total) by mouth daily. 14 tablet 0   pregabalin (LYRICA) 25 MG capsule Take 25 mg by mouth 2 (two) times daily.     rosuvastatin  (CRESTOR ) 20 MG tablet Take 20 mg by mouth at bedtime.     sertraline (ZOLOFT) 100 MG tablet Take 100 mg by mouth every morning.     No current facility-administered medications for this visit.    Past Medical History:  Diagnosis Date   Anxiety    Arthritis    Bipolar affective disorder (HCC)    CAD (coronary artery disease)    Chronic respiratory failure (HCC)    Complication of anesthesia    COPD (chronic obstructive pulmonary disease) (HCC)    Depression    DM (diabetes mellitus) (HCC)    Fibromyalgia    GERD (gastroesophageal reflux disease)    History of angioedema    HTN (hypertension)    Polymyalgia rheumatica (HCC)    PONV (postoperative nausea and vomiting)    Stroke [facility])     Past Surgical History:  Procedure Laterality Date   ABDOMINAL HYSTERECTOMY N/A    ANTERIOR FUSION CERVICAL SPINE  2012    C5-7   BACK SURGERY     BALLOON DILATION N/A 05/09/2023   Procedure: BALLOON DILATION;  Surgeon: Cindie Carlin POUR, DO;  [facility];  Service: Endoscopy;  Laterality: N/A;   BIOPSY  03/13/2022   Procedure: BIOPSY;  Surgeon: Shaaron Lamar HERO, MD;  [facility];  Service: Endoscopy;;   BIOPSY  05/09/2023   Procedure: BIOPSY;  Surgeon: Cindie Carlin POUR, DO;  [facility];  Service: Endoscopy;;   COLONOSCOPY WITH PROPOFOL  N/A 05/09/2023   Procedure: COLONOSCOPY WITH PROPOFOL ;  Surgeon: Cindie Carlin POUR, DO;  [facility];  Service: Endoscopy;  Laterality: N/A;  9:00 am, asa 3   ESOPHAGOGASTRODUODENOSCOPY (EGD) WITH PROPOFOL  N/A 03/13/2022   Procedure: ESOPHAGOGASTRODUODENOSCOPY (EGD) WITH PROPOFOL ;  Surgeon: Shaaron Lamar HERO, MD;  [facility];  Service: Endoscopy;  Laterality: N/A;  12:30pm   ESOPHAGOGASTRODUODENOSCOPY (EGD) WITH PROPOFOL  N/A 05/09/2023   Procedure: ESOPHAGOGASTRODUODENOSCOPY (EGD) WITH PROPOFOL ;  Surgeon: Cindie Carlin POUR, DO;  [facility];  Service: Endoscopy;  Laterality: N/A;   MALONEY DILATION N/A 03/13/2022   Procedure: AGAPITO DILATION;  Surgeon: Shaaron Lamar HERO, MD;  [facility];  Service: Endoscopy;  Laterality: N/A;   POLYPECTOMY  05/09/2023   Procedure: POLYPECTOMY;  Surgeon: Cindie Carlin POUR, DO;  [facility];  Service: Endoscopy;;   SHOULDER SURGERY      Family History  Problem Relation Age of Onset   Hypertension Mother    Breast cancer Sister    Colon cancer Neg Hx     Allergies as of 02/03/2024 - Review Complete 12/17/2023  Allergen Reaction Noted   Alendronate Other (See Comments) 02/08/2014   Ibuprofen Nausea And Vomiting 11/04/2013   Ramipril Nausea And Vomiting and Other (See Comments) 11/04/2013   Sulfabenzamide Other (See Comments) 11/04/2013    Social History   Socioeconomic History   Marital status: Single    Spouse name: Not on file   Number of  children: Not on file   Years of education: Not on file   Highest education level: Not on file  Occupational History  Not on file  Tobacco Use   Smoking status: Every Day    Current packs/day: 0.50    Average packs/day: 0.5 packs/day for 50.0 years (25.0 ttl pk-yrs)    Types: Cigarettes   Smokeless tobacco: Never  Vaping Use   Vaping status: Never Used  Substance and Sexual Activity   Alcohol use: Never   Drug use: Never   Sexual activity: Not Currently  Other Topics Concern   Not on file  Social History Narrative   Not on file   Social Drivers of Health   Financial Resource Strain: Medium Risk (08/13/2023)   Received from [facility]   Overall Financial Resource Strain (CARDIA)    Difficulty of Paying Living Expenses: Somewhat hard  Food Insecurity: No Food Insecurity (08/13/2023)   Received from [facility]   Hunger Vital Sign    Within the past 12 months, you worried that your food would run out before you got the money to buy more.: Never true    Within the past 12 months, the food you bought just didn't last and you didn't have money to get more.: Never true  Transportation Needs: Unmet Transportation Needs (08/13/2023)   Received from [facility]   PRAPARE - Transportation    Lack of Transportation (Medical): Yes    Lack of Transportation (Non-Medical): No  Physical Activity: Not on file  Stress: Not on file  Social Connections: Not on file     Review of Systems   Gen: Denies fever, chills, anorexia. Denies fatigue, weakness, weight loss.  CV: Denies chest pain, palpitations, syncope, peripheral edema, and claudication. Resp: Denies dyspnea at rest, cough, wheezing, coughing up blood, and pleurisy. GI: See HPI Derm: Denies rash, itching, dry skin Psych: Denies depression, anxiety, memory loss, confusion. No homicidal or suicidal ideation.  Heme: Denies bruising, bleeding, and enlarged lymph nodes.  Physical Exam   There were no vitals taken for  this visit.  General:   Alert and oriented. No distress noted. Pleasant and cooperative.  Head:  Normocephalic and atraumatic. Eyes:  Conjuctiva clear without scleral icterus. Mouth:  Oral mucosa pink and moist. Good dentition. No lesions. Lungs:  Clear to auscultation bilaterally. No wheezes, rales, or rhonchi. No distress.  Heart:  S1, S2 present without murmurs appreciated.  Abdomen:  +BS, soft, non-tender and non-distended. No rebound or guarding. No HSM or masses noted. Rectal: *** Msk:  Symmetrical without gross deformities. Normal posture. Extremities:  Without edema. Neurologic:  Alert and  oriented x4 Psych:  Alert and cooperative. Normal mood and affect.  Assessment  Candace Harris is a 73 y.o. female presenting today with ***  GERD, dysphagia, chronic epigastric pain, N/V:  Weight loss, lack of appetite, early satiety:  Constipation:  PLAN   *** CTA A/P? Continue omeprazole  40 mg BID  Follow up ***    Charmaine Melia, MSN, FNP-BC, AGACNP-BC [facility]

## 2024-02-03 ENCOUNTER — Ambulatory Visit: Admitting: Gastroenterology

## 2024-02-04 ENCOUNTER — Encounter: Payer: Self-pay | Admitting: Gastroenterology

## 2024-02-17 ENCOUNTER — Other Ambulatory Visit: Payer: Self-pay | Admitting: Internal Medicine

## 2024-03-09 NOTE — Progress Notes (Deleted)
 Candace Harris, female    DOB: 17-Jun-1951    MRN: [id number]   Brief patient profile:  22 yobf active smoker with GOLD I COPD by pfts 02/2019 transferred care to [facility] office 11/02/2020 p admit (see below)   From Dr Gretta 11/24/19 Elderly BF with a history of tobacco abuse and COPD who presents for evaluation of dyspnea on exertion.   She was diagnosed with COPD about 2 years ago but has had progressively worsening shortness of breath over the last 2 years.  She also has cough, sputum production, wheezing, but dyspnea on exertion is her main complaint.  Her activity is fairly limited; she is only able to walk a few steps around her house without stopping.   She is on 3 L supplemental oxygen  at home.  She was prescribed 2.5 L but had ongoing dyspnea on titrated up to 3 L.  Her home saturations are usually in the 90s, but sometimes drop into the 80s.  She is currently using her Trelegy inhaler daily with frequent albuterol .  She continues to smoke 0.25- 0.5 packs/day; she has smoked 0.5 ppd for the last 50 years.     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  Rec: trelegy Check echo: 12/15/19  G I diastolic dysfunction / no cor pulmonale    Stopped trelegy  mid Jan 2022 due irritated mouth   Admit date: 08/15/2020 Discharge date: 08/18/2020 Brief Hospitalization Summary: Please see all hospital notes, images, labs for full details of the hospitalization. ADMISSION HPI: [name] is a 73 y.o. female with medical history significant for HTN, CAD, anxiety, COPD (on 3-6 L of O2 at home), T2DM, GERD and bipolar disorder who presents to the emergency department due to 39-month onset of shortness of breath that has been progressively worsening, she complained of decreased oral intake due to loss of taste, she also endorsed loss of smell and she states that she had diarrhea for 2 to 3 weeks last month which has resolved.  She states that she saw her PCP in [place] who prescribed prednisone   for her, but she has not yet filled the prescription.  She decided to go to the ED for further evaluation due to persistent worsening shortness of breath and concern for dehydration.   ED Course: In the emergency department, she was tachycardic and intermittently tachypneic. Work-up in the ED showed leukocytosis, thrombocytosis, hypokalemia, hyponatremia, BUN/creatinine 41/1.70 (baseline creatinine was 0.7). D-dimer 0.78. Chest x-ray was reflective of emphysema but showed no active cardiopulmonary disease Breathing treatment was provided, Solu-Medrol  125 Mg x1 was given, IV Ativan  Librium x1 due to anxiety was given and patient was provided with 500 mL of IV NS. Hospitalist was asked to admit patient for further evaluation and management.   Hospital Course    Acute on chronic respiratory failure with hypoxia - secondary to COPD exacerbation.  Pt much improved after IV steroids, bronchodilators and supportive measures. .  Prednisone  taper.   Hypomagnesemia - IV replacement given and repleted.  Hypokalemia - repleted.  Essential hypertension - continue home meds GERD - protonix  for GI protection.  Type 2 DM - monitored and stable.   AKI - resolved after hydration.    Discharge Diagnoses:  Principal Problem:   Shortness of breath Active Problems:   AKI (acute kidney injury) (HCC)   Dehydration   Leukocytosis   Thrombocytosis   Essential hypertension   Elevated d-dimer   Hypokalemia   Anxiety   COPD  with acute exacerbation (HCC)   Hyperlipidemia   Diabetes mellitus (HCC)   GERD (gastroesophageal reflux disease)   CAD (coronary artery disease)        History of Present Illness  11/02/2020  Pulmonary/ 1st office eval/ Katti Pelle / [name] [facility] GOLD I copd with component of UACS/ anxiety  Chief Complaint  Patient presents with   Follow-up    Productive cough with white phlegm since January 2022  Dyspnea:  Can do 17 steps at appt complex but has to stop at top to recover / last  shopping x 2 y prior to OV   Cough: hocking min white daytime off gerd rx  Sleep: on side flat bed s resp symptoms as long as I have on my oxygen   SABA use: avg 6-8 x per day / no maint rx  02 2lpm 24/7 rec Plan A = Automatic = Always=    Breztri  Take 2 puffs first thing in am and then another 2 puffs about 12 hours later.  Work on inhaler technique: Plan B = Backup (to supplement plan A, not to replace it) Only use your albuterol  inhaler as a rescue medication Try prilosec 40mg   Take 30-60 min before first meal of the day and Pepcid  ac (famotidine ) 20 mg one after supper  Until return  GERD diet  Please schedule a follow up office visit in 6 weeks, call sooner if needed with all medications /inhalers/ solutions in hand so we can verify exactly what you are taking. This includes all medications from all doctors and over the counters          Patient admitted on: 08/13/2023     Chief Complaint  Patient presents with  Shortness of Breath  Day of admission HPI: August 13, 2023   Discharge  08/20/23   Patient admitted on Home O2? - yes Patient on home anticoagulant? - no Patient admitted with Chronic home foley catheter? - No Foley catheter placed or replaced by another service prior to admission? - No  Mental Status on Admission: The patient is Alert and oriented to PERSON The patient is Alert And oriented to TIME The patient is Alert and oriented to LOCATION  This is a 73 y.o. 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, DM, GERD, FM, depression, anxiety, PTSD presents to the emergency department for evaluation of one week of cough with progressively worsening SOB. She required CPAP then BiPAP and was started on IV antibiotics, nebulizer treatments, IV fluid, electrolyte replacement. Severe dyspnea at rest.  Acute hypoxic respiratory failure 2/2 exacerbation of COPD, tobacco abuse, sepsis with organ failure, rhinovirus, dyspnea Originally admitted to ICU; changed to  overflow 08/17/23 No longer needing BIPAP due to work of breathing; on 1L saturating upper 90s IV and inhaled Steroids, O2, nebs  Escalate antibiotics from Azithro/Ceftriaxone (received 3 days) to Cefepime/Doxy day 5; WBC 25.3 today; (getting IV steroids), procalcitonin negative (0.07) Positive for rhinovirus CTA 1/29 ruled out PE; 2 view CXR repeated 2/1, showed no active disease Counseled regarding tobacco cessation, nicotine patch available if needed *Sepsis on admission as evidenced by elevated white count 18.2, tachycardia up to 113 bpm, respiratory failure requiring BiPAP, source lung Lasix 2/3 and 2/4 with good results  AT d/c:  Pt is sitting up in bed resting. Cough has improved. She is wearing 1L of oxygen . Feeling much better, feels she is ready for discharge.       12/17/2023 post hosp f/u ov/[place] office/Tashyra Adduci re: GOLD 1  copd/ 02 dep/ anemia/ 02 dep  maint on symbicort160/ incruse   / still smoking some Chief Complaint  Patient presents with   Shortness of Breath  Dyspnea:  room to room  Cough: rattling / beige mucus > abx already called in  Sleeping: flat bed 3 pillows s   resp cc  SABA use: use p ex 02: 3lpm LCS: per [facility] per pt  Rec We will order humidity for your oxygen   Ok to try albuterol  15 min before an activity (on alternating days)  that you know would usually make you short of breath  Work on inhaler technique:   The key is to stop smoking completely before smoking completely stops you!  Please schedule a follow up visit in 3 months but call sooner if needed    03/10/2024  f/u ov/[place] office/Birt Reinoso re: *** maint on ***  No chief complaint on file.   Dyspnea:  *** Cough: *** Sleeping: ***   resp cc  SABA use: *** 02: ***  Lung cancer screening: ***   No obvious day to day or daytime variability or assoc excess/ purulent sputum or mucus plugs or hemoptysis or cp or chest tightness, subjective wheeze or overt sinus or hb symptoms.    Also denies any  obvious fluctuation of symptoms with weather or environmental changes or other aggravating or alleviating factors except as outlined above   No unusual exposure hx or h/o childhood pna/ asthma or knowledge of premature birth.  Current Allergies, Complete Past Medical History, Past Surgical History, Family History, and Social History were reviewed in [employer] record.  ROS  The following are not active complaints unless bolded Hoarseness, sore throat, dysphagia, dental problems, itching, sneezing,  nasal congestion or discharge of excess mucus or purulent secretions, ear ache,   fever, chills, sweats, unintended wt loss or wt gain, classically pleuritic or exertional cp,  orthopnea pnd or arm/hand swelling  or leg swelling, presyncope, palpitations, abdominal pain, anorexia, nausea, vomiting, diarrhea  or change in bowel habits or change in bladder habits, change in stools or change in urine, dysuria, hematuria,  rash, arthralgias, visual complaints, headache, numbness, weakness or ataxia or problems with walking or coordination,  change in mood or  memory.        No outpatient medications have been marked as taking for the 03/10/24 encounter (Appointment) with Darlean Ozell NOVAK, MD.                  Past Medical History:  Diagnosis Date   Anxiety    Bipolar affective disorder [facility])    CAD (coronary artery disease)    Chronic respiratory failure (HCC)    COPD (chronic obstructive pulmonary disease) (HCC)    DM (diabetes mellitus) (HCC)    GERD (gastroesophageal reflux disease)    HTN (hypertension)    Polymyalgia rheumatica (HCC)           Objective:    Wt  03/10/2024         ***  12/17/2023           96  05/08/2023       96  01/13/2023           112  12/02/2022        110  05/30/2022      107  04/23/2022      104  03/25/2022        103  09/24/2021        107 03/15/2021  107  12/14/20 107 lb 12.8 oz (48.9 kg)  11/02/20 112 lb 12.8 oz (51.2 kg)   08/15/20 132 lb 4.4 oz (60 kg)    Vital signs reviewed  03/10/2024  - Note at rest 02 sats  ***% on ***   General appearance:    ***  edentulous  Mild barr ***  Assessment

## 2024-03-10 ENCOUNTER — Encounter: Payer: Self-pay | Admitting: Internal Medicine

## 2024-03-10 ENCOUNTER — Ambulatory Visit: Payer: Medicare (Managed Care) | Admitting: Internal Medicine

## 2024-03-10 DIAGNOSIS — J449 Chronic obstructive pulmonary disease, unspecified: Secondary | ICD-10-CM

## 2024-03-10 DIAGNOSIS — J9611 Chronic respiratory failure with hypoxia: Secondary | ICD-10-CM

## 2024-03-16 ENCOUNTER — Other Ambulatory Visit: Payer: Self-pay | Admitting: Internal Medicine

## 2024-04-10 ENCOUNTER — Encounter (HOSPITAL_COMMUNITY): Payer: Self-pay | Admitting: Family Medicine

## 2024-04-10 ENCOUNTER — Inpatient Hospital Stay (HOSPITAL_COMMUNITY)
Admission: EM | Admit: 2024-04-10 | Discharge: 2024-04-13 | DRG: 189 | Disposition: A | Discharge status: Home Health | Attending: Family Medicine | Admitting: Family Medicine

## 2024-04-10 ENCOUNTER — Other Ambulatory Visit: Payer: Self-pay

## 2024-04-10 ENCOUNTER — Emergency Department (HOSPITAL_COMMUNITY)

## 2024-04-10 DIAGNOSIS — M353 Polymyalgia rheumatica: Secondary | ICD-10-CM | POA: Diagnosis present

## 2024-04-10 DIAGNOSIS — E876 Hypokalemia: Secondary | ICD-10-CM | POA: Diagnosis present

## 2024-04-10 DIAGNOSIS — E860 Dehydration: Secondary | ICD-10-CM | POA: Diagnosis present

## 2024-04-10 DIAGNOSIS — K219 Gastro-esophageal reflux disease without esophagitis: Secondary | ICD-10-CM | POA: Diagnosis present

## 2024-04-10 DIAGNOSIS — J9611 Chronic respiratory failure with hypoxia: Secondary | ICD-10-CM | POA: Diagnosis present

## 2024-04-10 DIAGNOSIS — N179 Acute kidney failure, unspecified: Secondary | ICD-10-CM | POA: Diagnosis present

## 2024-04-10 DIAGNOSIS — E785 Hyperlipidemia, unspecified: Secondary | ICD-10-CM | POA: Diagnosis present

## 2024-04-10 DIAGNOSIS — F4310 Post-traumatic stress disorder, unspecified: Secondary | ICD-10-CM | POA: Diagnosis present

## 2024-04-10 DIAGNOSIS — Z1152 Encounter for screening for COVID-19: Secondary | ICD-10-CM

## 2024-04-10 DIAGNOSIS — Z23 Encounter for immunization: Secondary | ICD-10-CM | POA: Diagnosis present

## 2024-04-10 DIAGNOSIS — Z7951 Long term (current) use of inhaled steroids: Secondary | ICD-10-CM

## 2024-04-10 DIAGNOSIS — Z9981 Dependence on supplemental oxygen: Secondary | ICD-10-CM | POA: Diagnosis not present

## 2024-04-10 DIAGNOSIS — J9621 Acute and chronic respiratory failure with hypoxia: Secondary | ICD-10-CM | POA: Diagnosis present

## 2024-04-10 DIAGNOSIS — J441 Chronic obstructive pulmonary disease with (acute) exacerbation: Secondary | ICD-10-CM | POA: Diagnosis present

## 2024-04-10 DIAGNOSIS — D509 Iron deficiency anemia, unspecified: Secondary | ICD-10-CM | POA: Diagnosis present

## 2024-04-10 DIAGNOSIS — F419 Anxiety disorder, unspecified: Secondary | ICD-10-CM | POA: Diagnosis present

## 2024-04-10 DIAGNOSIS — I2510 Atherosclerotic heart disease of native coronary artery without angina pectoris: Secondary | ICD-10-CM | POA: Diagnosis present

## 2024-04-10 DIAGNOSIS — J449 Chronic obstructive pulmonary disease, unspecified: Secondary | ICD-10-CM | POA: Diagnosis present

## 2024-04-10 DIAGNOSIS — R1310 Dysphagia, unspecified: Secondary | ICD-10-CM | POA: Diagnosis present

## 2024-04-10 DIAGNOSIS — M797 Fibromyalgia: Secondary | ICD-10-CM | POA: Diagnosis present

## 2024-04-10 DIAGNOSIS — E538 Deficiency of other specified B group vitamins: Secondary | ICD-10-CM | POA: Diagnosis present

## 2024-04-10 DIAGNOSIS — F411 Generalized anxiety disorder: Secondary | ICD-10-CM | POA: Diagnosis present

## 2024-04-10 DIAGNOSIS — Z7952 Long term (current) use of systemic steroids: Secondary | ICD-10-CM

## 2024-04-10 DIAGNOSIS — Z882 Allergy status to sulfonamides status: Secondary | ICD-10-CM

## 2024-04-10 DIAGNOSIS — R0609 Other forms of dyspnea: Secondary | ICD-10-CM | POA: Diagnosis present

## 2024-04-10 DIAGNOSIS — Z8673 Personal history of transient ischemic attack (TIA), and cerebral infarction without residual deficits: Secondary | ICD-10-CM

## 2024-04-10 DIAGNOSIS — Z79891 Long term (current) use of opiate analgesic: Secondary | ICD-10-CM

## 2024-04-10 DIAGNOSIS — F1190 Opioid use, unspecified, uncomplicated: Secondary | ICD-10-CM | POA: Diagnosis present

## 2024-04-10 DIAGNOSIS — D72829 Elevated white blood cell count, unspecified: Secondary | ICD-10-CM | POA: Diagnosis present

## 2024-04-10 DIAGNOSIS — F17210 Nicotine dependence, cigarettes, uncomplicated: Secondary | ICD-10-CM | POA: Diagnosis present

## 2024-04-10 DIAGNOSIS — F319 Bipolar disorder, unspecified: Secondary | ICD-10-CM | POA: Diagnosis present

## 2024-04-10 DIAGNOSIS — I10 Essential (primary) hypertension: Secondary | ICD-10-CM | POA: Diagnosis present

## 2024-04-10 DIAGNOSIS — Z8249 Family history of ischemic heart disease and other diseases of the circulatory system: Secondary | ICD-10-CM

## 2024-04-10 DIAGNOSIS — Z888 Allergy status to other drugs, medicaments and biological substances status: Secondary | ICD-10-CM

## 2024-04-10 DIAGNOSIS — E119 Type 2 diabetes mellitus without complications: Secondary | ICD-10-CM | POA: Diagnosis present

## 2024-04-10 DIAGNOSIS — Z7984 Long term (current) use of oral hypoglycemic drugs: Secondary | ICD-10-CM

## 2024-04-10 DIAGNOSIS — M159 Polyosteoarthritis, unspecified: Secondary | ICD-10-CM | POA: Diagnosis present

## 2024-04-10 DIAGNOSIS — Z803 Family history of malignant neoplasm of breast: Secondary | ICD-10-CM

## 2024-04-10 DIAGNOSIS — T380X5A Adverse effect of glucocorticoids and synthetic analogues, initial encounter: Secondary | ICD-10-CM | POA: Diagnosis present

## 2024-04-10 DIAGNOSIS — Z886 Allergy status to analgesic agent status: Secondary | ICD-10-CM

## 2024-04-10 DIAGNOSIS — Z79899 Other long term (current) drug therapy: Secondary | ICD-10-CM

## 2024-04-10 LAB — CBC
HCT: 33.7 % — ABNORMAL LOW (ref 36.0–46.0)
HCT: 32.3 % — ABNORMAL LOW (ref 36.0–46.0)
Hemoglobin: 10.4 g/dL — ABNORMAL LOW (ref 12.0–15.0)
Hemoglobin: 10 g/dL — ABNORMAL LOW (ref 12.0–15.0)
MCH: 26.2 pg (ref 26.0–34.0)
MCH: 26.1 pg (ref 26.0–34.0)
MCHC: 30.9 g/dL (ref 30.0–36.0)
MCHC: 31 g/dL (ref 30.0–36.0)
MCV: 84.9 fL (ref 80.0–100.0)
MCV: 84.3 fL (ref 80.0–100.0)
Platelets: 345 K/uL (ref 150–400)
Platelets: 375 K/uL (ref 150–400)
RBC: 3.97 MIL/uL (ref 3.87–5.11)
RBC: 3.83 MIL/uL — ABNORMAL LOW (ref 3.87–5.11)
RDW: 16.1 % — ABNORMAL HIGH (ref 11.5–15.5)
RDW: 16.1 % — ABNORMAL HIGH (ref 11.5–15.5)
WBC: 19.9 K/uL — ABNORMAL HIGH (ref 4.0–10.5)
WBC: 22.3 K/uL — ABNORMAL HIGH (ref 4.0–10.5)
nRBC: 0 % (ref 0.0–0.2)
nRBC: 0.1 % (ref 0.0–0.2)

## 2024-04-10 LAB — HEMOGLOBIN A1C
Hgb A1c MFr Bld: 6.1 % — ABNORMAL HIGH (ref 4.8–5.6)
Mean Plasma Glucose: 128.37 mg/dL

## 2024-04-10 LAB — GLUCOSE, CAPILLARY
Glucose-Capillary: 142 mg/dL — ABNORMAL HIGH (ref 70–99)
Glucose-Capillary: 188 mg/dL — ABNORMAL HIGH (ref 70–99)

## 2024-04-10 LAB — MAGNESIUM: Magnesium: 1.9 mg/dL (ref 1.7–2.4)

## 2024-04-10 LAB — CREATININE, SERUM
Creatinine, Ser: 1.15 mg/dL — ABNORMAL HIGH (ref 0.44–1.00)
GFR, Estimated: 51 mL/min — ABNORMAL LOW (ref >60)

## 2024-04-10 LAB — RESP PANEL BY RT-PCR (RSV, FLU A&B, COVID)  RVPGX2
Influenza A by PCR: NEGATIVE
Influenza B by PCR: NEGATIVE
Resp Syncytial Virus by PCR: NEGATIVE
SARS Coronavirus 2 by RT PCR: NEGATIVE

## 2024-04-10 LAB — PROCALCITONIN: Procalcitonin: 0.38 ng/mL

## 2024-04-10 LAB — DIFFERENTIAL
Abs Immature Granulocytes: 0.1 K/uL — ABNORMAL HIGH (ref 0.00–0.07)
Basophils Absolute: 0 K/uL (ref 0.0–0.1)
Basophils Relative: 0 %
Eosinophils Absolute: 0 K/uL (ref 0.0–0.5)
Eosinophils Relative: 0 %
Immature Granulocytes: 1 %
Lymphocytes Relative: 7 %
Lymphs Abs: 1.3 K/uL (ref 0.7–4.0)
Monocytes Absolute: 1.5 K/uL — ABNORMAL HIGH (ref 0.1–1.0)
Monocytes Relative: 7 %
Neutro Abs: 17.1 K/uL — ABNORMAL HIGH (ref 1.7–7.7)
Neutrophils Relative %: 85 %

## 2024-04-10 LAB — BASIC METABOLIC PANEL WITH GFR
Anion gap: 19 — ABNORMAL HIGH (ref 5–15)
BUN: 15 mg/dL (ref 8–23)
CO2: 27 mmol/L (ref 22–32)
Calcium: 9 mg/dL (ref 8.9–10.3)
Chloride: 88 mmol/L — ABNORMAL LOW (ref 98–111)
Creatinine, Ser: 1.22 mg/dL — ABNORMAL HIGH (ref 0.44–1.00)
GFR, Estimated: 47 mL/min — ABNORMAL LOW (ref >60)
Glucose, Bld: 95 mg/dL (ref 70–99)
Potassium: 2.6 mmol/L — CL (ref 3.5–5.1)
Sodium: 134 mmol/L — ABNORMAL LOW (ref 135–145)

## 2024-04-10 LAB — TROPONIN I (HIGH SENSITIVITY): Troponin I (High Sensitivity): 9 ng/L (ref <18)

## 2024-04-10 MED ORDER — MAGNESIUM SULFATE IN D5W 1-5 GM/100ML-% IV SOLN
1.0000 g | Freq: Once | INTRAVENOUS | Status: AC
  Administered 2024-04-10: 1 g via INTRAVENOUS
  Filled 2024-04-10: qty 100

## 2024-04-10 MED ORDER — ALBUTEROL SULFATE (2.5 MG/3ML) 0.083% IN NEBU: 2.5000 mg | INHALATION_SOLUTION | Freq: Once | RESPIRATORY_TRACT | Status: AC

## 2024-04-10 MED ORDER — ALBUTEROL SULFATE (2.5 MG/3ML) 0.083% IN NEBU: 2.5000 mg | INHALATION_SOLUTION | RESPIRATORY_TRACT | Status: DC | PRN

## 2024-04-10 MED ORDER — PANTOPRAZOLE SODIUM 40 MG PO TBEC
40.0000 mg | DELAYED_RELEASE_TABLET | Freq: Every evening | ORAL | Status: DC
  Administered 2024-04-10 – 2024-04-12 (×3): 40 mg via ORAL
  Filled 2024-04-10 (×3): qty 1

## 2024-04-10 MED ORDER — NICOTINE 14 MG/24HR TD PT24
14.0000 mg | MEDICATED_PATCH | Freq: Every day | TRANSDERMAL | Status: DC
Start: 2024-04-10 — End: 2024-04-13
  Administered 2024-04-11 – 2024-04-13 (×3): 14 mg via TRANSDERMAL
  Filled 2024-04-10 (×3): qty 1

## 2024-04-10 MED ORDER — ACETAMINOPHEN 650 MG RE SUPP: 650.0000 mg | Freq: Four times a day (QID) | RECTAL | Status: DC | PRN

## 2024-04-10 MED ORDER — INFLUENZA VAC SPLIT HIGH-DOSE 0.5 ML IM SUSY
0.5000 mL | PREFILLED_SYRINGE | INTRAMUSCULAR | Status: AC
  Administered 2024-04-13: 0.5 mL via INTRAMUSCULAR
  Filled 2024-04-10: qty 0.5

## 2024-04-10 MED ORDER — ONDANSETRON HCL 4 MG/2ML IJ SOLN: 4.0000 mg | Freq: Four times a day (QID) | INTRAMUSCULAR | Status: DC | PRN

## 2024-04-10 MED ORDER — SODIUM CHLORIDE 0.9% FLUSH
3.0000 mL | Freq: Once | INTRAVENOUS | Status: AC
  Administered 2024-04-10: 3 mL via INTRAVENOUS

## 2024-04-10 MED ORDER — LACTATED RINGERS IV BOLUS: 1000.0000 mL | Freq: Once | INTRAVENOUS | Status: DC

## 2024-04-10 MED ORDER — CLONAZEPAM 0.5 MG PO TABS
0.5000 mg | ORAL_TABLET | Freq: Two times a day (BID) | ORAL | Status: DC
  Administered 2024-04-10 – 2024-04-13 (×6): 0.5 mg via ORAL
  Filled 2024-04-10 (×7): qty 1

## 2024-04-10 MED ORDER — BUSPIRONE HCL 5 MG PO TABS
5.0000 mg | ORAL_TABLET | Freq: Two times a day (BID) | ORAL | Status: DC
  Administered 2024-04-10 – 2024-04-13 (×6): 5 mg via ORAL
  Filled 2024-04-10 (×6): qty 1

## 2024-04-10 MED ORDER — AZITHROMYCIN 250 MG PO TABS
250.0000 mg | ORAL_TABLET | Freq: Every day | ORAL | Status: DC
  Administered 2024-04-11 – 2024-04-13 (×3): 250 mg via ORAL
  Filled 2024-04-10 (×3): qty 1

## 2024-04-10 MED ORDER — ENOXAPARIN SODIUM 30 MG/0.3ML IJ SOSY
30.0000 mg | PREFILLED_SYRINGE | INTRAMUSCULAR | Status: DC
  Administered 2024-04-10 – 2024-04-12 (×3): 30 mg via SUBCUTANEOUS
  Filled 2024-04-10 (×3): qty 0.3

## 2024-04-10 MED ORDER — FLUTICASONE FUROATE-VILANTEROL 200-25 MCG/ACT IN AEPB
1.0000 | INHALATION_SPRAY | Freq: Every day | RESPIRATORY_TRACT | Status: DC
  Administered 2024-04-11 – 2024-04-13 (×3): 1 via RESPIRATORY_TRACT
  Filled 2024-04-10: qty 28

## 2024-04-10 MED ORDER — DICYCLOMINE HCL 10 MG PO CAPS: 20.0000 mg | ORAL_CAPSULE | Freq: Four times a day (QID) | ORAL | Status: DC | PRN

## 2024-04-10 MED ORDER — NITROGLYCERIN 0.4 MG SL SUBL: 0.4000 mg | SUBLINGUAL_TABLET | SUBLINGUAL | Status: DC | PRN

## 2024-04-10 MED ORDER — FENTANYL CITRATE (PF) 100 MCG/2ML IJ SOLN: 12.5000 ug | INTRAMUSCULAR | Status: DC | PRN

## 2024-04-10 MED ORDER — METHYLPREDNISOLONE SODIUM SUCC 40 MG IJ SOLR
40.0000 mg | Freq: Two times a day (BID) | INTRAMUSCULAR | Status: DC
  Administered 2024-04-10 – 2024-04-13 (×6): 40 mg via INTRAVENOUS
  Filled 2024-04-10 (×6): qty 1

## 2024-04-10 MED ORDER — ALBUTEROL SULFATE (2.5 MG/3ML) 0.083% IN NEBU
INHALATION_SOLUTION | RESPIRATORY_TRACT | Status: AC
  Administered 2024-04-10: 2.5 mg via RESPIRATORY_TRACT
  Filled 2024-04-10: qty 3

## 2024-04-10 MED ORDER — LATANOPROST 0.005 % OP SOLN
1.0000 [drp] | Freq: Every day | OPHTHALMIC | Status: DC
  Administered 2024-04-10 – 2024-04-12 (×2): 1 [drp] via OPHTHALMIC
  Filled 2024-04-10: qty 2.5

## 2024-04-10 MED ORDER — GUAIFENESIN ER 600 MG PO TB12: 600.0000 mg | ORAL_TABLET | Freq: Two times a day (BID) | ORAL | Status: DC

## 2024-04-10 MED ORDER — POTASSIUM CHLORIDE 2 MEQ/ML IV SOLN
INTRAVENOUS | Status: DC
  Filled 2024-04-10 (×2): qty 1000

## 2024-04-10 MED ORDER — POTASSIUM CHLORIDE 10 MEQ/100ML IV SOLN
10.0000 meq | INTRAVENOUS | Status: AC
  Administered 2024-04-10 (×3): 10 meq via INTRAVENOUS
  Filled 2024-04-10 (×3): qty 100

## 2024-04-10 MED ORDER — AMLODIPINE BESYLATE 5 MG PO TABS
5.0000 mg | ORAL_TABLET | Freq: Every day | ORAL | Status: DC
  Filled 2024-04-10 (×2): qty 1

## 2024-04-10 MED ORDER — ONDANSETRON HCL 4 MG PO TABS: 4.0000 mg | ORAL_TABLET | Freq: Four times a day (QID) | ORAL | Status: DC | PRN

## 2024-04-10 MED ORDER — OXYCODONE HCL 5 MG PO TABS: 5.0000 mg | ORAL_TABLET | Freq: Four times a day (QID) | ORAL | Status: DC | PRN

## 2024-04-10 MED ORDER — ROSUVASTATIN CALCIUM 20 MG PO TABS
20.0000 mg | ORAL_TABLET | Freq: Every day | ORAL | Status: DC
  Administered 2024-04-10 – 2024-04-12 (×3): 20 mg via ORAL
  Filled 2024-04-10 (×3): qty 1

## 2024-04-10 MED ORDER — LACTATED RINGERS IV SOLN: INTRAVENOUS | Status: AC

## 2024-04-10 MED ORDER — ACETAMINOPHEN 325 MG PO TABS: 650.0000 mg | ORAL_TABLET | Freq: Four times a day (QID) | ORAL | Status: DC | PRN

## 2024-04-10 MED ORDER — ALBUTEROL SULFATE HFA 108 (90 BASE) MCG/ACT IN AERS: 2.0000 | INHALATION_SPRAY | RESPIRATORY_TRACT | Status: DC | PRN

## 2024-04-10 MED ORDER — AZITHROMYCIN 250 MG PO TABS
500.0000 mg | ORAL_TABLET | Freq: Once | ORAL | Status: AC
  Administered 2024-04-10: 500 mg via ORAL
  Filled 2024-04-10: qty 2

## 2024-04-10 MED ORDER — SERTRALINE HCL 50 MG PO TABS
100.0000 mg | ORAL_TABLET | Freq: Every morning | ORAL | Status: DC
  Administered 2024-04-11 – 2024-04-13 (×3): 100 mg via ORAL
  Filled 2024-04-10 (×3): qty 2

## 2024-04-10 MED ORDER — BISACODYL 5 MG PO TBEC: 5.0000 mg | DELAYED_RELEASE_TABLET | Freq: Every day | ORAL | Status: DC | PRN

## 2024-04-10 MED ORDER — INSULIN ASPART 100 UNIT/ML IJ SOLN
0.0000 [IU] | Freq: Three times a day (TID) | INTRAMUSCULAR | Status: DC
  Administered 2024-04-12 (×2): 1 [IU] via SUBCUTANEOUS

## 2024-04-10 MED ORDER — SODIUM CHLORIDE 0.9 % IV SOLN
1.0000 g | Freq: Once | INTRAVENOUS | Status: AC
  Administered 2024-04-10: 1 g via INTRAVENOUS
  Filled 2024-04-10: qty 10

## 2024-04-10 MED ORDER — SODIUM CHLORIDE 0.9 % IV SOLN
1.0000 g | INTRAVENOUS | Status: DC
  Administered 2024-04-11 – 2024-04-12 (×2): 1 g via INTRAVENOUS
  Filled 2024-04-10 (×2): qty 10

## 2024-04-10 MED ORDER — IPRATROPIUM-ALBUTEROL 0.5-2.5 (3) MG/3ML IN SOLN
3.0000 mL | Freq: Four times a day (QID) | RESPIRATORY_TRACT | Status: DC
  Administered 2024-04-10 – 2024-04-11 (×4): 3 mL via RESPIRATORY_TRACT
  Filled 2024-04-10 (×2): qty 3

## 2024-04-10 MED ORDER — POTASSIUM CHLORIDE CRYS ER 20 MEQ PO TBCR
40.0000 meq | EXTENDED_RELEASE_TABLET | Freq: Once | ORAL | Status: AC
  Administered 2024-04-10: 40 meq via ORAL
  Filled 2024-04-10: qty 2

## 2024-04-10 NOTE — H&P (Signed)
 History and Physical  [facility]  [name] FMW:[id number] DOB: [birth year] DOA: 04/10/2024  PCP: Franco, [facility]  Patient coming from: Home by [facility]  Level of care: Med-Surg  I have personally briefly reviewed patient's old medical records in [facility] Link  Chief Complaint: SOB   HPI: Candace Harris is a 73 year old female with chronic hypoxic respiratory failure chronically on 3 L oxygen , tobacco abuse, GAD, history of PTSD, type 2 diabetes mellitus, fibromyalgia and depression, hypertension, COPD, GERD presented to the ED by [facility] for difficulty breathing.  Patient reports that she has had a difficult time breathing more than normal over the past several days and reports she has had malaise and feeling unwell for the past week with poor appetite and significant coughing and wheezing.  Her cough has been productive of a thick greenish sputum.  EMS found her at home on 4 L nasal cannula but oxygenating at 87%.  They found her to be dyspneic and having physical exam findings of severe diffuse wheezing.  She was given a dose of IV Solu-Medrol  and route and albuterol  nebulizer treatment.  She presented with a leukocytosis, she was afebrile, her labs revealed a mild AKI and hypokalemia with a potassium of 2.6.  Chest x-ray with no acute findings reported.  ED provider reported purulent sputum production and started her on broad-spectrum antibiotic coverage for CAP.  Admission was requested for treatment of acute COPD exacerbation with acute on chronic respiratory failure.   Past Medical History:  Diagnosis Date   Anxiety    Arthritis    Bipolar affective disorder (HCC)    CAD (coronary artery disease)    Chronic respiratory failure (HCC)    Complication of anesthesia    COPD (chronic obstructive pulmonary disease) (HCC)    Depression    DM (diabetes mellitus) (HCC)    Fibromyalgia    GERD (gastroesophageal reflux disease)    History of angioedema    HTN (hypertension)     Polymyalgia rheumatica    PONV (postoperative nausea and vomiting)    Stroke [facility])     Past Surgical History:  Procedure Laterality Date   ABDOMINAL HYSTERECTOMY N/A    ANTERIOR FUSION CERVICAL SPINE  2012   C5-7   BACK SURGERY     BALLOON DILATION N/A 05/09/2023   Procedure: BALLOON DILATION;  Surgeon: Cindie Carlin POUR, DO;  [facility];  Service: Endoscopy;  Laterality: N/A;   BIOPSY  03/13/2022   Procedure: BIOPSY;  Surgeon: Shaaron Lamar HERO, MD;  [facility];  Service: Endoscopy;;   BIOPSY  05/09/2023   Procedure: BIOPSY;  Surgeon: Cindie Carlin POUR, DO;  [facility];  Service: Endoscopy;;   COLONOSCOPY WITH PROPOFOL  N/A 05/09/2023   Procedure: COLONOSCOPY WITH PROPOFOL ;  Surgeon: Cindie Carlin POUR, DO;  [facility];  Service: Endoscopy;  Laterality: N/A;  9:00 am, asa 3   ESOPHAGOGASTRODUODENOSCOPY (EGD) WITH PROPOFOL  N/A 03/13/2022   Procedure: ESOPHAGOGASTRODUODENOSCOPY (EGD) WITH PROPOFOL ;  Surgeon: Shaaron Lamar HERO, MD;  [facility];  Service: Endoscopy;  Laterality: N/A;  12:30pm   ESOPHAGOGASTRODUODENOSCOPY (EGD) WITH PROPOFOL  N/A 05/09/2023   Procedure: ESOPHAGOGASTRODUODENOSCOPY (EGD) WITH PROPOFOL ;  Surgeon: Cindie Carlin POUR, DO;  [facility];  Service: Endoscopy;  Laterality: N/A;   MALONEY DILATION N/A 03/13/2022   Procedure: AGAPITO DILATION;  Surgeon: Shaaron Lamar HERO, MD;  [facility];  Service: Endoscopy;  Laterality: N/A;   POLYPECTOMY  05/09/2023  Procedure: POLYPECTOMY;  Surgeon: Cindie Carlin POUR, DO;  [facility];  Service: Endoscopy;;   SHOULDER SURGERY       reports that she has been smoking cigarettes. She has a 25 pack-year smoking history. She has never used smokeless tobacco. She reports that she does not drink alcohol and does not use drugs.  Allergies  Allergen Reactions   Alendronate Other (See Comments)    Sore muscles, burning in chest Sore muscles,  burning in chest    Ibuprofen Nausea And Vomiting    Other reaction(s): Unknown    Ramipril Nausea And Vomiting and Other (See Comments)   Sulfabenzamide Other (See Comments)    HEADACHE    Duloxetine     Other Reaction(s): shaking   Lumateperone Nausea And Vomiting   Varenicline Nausea And Vomiting    Family History  Problem Relation Age of Onset   Hypertension Mother    Breast cancer Sister    Colon cancer Neg Hx     Prior to Admission medications   Medication Sig Start Date End Date Taking? Authorizing Provider  albuterol  (VENTOLIN  HFA) 108 (90 Base) MCG/ACT inhaler Inhale 2 puffs into the lungs every 4 (four) hours as needed for wheezing or shortness of breath. for wheezing 08/30/22  Yes Darlean Ozell NOVAK, MD  amLODipine  (NORVASC ) 5 MG tablet Take 5 mg by mouth daily. 12/19/16  Yes [provider]  baclofen (LIORESAL) 10 MG tablet Take 10 mg by mouth 2 (two) times daily. 01/27/23  Yes [provider]  budesonide -formoterol  (SYMBICORT ) 160-4.5 MCG/ACT inhaler INHALE 2 PUFFS INTO THE LUNGS IN THE MORNING AND AT BEDTIME 05/06/23  Yes Darlean Ozell NOVAK, MD  busPIRone (BUSPAR) 5 MG tablet Take 5 mg by mouth. TAKE ONE TABLET BY MOUTH TWICE DAILY @ 9AM & 5PM FOR 90 DAYS 04/21/23  Yes [provider]  clonazePAM (KLONOPIN) 0.5 MG tablet Take 0.5 mg by mouth 2 (two) times daily.   Yes [provider]  dicyclomine (BENTYL) 20 MG tablet Take 20 mg by mouth 4 (four) times daily. 06/17/20  Yes [provider]  famotidine  (PEPCID ) 20 MG tablet TAKE ONE TABLET BY MOUTH DAILY AFTER SUPPER 03/16/24  Yes Wert, Michael B, MD  latanoprost (XALATAN) 0.005 % ophthalmic solution 1 drop at bedtime. 03/31/23  Yes [provider]  levalbuterol (XOPENEX) 1.25 MG/3ML nebulizer solution Inhale 1.25 mg into the lungs. 08/20/23 08/19/24 Yes [provider]  losartan (COZAAR) 50 MG tablet Take 50 mg by mouth every morning. 02/21/23  Yes [provider]   metFORMIN (GLUCOPHAGE) 500 MG tablet Take 500 mg by mouth 2 (two) times daily with a meal. 08/13/16  Yes [provider]  naloxone (NARCAN) nasal spray 4 mg/0.1 mL Place 0.4 mg into the nose once.   Yes [provider]  nitroGLYCERIN (NITROSTAT) 0.4 MG SL tablet Place 0.4 mg under the tongue every 5 (five) minutes as needed for chest pain. 06/17/20  Yes [provider]  omeprazole  (PRILOSEC) 40 MG capsule Take 1 capsule (40 mg total) by mouth 2 (two) times daily before a meal. TAKE 1 CAPSULE 30 TO 60 MINUTES BEFORE FIRST MEAL OF THE DAY 06/23/23  Yes Mahon, Courtney L, NP  ondansetron (ZOFRAN) 4 MG tablet Take 4 mg by mouth daily. 11/11/19  Yes [provider]  oxyCODONE-acetaminophen  (PERCOCET) 7.5-325 MG tablet Take 1 tablet by mouth 4 (four) times daily as needed. 03/20/24  Yes [provider]  rosuvastatin  (CRESTOR ) 20 MG tablet Take  20 mg by mouth at bedtime. 06/12/20  Yes [provider]  sertraline (ZOLOFT) 100 MG tablet Take 100 mg by mouth every morning. 02/21/23  Yes [provider]  acetaminophen  (TYLENOL ) 650 MG CR tablet Take 650 mg by mouth every 8 (eight) hours as needed for pain.    [provider]  cetirizine (ZYRTEC) 10 MG tablet Take 1 tablet by mouth daily. 04/29/23 05/29/23  [provider]  cyclobenzaprine (FLEXERIL) 10 MG tablet Take 10 mg by mouth 2 (two) times daily. 06/17/20   [provider]  fluticasone  (FLONASE) 50 MCG/ACT nasal spray Place 2 sprays into both nostrils daily.    [provider]  hydrOXYzine (ATARAX) 10 MG tablet Take 10 mg by mouth 2 (two) times daily as needed. 03/03/23   [provider]  ipratropium (ATROVENT) 0.02 % nebulizer solution Inhale 0.5 mg into the lungs. 08/20/23 08/19/24  [provider]  MACROBID 100 MG capsule Take 100 mg by mouth 2 (two) times daily. 12/16/23   [provider]  megestrol (MEGACE) 20 MG tablet Take 20 mg by mouth  daily.    [provider]  predniSONE  (DELTASONE ) 10 MG tablet Take 2 tablets (20 mg total) by mouth daily. 09/18/23   Zammit, Joseph, MD  pregabalin (LYRICA) 25 MG capsule Take 25 mg by mouth 2 (two) times daily. 02/27/23   [provider]    Physical Exam: Vitals:   04/10/24 1230 04/10/24 1300 04/10/24 1327 04/10/24 1330  BP: 93/74 92/63 (!) 110/91 103/79  Pulse: 90 88    Resp: 18 13 17 13   Temp:      TempSrc:      SpO2: 100% 96% 94%   Weight:      Height:        Constitutional: NAD, calm, comfortable Eyes: PERRL, lids and conjunctivae normal ENMT: Mucous membranes are moist. Posterior pharynx clear of any exudate or lesions.  Neck: normal, supple, no masses, no thyromegaly Respiratory:diffuse wheezing and moderate increased work of breathing.   Cardiovascular: normal s1, s2 sounds, no murmurs / rubs / gallops. No extremity edema. 2+ pedal pulses. No carotid bruits.  Abdomen: no tenderness, no masses palpated. No hepatosplenomegaly. Bowel sounds positive.  Musculoskeletal: no clubbing / cyanosis. No joint deformity upper and lower extremities. Good ROM, no contractures. Normal muscle tone.  Skin: no rashes, lesions, ulcers. No induration Neurologic: CN 2-12 grossly intact. Sensation intact, DTR normal. Strength 5/5 in all 4.  Psychiatric: Normal judgment and insight. Alert and oriented x 3. Normal mood.   Labs on Admission: I have personally reviewed following labs and imaging studies  CBC: Recent Labs  Lab 04/10/24 1228 04/10/24 1312 04/10/24 1448  WBC 19.9*  --  22.3*  NEUTROABS  --  17.1*  --   HGB 10.4*  --  10.0*  HCT 33.7*  --  32.3*  MCV 84.9  --  84.3  PLT 345  --  375   Basic Metabolic Panel: Recent Labs  Lab 04/10/24 1228 04/10/24 1312  NA 134*  --   K 2.6*  --   CL 88*  --   CO2 27  --   GLUCOSE 95  --   BUN 15  --   CREATININE 1.22*  --   CALCIUM  9.0  --   MG  --  1.9   GFR: Estimated Creatinine Clearance: 28.7 mL/min (A) (by  C-G formula based on SCr of 1.22 mg/dL (H)). Liver Function Tests: No results for input(s): AST,  ALT, ALKPHOS, BILITOT, PROT, ALBUMIN  in the last 168 hours. No results for input(s): LIPASE, AMYLASE in the last 168 hours. No results for input(s): AMMONIA in the last 168 hours. Coagulation Profile: No results for input(s): INR, PROTIME in the last 168 hours. Cardiac Enzymes: No results for input(s): CKTOTAL, CKMB, CKMBINDEX, TROPONINI in the last 168 hours. BNP (last 3 results) No results for input(s): PROBNP in the last 8760 hours. HbA1C: No results for input(s): HGBA1C in the last 72 hours. CBG: No results for input(s): GLUCAP in the last 168 hours. Lipid Profile: No results for input(s): CHOL, HDL, LDLCALC, TRIG, CHOLHDL, LDLDIRECT in the last 72 hours. Thyroid  Function Tests: No results for input(s): TSH, T4TOTAL, FREET4, T3FREE, THYROIDAB in the last 72 hours. Anemia Panel: No results for input(s): VITAMINB12, FOLATE, FERRITIN, TIBC, IRON, RETICCTPCT in the last 72 hours. Urine analysis:    Component Value Date/Time   COLORURINE YELLOW 07/15/2020 1043   APPEARANCEUR CLEAR 07/15/2020 1043   LABSPEC 1.004 (L) 07/15/2020 1043   PHURINE 7.0 07/15/2020 1043   GLUCOSEU NEGATIVE 07/15/2020 1043   HGBUR NEGATIVE 07/15/2020 1043   BILIRUBINUR NEGATIVE 07/15/2020 1043   KETONESUR NEGATIVE 07/15/2020 1043   PROTEINUR NEGATIVE 07/15/2020 1043   NITRITE NEGATIVE 07/15/2020 1043   LEUKOCYTESUR NEGATIVE 07/15/2020 1043    Radiological Exams on Admission: DG Chest Port 1 View Result Date: 04/10/2024 CLINICAL DATA:  Wheezing, difficulty breathing EXAM: PORTABLE CHEST 1 VIEW COMPARISON:  08/16/2023, 08/13/2023 FINDINGS: Single frontal view of the chest demonstrates a stable cardiac silhouette. Stable prominence of the central pulmonary vasculature. No acute airspace disease, effusion, or pneumothorax. No acute bony  abnormalities. IMPRESSION: 1. Chronic prominence of the central pulmonary vascular consistent with pulmonary arterial hypertension. 2. No acute airspace disease. Electronically Signed   By: Ozell Daring M.D.   On: 04/10/2024 12:54    EKG: Independently reviewed. NSR  Assessment/Plan Principal Problem:   Acute and chronic respiratory failure with hypoxia (HCC) Active Problems:   DOE (dyspnea on exertion)   Dehydration   Leukocytosis   Essential hypertension   Hypokalemia   Anxiety   COPD with acute exacerbation (HCC)   Hyperlipidemia   Diabetes mellitus (HCC)   GERD (gastroesophageal reflux disease)   CAD (coronary artery disease)   COPD GOLD 1    Chronic respiratory failure with hypoxia (HCC)   Cigarette smoker   Dysphagia   PTSD (post-traumatic stress disorder)   B12 deficiency   Fibromyalgia   Chronic, continuous use of opioids   Iron deficiency anemia   Acute on chronic respiratory failure with hypoxia -- Secondary to acute COPD exacerbation -- Continue supplemental oxygen  support -- Continue aggressive COPD exacerbation treatments  Acute COPD exacerbation -- Continue IV steroids as ordered -- Continue antibiotics ordered: ceftriaxone and azithromycin  -- schedule bronchodilators -- add flutter valve   Leukocytosis  -- pt received IV solumedrol 125 mg en route to ED by EMS -- anticipating steroid induced leukemoid reaction -- continue antibiotics as ordered -- follow blood cultures -- procalcitonin pending  Dehydration AKI -- pt reports very poor oral intake over last month  -- IV fluid hydration  -- recheck BMP in AM   Hypokalemia - severe -- IV replacement ordered -- IV magnesium  ordered -- follow BMP   GERD -- pantoprazole  for GI protection  GAD -- resume home meds when   DVT prophylaxis: enoxaparin   Code Status: Full   Family Communication:   Disposition Plan:   Consults called:   Admission status: INP Time  spent : 63 mins   Level of  care: Med-Surg Afton Louder MD [facility] How to contact the [facility] Attending or Consulting provider 7A - 7P or covering provider during after hours 7P -7A, for this patient?  Check the care team in [facility] and look for a) attending/consulting [facility] provider listed and b) the [facility] team listed Log into [email or web address] and use [place]'s universal password to access. If you do not have the password, please contact the hospital operator. Locate the [facility] provider you are looking for under [facility] and page to a number that you can be directly reached. If you still have difficulty reaching the provider, please page the [facility] (Director on Call) for the Hospitalists listed on amion for assistance.   If 7PM-7AM, please contact night-coverage [email or web address] Password [email or web address]  04/10/2024, 3:21 PM

## 2024-04-10 NOTE — ED Provider Notes (Signed)
 [place] EMERGENCY DEPARTMENT AT [facility] Provider Note   CSN: [id number] Arrival date & time: 04/10/24  1213     Patient presents with: Shortness of Breath   Candace Harris is a 73 y.o. female.  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  She reports she has been feeling short of breath and had a productive cough for the past 3 weeks, also complaining of left ear pain and drainage.  She reports subjective fevers at home.  Symptoms are worse this morning so she called EMS.  They arrived and patient on her 3 L was saturating 87 percent per EMS.  They increased her oxygen  to 4 L, gave her an albuterol  treatment and 125 mg of Solu-Medrol .  Oxygen  improved to 97 to 98% on room air after that.  She states she is starting to feel better.      Shortness of Breath      Prior to Admission medications   Medication Sig Start Date End Date Taking? Authorizing Provider  acetaminophen  (TYLENOL ) 650 MG CR tablet Take 650 mg by mouth every 8 (eight) hours as needed for pain. BAYER ARTHRITIS   Yes [provider]  albuterol  (VENTOLIN  HFA) 108 (90 Base) MCG/ACT inhaler Inhale 2 puffs into the lungs every 4 (four) hours as needed for wheezing or shortness of breath. for wheezing 08/30/22  Yes Darlean Ozell NOVAK, MD  amLODipine  (NORVASC ) 5 MG tablet Take 5 mg by mouth daily. 12/19/16  Yes [provider]  baclofen (LIORESAL) 10 MG tablet Take 10 mg by mouth 2 (two) times daily. 01/27/23  Yes [provider]  budesonide -formoterol  (SYMBICORT ) 160-4.5 MCG/ACT inhaler INHALE 2 PUFFS INTO THE LUNGS IN THE MORNING AND AT BEDTIME 05/06/23  Yes Darlean Ozell NOVAK, MD  busPIRone (BUSPAR) 5 MG tablet Take 5 mg by mouth. TAKE ONE TABLET BY MOUTH TWICE DAILY @ 9AM & 5PM FOR 90 DAYS 04/21/23  Yes [provider]  clonazePAM (KLONOPIN) 0.5 MG tablet Take 0.5 mg by mouth 2 (two) times daily.   Yes [provider]  dicyclomine (BENTYL) 20 MG  tablet Take 20 mg by mouth 4 (four) times daily. 06/17/20  Yes [provider]  famotidine  (PEPCID ) 20 MG tablet TAKE ONE TABLET BY MOUTH DAILY AFTER SUPPER 03/16/24  Yes Wert, Michael B, MD  ipratropium (ATROVENT) 0.02 % nebulizer solution Inhale 0.5 mg into the lungs every 4 (four) hours as needed for wheezing. 08/20/23 08/19/24 Yes [provider]  latanoprost (XALATAN) 0.005 % ophthalmic solution Place 1 drop into both eyes at bedtime. 03/31/23  Yes [provider]  levalbuterol (XOPENEX) 1.25 MG/3ML nebulizer solution Inhale 1.25 mg into the lungs. 08/20/23 08/19/24 Yes [provider]  losartan (COZAAR) 50 MG tablet Take 50 mg by mouth every morning. 02/21/23  Yes [provider]  metFORMIN (GLUCOPHAGE) 500 MG tablet Take 500 mg by mouth 2 (two) times daily with a meal. 08/13/16  Yes [provider]  naloxone (NARCAN) nasal spray 4 mg/0.1 mL Place 0.4 mg into the nose once.   Yes [provider]  nitroGLYCERIN (NITROSTAT) 0.4 MG SL tablet Place 0.4 mg under the tongue every 5 (five) minutes as needed for chest pain. 06/17/20  Yes [provider]  omeprazole  (PRILOSEC) 40 MG capsule Take 1 capsule (40 mg total) by mouth 2 (two) times daily before a meal. TAKE 1 CAPSULE 30 TO 60 MINUTES BEFORE FIRST MEAL OF THE DAY 06/23/23  Yes  Mahon, Courtney L, NP  ondansetron (ZOFRAN) 4 MG tablet Take 4 mg by mouth daily. 11/11/19  Yes [provider]  oxyCODONE-acetaminophen  (PERCOCET) 7.5-325 MG tablet Take 1 tablet by mouth 4 (four) times daily as needed for moderate pain (pain score 4-6) or severe pain (pain score 7-10). 03/20/24  Yes [provider]  rosuvastatin  (CRESTOR ) 20 MG tablet Take 20 mg by mouth at bedtime. 06/12/20  Yes [provider]  sertraline (ZOLOFT) 100 MG tablet Take 100 mg by mouth every morning. 02/21/23  Yes [provider]  megestrol (MEGACE) 20 MG tablet Take 20 mg by mouth daily. Patient not  taking: Reported on 04/10/2024    [provider]    Allergies: Alendronate, Ibuprofen, Ramipril, Sulfabenzamide, Duloxetine, Lumateperone, and Varenicline    Review of Systems  Respiratory:  Positive for shortness of breath.     Updated Vital Signs BP 103/79   Pulse 88   Temp 99 F (37.2 C) (Oral)   Resp 13   Ht 5' 1 (1.549 m)   Wt 43.6 kg   SpO2 94%   BMI 18.16 kg/m   Physical Exam Vitals and nursing note reviewed.  Constitutional:      General: She is not in acute distress.    Appearance: She is well-developed.  HENT:     Head: Normocephalic and atraumatic.     Right Ear: No drainage, swelling or tenderness. No foreign body. Tympanic membrane is erythematous.     Left Ear: Drainage present. No swelling or tenderness. No foreign body. Tympanic membrane is erythematous.  Eyes:     Conjunctiva/sclera: Conjunctivae normal.  Cardiovascular:     Rate and Rhythm: Normal rate and regular rhythm.     Heart sounds: No murmur heard. Pulmonary:     Effort: Pulmonary effort is normal. No respiratory distress.     Breath sounds: Examination of the right-upper field reveals wheezing. Examination of the left-upper field reveals wheezing. Examination of the right-middle field reveals wheezing. Examination of the left-middle field reveals wheezing. Examination of the right-lower field reveals wheezing. Examination of the left-lower field reveals wheezing. Wheezing present.  Abdominal:     Palpations: Abdomen is soft.     Tenderness: There is no abdominal tenderness.  Musculoskeletal:        General: No swelling.     Cervical back: Neck supple.  Skin:    General: Skin is warm and dry.     Capillary Refill: Capillary refill takes less than 2 seconds.  Neurological:     General: No focal deficit present.     Mental Status: She is alert and oriented to person, place, and time.  Psychiatric:        Mood and Affect: Mood normal.     (all labs ordered are listed, but only  abnormal results are displayed) Labs Reviewed  BASIC METABOLIC PANEL WITH GFR - Abnormal; Notable for the following components:      Result Value   Sodium 134 (*)    Potassium 2.6 (*)    Chloride 88 (*)    Creatinine, Ser 1.22 (*)    GFR, Estimated 47 (*)    Anion gap 19 (*)    All other components within normal limits  CBC - Abnormal; Notable for the following components:   WBC 19.9 (*)    Hemoglobin 10.4 (*)    HCT 33.7 (*)    RDW 16.1 (*)    All other components within normal limits  DIFFERENTIAL - Abnormal; Notable  for the following components:   Neutro Abs 17.1 (*)    Monocytes Absolute 1.5 (*)    Abs Immature Granulocytes 0.10 (*)    All other components within normal limits  CULTURE, BLOOD (ROUTINE X 2)  CULTURE, BLOOD (ROUTINE X 2)  RESP PANEL BY RT-PCR (RSV, FLU A&B, COVID)  RVPGX2  MAGNESIUM   PROCALCITONIN  HEMOGLOBIN A1C  CBC  CREATININE, SERUM  TROPONIN I (HIGH SENSITIVITY)    EKG: EKG Interpretation Date/Time:  Saturday April 10 2024 12:35:59 EDT Ventricular Rate:  91 PR Interval:  123 QRS Duration:  84 QT Interval:  435 QTC Calculation: 536 R Axis:   78  Text Interpretation: Sinus rhythm Borderline repolarization abnormality Prolonged QT interval Confirmed by Franklyn Gills [phone]) on 04/10/2024 12:39:26 PM  Radiology: [facility] Chest Port 1 View Result Date: 04/10/2024 CLINICAL DATA:  Wheezing, difficulty breathing EXAM: PORTABLE CHEST 1 VIEW COMPARISON:  08/16/2023, 08/13/2023 FINDINGS: Single frontal view of the chest demonstrates a stable cardiac silhouette. Stable prominence of the central pulmonary vasculature. No acute airspace disease, effusion, or pneumothorax. No acute bony abnormalities. IMPRESSION: 1. Chronic prominence of the central pulmonary vascular consistent with pulmonary arterial hypertension. 2. No acute airspace disease. Electronically Signed   By: Ozell Daring M.D.   On: 04/10/2024 12:54     Procedures   Medications Ordered in the  ED  potassium chloride  10 mEq in 100 mL IVPB (10 mEq Intravenous New Bag/Given 04/10/24 1325)  lactated ringers  1,000 mL with potassium chloride  20 mEq infusion (has no administration in time range)  azithromycin  (ZITHROMAX ) tablet 250 mg (has no administration in time range)  cefTRIAXone (ROCEPHIN) 1 g in sodium chloride  0.9 % 100 mL IVPB (has no administration in time range)  insulin  aspart (novoLOG ) injection 0-6 Units (has no administration in time range)  enoxaparin  (LOVENOX ) injection 30 mg (has no administration in time range)  acetaminophen  (TYLENOL ) tablet 650 mg (has no administration in time range)    Or  acetaminophen  (TYLENOL ) suppository 650 mg (has no administration in time range)  oxyCODONE (Oxy IR/ROXICODONE) immediate release tablet 5 mg (has no administration in time range)  fentaNYL (SUBLIMAZE) injection 12.5 mcg (has no administration in time range)  bisacodyl (DULCOLAX) EC tablet 5 mg (has no administration in time range)  ondansetron (ZOFRAN) tablet 4 mg (has no administration in time range)    Or  ondansetron (ZOFRAN) injection 4 mg (has no administration in time range)  guaiFENesin  (MUCINEX ) 12 hr tablet 600 mg (has no administration in time range)  nicotine (NICODERM CQ - dosed in mg/24 hours) patch 14 mg (has no administration in time range)  methylPREDNISolone  sodium succinate (SOLU-MEDROL ) 40 mg/mL injection 40 mg (has no administration in time range)  ipratropium-albuterol  (DUONEB) 0.5-2.5 (3) MG/3ML nebulizer solution 3 mL (has no administration in time range)  albuterol  (PROVENTIL ) (2.5 MG/3ML) 0.083% nebulizer solution 2.5 mg (has no administration in time range)  sodium chloride  flush (NS) 0.9 % injection 3 mL (3 mLs Intravenous Given 04/10/24 1231)  albuterol  (PROVENTIL ) (2.5 MG/3ML) 0.083% nebulizer solution 2.5 mg (2.5 mg Nebulization Given 04/10/24 1220)  cefTRIAXone (ROCEPHIN) 1 g in sodium chloride  0.9 % 100 mL IVPB (0 g Intravenous Stopped 04/10/24 1428)   azithromycin  (ZITHROMAX ) tablet 500 mg (500 mg Oral Given 04/10/24 1317)  potassium chloride  SA (KLOR-CON  M) CR tablet 40 mEq (40 mEq Oral Given 04/10/24 1317)  magnesium  sulfate IVPB 1 g 100 mL (1 g Intravenous New Bag/Given 04/10/24 1317)  Medical Decision Making Differential diagnosis includes but not limited to pneumonia, sepsis, COPD exacerbation, PE, dehydration, electrolyte derangement, other  Labs/troponin negative x 2, magnesium  normal, BMP with creatinine 1.22 which is increased from baseline around 0.5, normal BUN, potassium 2.6, anion gap 19 with normal CO2 CBC with white blood cell count 19.9, hemoglobin 10.4  Imaging: I ordered and interpreted patient's chest x-ray which shows prominent prominence of central pulmonary vascular with no acute airspace disease, I agree with radiology read  ED course: Patient presents ER today for evaluation of increased shortness of breath, decreased p.o. intake.  Noted to be wheezing on arrival which is improved after nebulizer treatment.  She got a neb treatment and steroids and route with EMS.  Wheezing has resolved, satting 90 to 94% on her baseline home oxygen  at this time labs with leukocytosis though no infiltrate on her chest x-ray.  She does have purulent sputum so was treated empirically for community-acquired pneumonia.  BMP with significantly low potassium, patient given repletion oral and IV and given IV fluids due to her dehydration with AKI.  Discussed with hospitalist for admission.  Patient's EKG reviewed and interpreted, sinus rhythm with QTc 536, ST depression and T wave abnormalities in inferior lateral leads, likely due to electrolyte derangement, troponin negative and patient without chest pain.  Consults: I consulted hospitalist and spoke with Dr. Vicci who will admit the patient    Amount and/or Complexity of Data Reviewed Labs: ordered. Radiology: ordered.  Risk Prescription drug  management. Decision regarding hospitalization.        Final diagnoses:  COPD exacerbation (HCC)  Acute on chronic hypoxic respiratory failure (HCC)  AKI (acute kidney injury)  Hypokalemia    ED Discharge Orders     None          Suellen Sherran LABOR, PA-C 04/10/24 1440    Franklyn Sid SAILOR, MD 04/12/24 [phone]

## 2024-04-10 NOTE — ED Triage Notes (Signed)
 Pt arrived via [facility]. EMS called out to pt's house for difficulty breathing. Pt given one 2.5 of albuteral and 125 of solumedrol IV. Pt has a 22 GA IV in L AC. PT came up to 98% after breathing treatment.  Pt placed on 4 Lpm O2. Pt on 3 Lpm O2 baseline at home for COPD. Pt has bilat wheezing and a productive cough upon assessment.

## 2024-04-10 NOTE — Hospital Course (Signed)
 73 year old female with chronic hypoxic respiratory failure chronically on 3 L oxygen , tobacco abuse, GAD, history of PTSD, type 2 diabetes mellitus, fibromyalgia and depression, hypertension, COPD, GERD presented to the ED by [facility] for difficulty breathing.  Patient reports that she has had a difficult time breathing more than normal over the past several days and reports she has had malaise and feeling unwell for the past week with poor appetite and significant coughing and wheezing.  Her cough has been productive of a thick greenish sputum.  EMS found her at home on 4 L nasal cannula but oxygenating at 87%.  They found her to be dyspneic and having physical exam findings of severe diffuse wheezing.  She was given a dose of IV Solu-Medrol  and route and albuterol  nebulizer treatment.  She presented with a leukocytosis, she was afebrile, her labs revealed a mild AKI and hypokalemia with a potassium of 2.6.  Chest x-ray with no acute findings reported.  ED provider reported purulent sputum production and started her on broad-spectrum antibiotic coverage for CAP.  Admission was requested for treatment of acute COPD exacerbation with acute on chronic respiratory failure.

## 2024-04-10 NOTE — Plan of Care (Signed)

## 2024-04-11 DIAGNOSIS — I10 Essential (primary) hypertension: Secondary | ICD-10-CM

## 2024-04-11 DIAGNOSIS — E860 Dehydration: Secondary | ICD-10-CM

## 2024-04-11 DIAGNOSIS — J9621 Acute and chronic respiratory failure with hypoxia: Secondary | ICD-10-CM | POA: Diagnosis not present

## 2024-04-11 DIAGNOSIS — K219 Gastro-esophageal reflux disease without esophagitis: Secondary | ICD-10-CM | POA: Diagnosis not present

## 2024-04-11 LAB — BASIC METABOLIC PANEL WITH GFR
Anion gap: 10 (ref 5–15)
BUN: 14 mg/dL (ref 8–23)
CO2: 26 mmol/L (ref 22–32)
Calcium: 8.9 mg/dL (ref 8.9–10.3)
Chloride: 100 mmol/L (ref 98–111)
Creatinine, Ser: 1.02 mg/dL — ABNORMAL HIGH (ref 0.44–1.00)
GFR, Estimated: 58 mL/min — ABNORMAL LOW (ref >60)
Glucose, Bld: 129 mg/dL — ABNORMAL HIGH (ref 70–99)
Potassium: 3.5 mmol/L (ref 3.5–5.1)
Sodium: 136 mmol/L (ref 135–145)

## 2024-04-11 LAB — CBC WITH DIFFERENTIAL/PLATELET
Abs Immature Granulocytes: 0.1 K/uL — ABNORMAL HIGH (ref 0.00–0.07)
Basophils Absolute: 0 K/uL (ref 0.0–0.1)
Basophils Relative: 0 %
Eosinophils Absolute: 0 K/uL (ref 0.0–0.5)
Eosinophils Relative: 0 %
HCT: 29.5 % — ABNORMAL LOW (ref 36.0–46.0)
Hemoglobin: 9.3 g/dL — ABNORMAL LOW (ref 12.0–15.0)
Immature Granulocytes: 1 %
Lymphocytes Relative: 3 %
Lymphs Abs: 0.6 K/uL — ABNORMAL LOW (ref 0.7–4.0)
MCH: 25.8 pg — ABNORMAL LOW (ref 26.0–34.0)
MCHC: 31.5 g/dL (ref 30.0–36.0)
MCV: 81.7 fL (ref 80.0–100.0)
Monocytes Absolute: 0.5 K/uL (ref 0.1–1.0)
Monocytes Relative: 3 %
Neutro Abs: 17.1 K/uL — ABNORMAL HIGH (ref 1.7–7.7)
Neutrophils Relative %: 93 %
Platelets: 346 K/uL (ref 150–400)
RBC: 3.61 MIL/uL — ABNORMAL LOW (ref 3.87–5.11)
RDW: 15.9 % — ABNORMAL HIGH (ref 11.5–15.5)
WBC: 18.3 K/uL — ABNORMAL HIGH (ref 4.0–10.5)
nRBC: 0 % (ref 0.0–0.2)

## 2024-04-11 LAB — GLUCOSE, CAPILLARY
Glucose-Capillary: 134 mg/dL — ABNORMAL HIGH (ref 70–99)
Glucose-Capillary: 132 mg/dL — ABNORMAL HIGH (ref 70–99)
Glucose-Capillary: 147 mg/dL — ABNORMAL HIGH (ref 70–99)
Glucose-Capillary: 126 mg/dL — ABNORMAL HIGH (ref 70–99)
Glucose-Capillary: 130 mg/dL — ABNORMAL HIGH (ref 70–99)

## 2024-04-11 LAB — PROCALCITONIN: Procalcitonin: 0.22 ng/mL

## 2024-04-11 LAB — MAGNESIUM: Magnesium: 2.4 mg/dL (ref 1.7–2.4)

## 2024-04-11 NOTE — Progress Notes (Signed)
 PROGRESS NOTE   Candace Harris  FMW:[id number] DOB: 1950/08/19 DOA: 04/10/2024 [facility]CP: [facility]   Chief Complaint  Patient presents with   Shortness of Breath   Level of care: Med-Surg  Brief Admission History:  73 year old female with chronic hypoxic respiratory failure chronically on 3 L oxygen , tobacco abuse, GAD, history of PTSD, type 2 diabetes mellitus, fibromyalgia and depression, hypertension, COPD, GERD presented to the ED by [facility] for difficulty breathing.  Patient reports that she has had a difficult time breathing more than normal over the past several days and reports she has had malaise and feeling unwell for the past week with poor appetite and significant coughing and wheezing.  Her cough has been productive of a thick greenish sputum.  EMS found her at home on 4 L nasal cannula but oxygenating at 87%.  They found her to be dyspneic and having physical exam findings of severe diffuse wheezing.  She was given a dose of IV Solu-Medrol  and route and albuterol  nebulizer treatment.  She presented with a leukocytosis, she was afebrile, her labs revealed a mild AKI and hypokalemia with a potassium of 2.6.  Chest x-ray with no acute findings reported.  ED provider reported purulent sputum production and started her on broad-spectrum antibiotic coverage for CAP.  Admission was requested for treatment of acute COPD exacerbation with acute on chronic respiratory failure.   Assessment and Plan:  Acute on chronic respiratory failure with hypoxia -- Secondary to acute COPD exacerbation -- Continue supplemental oxygen  support -- Continue aggressive COPD exacerbation treatments -- she remains dyspneic, continue treatments    Acute COPD exacerbation -- Continue IV steroids as ordered -- Continue antibiotics ordered: ceftriaxone and azithromycin  -- schedule bronchodilators, RT rounding and adjustment  -- added flutter valve    Leukocytosis  -- pt received IV solumedrol 125 mg en  route to ED by EMS -- anticipating steroid induced leukemoid reaction -- continue antibiotics as ordered -- follow blood cultures--no growth to date  -- procalcitonin down to 0.22   Dehydration - improving  AKI -- improving -- pt reports very poor oral intake over last month  -- IV fluid hydration    Hypokalemia - severe -- IV replacement ordered -- IV magnesium  ordered -- follow BMP    GERD -- pantoprazole  for GI protection   GAD -- resume home meds when   DVT prophylaxis: enoxaparin  Code Status: Full  Family Communication:  Disposition:    Consultants:   Procedures:   Antimicrobials:    Subjective: Pt says she remains very SOB, denies CP and palpitations   Objective: Vitals:   04/11/24 0431 04/11/24 0700 04/11/24 0758 04/11/24 0800  BP: (!) 87/63 96/83    Pulse: 80     Resp: 17     Temp: 97.6 F (36.4 C)     TempSrc: Oral     SpO2: 95%  97% 97%  Weight:      Height:        Intake/Output Summary (Last 24 hours) at 04/11/2024 1340 Last data filed at 04/11/2024 0620 Gross per 24 hour  Intake 948.98 ml  Output 250 ml  Net 698.98 ml   Filed Weights   04/10/24 1226  Weight: 43.6 kg   Examination:  General exam: Appears calm and comfortable  Respiratory system: diffuse expiratory wheezing and rales.  Cardiovascular system: normal S1 & S2 heard. No JVD, murmurs, rubs, gallops or clicks. No pedal edema. Gastrointestinal system: Abdomen is nondistended, soft and nontender. No organomegaly or masses  felt. Normal bowel sounds heard. Central nervous system: Alert and oriented. No focal neurological deficits. Extremities: Symmetric 5 x 5 power. Skin: No rashes, lesions or ulcers. Psychiatry: Judgement and insight appear normal. Mood & affect appropriate.   Data Reviewed: I have personally reviewed following labs and imaging studies  CBC: Recent Labs  Lab 04/10/24 1228 04/10/24 1312 04/10/24 1448 04/11/24 0426  WBC 19.9*  --  22.3* 18.3*  NEUTROABS   --  17.1*  --  17.1*  HGB 10.4*  --  10.0* 9.3*  HCT 33.7*  --  32.3* 29.5*  MCV 84.9  --  84.3 81.7  PLT 345  --  375 346    Basic Metabolic Panel: Recent Labs  Lab 04/10/24 1228 04/10/24 1312 04/10/24 1448 04/11/24 0426  NA 134*  --   --  136  K 2.6*  --   --  3.5  CL 88*  --   --  100  CO2 27  --   --  26  GLUCOSE 95  --   --  129*  BUN 15  --   --  14  CREATININE 1.22*  --  1.15* 1.02*  CALCIUM  9.0  --   --  8.9  MG  --  1.9  --  2.4    CBG: Recent Labs  Lab 04/10/24 1731 04/10/24 2010 04/11/24 0313 04/11/24 0736 04/11/24 1149  GLUCAP 142* 188* 134* 132* 147*    Recent Results (from the past 240 hours)  Blood culture (routine x 2)     Status: None (Preliminary result)   Collection Time: 04/10/24  1:12 PM   Specimen: BLOOD RIGHT FOREARM  Result Value Ref Range Status   Specimen Description   Final    BLOOD RIGHT FOREARM BOTTLES DRAWN AEROBIC AND ANAEROBIC   Special Requests Blood Culture adequate volume  Final   Culture   Final    NO GROWTH < 24 HOURS Performed at [facility], [address], [place], [place] [address]    Report Status PENDING  Incomplete  Blood culture (routine x 2)     Status: None (Preliminary result)   Collection Time: 04/10/24  1:12 PM   Specimen: Left Antecubital; Blood  Result Value Ref Range Status   Specimen Description   Final    LEFT ANTECUBITAL BOTTLES DRAWN AEROBIC AND ANAEROBIC   Special Requests Blood Culture adequate volume  Final   Culture   Final    NO GROWTH < 24 HOURS Performed at [facility], [address], [place], [place] [address]    Report Status PENDING  Incomplete  Resp panel by RT-PCR (RSV, Flu A&B, Covid) Anterior Nasal Swab     Status: None   Collection Time: 04/10/24  2:26 PM   Specimen: Anterior Nasal Swab  Result Value Ref Range Status   SARS Coronavirus 2 by RT PCR NEGATIVE NEGATIVE Final    Comment: (NOTE) SARS-CoV-2 target nucleic acids are NOT DETECTED.  The SARS-CoV-2 RNA is generally  detectable in upper respiratory specimens during the acute phase of infection. The lowest concentration of SARS-CoV-2 viral copies this assay can detect is 138 copies/mL. A negative result does not preclude SARS-Cov-2 infection and should not be used as the sole basis for treatment or other patient management decisions. A negative result may occur with  improper specimen collection/handling, submission of specimen other than nasopharyngeal swab, presence of viral mutation(s) within the areas targeted by this assay, and inadequate number of viral copies(<138 copies/mL). A negative result  must be combined with clinical observations, patient history, and epidemiological information. The expected result is Negative.  Fact Sheet for Patients:  BloggerCourse.com  Fact Sheet for Healthcare Providers:  SeriousBroker.it  This test is no t yet approved or cleared by the [place]  FDA and  has been authorized for detection and/or diagnosis of SARS-CoV-2 by FDA under an Emergency Use Authorization (EUA). This EUA will remain  in effect (meaning this test can be used) for the duration of the COVID-19 declaration under Section 564(b)(1) of the Act, 21 U.S.C.section 360bbb-3(b)(1), unless the authorization is terminated  or revoked sooner.       Influenza A by PCR NEGATIVE NEGATIVE Final   Influenza B by PCR NEGATIVE NEGATIVE Final    Comment: (NOTE) The Xpert Xpress SARS-CoV-2/FLU/RSV plus assay is intended as an aid in the diagnosis of influenza from Nasopharyngeal swab specimens and should not be used as a sole basis for treatment. Nasal washings and aspirates are unacceptable for Xpert Xpress SARS-CoV-2/FLU/RSV testing.  Fact Sheet for Patients: BloggerCourse.com  Fact Sheet for Healthcare Providers: SeriousBroker.it  This test is not yet approved or cleared by the [place]  FDA  and has been authorized for detection and/or diagnosis of SARS-CoV-2 by FDA under an Emergency Use Authorization (EUA). This EUA will remain in effect (meaning this test can be used) for the duration of the COVID-19 declaration under Section 564(b)(1) of the Act, 21 U.S.C. section 360bbb-3(b)(1), unless the authorization is terminated or revoked.     Resp Syncytial Virus by PCR NEGATIVE NEGATIVE Final    Comment: (NOTE) Fact Sheet for Patients: BloggerCourse.com  Fact Sheet for Healthcare Providers: SeriousBroker.it  This test is not yet approved or cleared by the [place]  FDA and has been authorized for detection and/or diagnosis of SARS-CoV-2 by FDA under an Emergency Use Authorization (EUA). This EUA will remain in effect (meaning this test can be used) for the duration of the COVID-19 declaration under Section 564(b)(1) of the Act, 21 U.S.C. section 360bbb-3(b)(1), unless the authorization is terminated or revoked.  Performed at [facility], [address] [place], [place] [address]      Radiology Studies: [facility] 1 View Result Date: 04/10/2024 CLINICAL DATA:  Wheezing, difficulty breathing EXAM: PORTABLE CHEST 1 VIEW COMPARISON:  08/16/2023, 08/13/2023 FINDINGS: Single frontal view of the chest demonstrates a stable cardiac silhouette. Stable prominence of the central pulmonary vasculature. No acute airspace disease, effusion, or pneumothorax. No acute bony abnormalities. IMPRESSION: 1. Chronic prominence of the central pulmonary vascular consistent with pulmonary arterial hypertension. 2. No acute airspace disease. Electronically Signed   By: Ozell Daring M.D.   On: 04/10/2024 12:54    Scheduled Meds:  amLODipine   5 mg Oral Daily   azithromycin   250 mg Oral Daily   busPIRone  5 mg Oral BID   clonazePAM  0.5 mg Oral BID   enoxaparin  (LOVENOX ) injection  30 mg Subcutaneous Q24H   fluticasone  furoate-vilanterol   1 puff Inhalation Daily   Influenza vac split trivalent PF  0.5 mL Intramuscular Tomorrow-1000   insulin  aspart  0-6 Units Subcutaneous TID WC   ipratropium-albuterol   3 mL Nebulization Q6H   latanoprost  1 drop Both Eyes QHS   methylPREDNISolone  (SOLU-MEDROL ) injection  40 mg Intravenous Q12H   nicotine  14 mg Transdermal Daily   pantoprazole   40 mg Oral QPM   rosuvastatin   20 mg Oral QHS   sertraline  100 mg Oral q morning   Continuous Infusions:  cefTRIAXone (  ROCEPHIN)  IV     lactated ringers  50 mL/hr at 04/10/24 1806    LOS: 1 day   Time spent: 56 mins  Chinonso Linker Vicci, MD How to contact the [facility] Attending or Consulting provider 7A - 7P or covering provider during after hours 7P -7A, for this patient?  Check the care team in [facility] and look for a) attending/consulting TRH provider listed and b) the TRH team listed Log into [email or web address] to find provider on call.  Locate the TRH provider you are looking for under [facility] and page to a number that you can be directly reached. If you still have difficulty reaching the provider, please page the [facility] (Director on Call) for the Hospitalists listed on amion for assistance.  04/11/2024, 1:40 PM

## 2024-04-11 NOTE — Plan of Care (Signed)
   Problem: Education: Goal: Knowledge of General Education information will improve Description Including pain rating scale, medication(s)/side effects and non-pharmacologic comfort measures Outcome: Progressing   Problem: Health Behavior/Discharge Planning: Goal: Ability to manage health-related needs will improve Outcome: Progressing

## 2024-04-11 NOTE — Progress Notes (Signed)
   04/11/24 0821  TOC Brief Assessment  Insurance and Status Reviewed (No insurance listed.)  Patient has primary care physician Yes ([facility])  Home environment has been reviewed From home, son primary contact.  Prior level of function: Need assistnace, home 02.  Prior/Current Home Services Current home services (O2)  Social Drivers of Health Review SDOH reviewed needs interventions (Watch for 02 needs/modifications.)  Readmission risk has been reviewed Yes  Transition of care needs transition of care needs identified, TOC will continue to follow   ICM will follow for DC needs.

## 2024-04-11 NOTE — Progress Notes (Signed)
 Went to hang patients antibiotic noted that IV tubing was in the floor with catheter attached, the IV may have slipped out of AC, will obtain another IV site and then resume antibiotic

## 2024-04-12 ENCOUNTER — Encounter (HOSPITAL_COMMUNITY): Payer: Self-pay | Admitting: Family Medicine

## 2024-04-12 DIAGNOSIS — K219 Gastro-esophageal reflux disease without esophagitis: Secondary | ICD-10-CM | POA: Diagnosis not present

## 2024-04-12 DIAGNOSIS — J9621 Acute and chronic respiratory failure with hypoxia: Secondary | ICD-10-CM | POA: Diagnosis not present

## 2024-04-12 DIAGNOSIS — E860 Dehydration: Secondary | ICD-10-CM | POA: Diagnosis not present

## 2024-04-12 DIAGNOSIS — I10 Essential (primary) hypertension: Secondary | ICD-10-CM | POA: Diagnosis not present

## 2024-04-12 LAB — CBC WITH DIFFERENTIAL/PLATELET
Abs Immature Granulocytes: 0.09 K/uL — ABNORMAL HIGH (ref 0.00–0.07)
Basophils Absolute: 0 K/uL (ref 0.0–0.1)
Basophils Relative: 0 %
Eosinophils Absolute: 0 K/uL (ref 0.0–0.5)
Eosinophils Relative: 0 %
HCT: 29.5 % — ABNORMAL LOW (ref 36.0–46.0)
Hemoglobin: 9.3 g/dL — ABNORMAL LOW (ref 12.0–15.0)
Immature Granulocytes: 1 %
Lymphocytes Relative: 6 %
Lymphs Abs: 1 K/uL (ref 0.7–4.0)
MCH: 26.1 pg (ref 26.0–34.0)
MCHC: 31.5 g/dL (ref 30.0–36.0)
MCV: 82.6 fL (ref 80.0–100.0)
Monocytes Absolute: 1.1 K/uL — ABNORMAL HIGH (ref 0.1–1.0)
Monocytes Relative: 7 %
Neutro Abs: 13.4 K/uL — ABNORMAL HIGH (ref 1.7–7.7)
Neutrophils Relative %: 86 %
Platelets: 369 K/uL (ref 150–400)
RBC: 3.57 MIL/uL — ABNORMAL LOW (ref 3.87–5.11)
RDW: 16 % — ABNORMAL HIGH (ref 11.5–15.5)
WBC: 15.5 K/uL — ABNORMAL HIGH (ref 4.0–10.5)
nRBC: 0 % (ref 0.0–0.2)

## 2024-04-12 LAB — GLUCOSE, CAPILLARY
Glucose-Capillary: 98 mg/dL (ref 70–99)
Glucose-Capillary: 115 mg/dL — ABNORMAL HIGH (ref 70–99)
Glucose-Capillary: 155 mg/dL — ABNORMAL HIGH (ref 70–99)
Glucose-Capillary: 187 mg/dL — ABNORMAL HIGH (ref 70–99)
Glucose-Capillary: 184 mg/dL — ABNORMAL HIGH (ref 70–99)

## 2024-04-12 LAB — BASIC METABOLIC PANEL WITH GFR
Anion gap: 11 (ref 5–15)
BUN: 12 mg/dL (ref 8–23)
CO2: 30 mmol/L (ref 22–32)
Calcium: 9 mg/dL (ref 8.9–10.3)
Chloride: 102 mmol/L (ref 98–111)
Creatinine, Ser: 0.97 mg/dL (ref 0.44–1.00)
GFR, Estimated: >60 mL/min (ref >60)
Glucose, Bld: 99 mg/dL (ref 70–99)
Potassium: 3 mmol/L — ABNORMAL LOW (ref 3.5–5.1)
Sodium: 143 mmol/L (ref 135–145)

## 2024-04-12 LAB — MAGNESIUM: Magnesium: 2 mg/dL (ref 1.7–2.4)

## 2024-04-12 MED ORDER — IPRATROPIUM-ALBUTEROL 0.5-2.5 (3) MG/3ML IN SOLN
3.0000 mL | Freq: Three times a day (TID) | RESPIRATORY_TRACT | Status: DC
  Administered 2024-04-12 – 2024-04-13 (×5): 3 mL via RESPIRATORY_TRACT
  Filled 2024-04-12 (×5): qty 3

## 2024-04-12 MED ORDER — POTASSIUM CHLORIDE 10 MEQ/100ML IV SOLN
10.0000 meq | INTRAVENOUS | Status: AC
  Administered 2024-04-12 (×4): 10 meq via INTRAVENOUS
  Filled 2024-04-12 (×4): qty 100

## 2024-04-12 NOTE — Progress Notes (Signed)
 PROGRESS NOTE   Candace Harris  FMW:[id number] DOB: 04-12-1951 DOA: 04/10/2024 PCP: [facility]   Chief Complaint  Patient presents with   Shortness of Breath   Level of care: Med-Surg  Brief Admission History:  73 year old female with chronic hypoxic respiratory failure chronically on 3 L oxygen , tobacco abuse, GAD, history of PTSD, type 2 diabetes mellitus, fibromyalgia and depression, hypertension, COPD, GERD presented to the ED by [facility] for difficulty breathing.  Patient reports that she has had a difficult time breathing more than normal over the past several days and reports she has had malaise and feeling unwell for the past week with poor appetite and significant coughing and wheezing.  Her cough has been productive of a thick greenish sputum.  EMS found her at home on 4 L nasal cannula but oxygenating at 87%.  They found her to be dyspneic and having physical exam findings of severe diffuse wheezing.  She was given a dose of IV Solu-Medrol  and route and albuterol  nebulizer treatment.  She presented with a leukocytosis, she was afebrile, her labs revealed a mild AKI and hypokalemia with a potassium of 2.6.  Chest x-ray with no acute findings reported.  ED provider reported purulent sputum production and started her on broad-spectrum antibiotic coverage for CAP.  Admission was requested for treatment of acute COPD exacerbation with acute on chronic respiratory failure.   Assessment and Plan:  Acute on chronic respiratory failure with hypoxia -- Secondary to acute COPD exacerbation -- Continue supplemental oxygen  support -- Continue aggressive COPD exacerbation treatments -- she remains dyspneic, continue treatments, she is improving but not back to baseline    Acute COPD exacerbation -- Continue IV steroids as ordered -- Continue antibiotics ordered: ceftriaxone and azithromycin  -- schedule bronchodilators, RT rounding and adjustment  -- added flutter valve -- continue  treatments   Leukocytosis  -- pt received IV solumedrol 125 mg en route to ED by EMS -- anticipating steroid induced leukemoid reaction -- continue antibiotics as ordered -- follow blood cultures--no growth to date  -- procalcitonin down to 0.22   Dehydration - improving  AKI -- improving -- pt reports very poor oral intake over last month  -- IV fluid hydration    Hypokalemia - severe -- IV replacement ordered -- IV magnesium  ordered -- follow BMP    GERD -- pantoprazole  for GI protection   GAD -- resume home meds when   DVT prophylaxis: enoxaparin  Code Status: Full  Family Communication:  Disposition:    Consultants:   Procedures:   Antimicrobials:    Subjective: Pt says she is speaking in longer sentences without SOB but not back to baseline yet, still coughing and SOB.    Objective: Vitals:   04/12/24 0409 04/12/24 0800 04/12/24 0804 04/12/24 0912  BP: 103/65   93/63  Pulse: 87     Resp: 18     Temp: 99.4 F (37.4 C)     TempSrc: Oral     SpO2: 94% 98% 98%   Weight:      Height:        Intake/Output Summary (Last 24 hours) at 04/12/2024 1404 Last data filed at 04/12/2024 0409 Gross per 24 hour  Intake 484 ml  Output 650 ml  Net -166 ml   Filed Weights   04/10/24 1226  Weight: 43.6 kg   Examination:  General exam: Appears calm and comfortable  Respiratory system: better air movement, no increased WOB, no rales heard, bibasilar wheeze heard. Cardiovascular system: normal  S1 & S2 heard. No JVD, murmurs, rubs, gallops or clicks. No pedal edema. Gastrointestinal system: Abdomen is nondistended, soft and nontender. No organomegaly or masses felt. Normal bowel sounds heard. Central nervous system: Alert and oriented. No focal neurological deficits. Extremities: Symmetric 5 x 5 power. Skin: No rashes, lesions or ulcers. Psychiatry: Judgement and insight appear normal. Mood & affect appropriate.   Data Reviewed: I have personally reviewed following  labs and imaging studies  CBC: Recent Labs  Lab 04/10/24 1228 04/10/24 1312 04/10/24 1448 04/11/24 0426 04/12/24 0514  WBC 19.9*  --  22.3* 18.3* 15.5*  NEUTROABS  --  17.1*  --  17.1* 13.4*  HGB 10.4*  --  10.0* 9.3* 9.3*  HCT 33.7*  --  32.3* 29.5* 29.5*  MCV 84.9  --  84.3 81.7 82.6  PLT 345  --  375 346 369    Basic Metabolic Panel: Recent Labs  Lab 04/10/24 1228 04/10/24 1312 04/10/24 1448 04/11/24 0426 04/12/24 0514  NA 134*  --   --  136 143  K 2.6*  --   --  3.5 3.0*  CL 88*  --   --  100 102  CO2 27  --   --  26 30  GLUCOSE 95  --   --  129* 99  BUN 15  --   --  14 12  CREATININE 1.22*  --  1.15* 1.02* 0.97  CALCIUM  9.0  --   --  8.9 9.0  MG  --  1.9  --  2.4 2.0    CBG: Recent Labs  Lab 04/11/24 1616 04/11/24 2001 04/12/24 0306 04/12/24 0729 04/12/24 1158  GLUCAP 126* 130* 98 115* 155*    Recent Results (from the past 240 hours)  Blood culture (routine x 2)     Status: None (Preliminary result)   Collection Time: 04/10/24  1:12 PM   Specimen: BLOOD RIGHT FOREARM  Result Value Ref Range Status   Specimen Description   Final    BLOOD RIGHT FOREARM BOTTLES DRAWN AEROBIC AND ANAEROBIC   Special Requests Blood Culture adequate volume  Final   Culture   Final    NO GROWTH 2 DAYS Performed at [facility], [address] [place], [place] [address]    Report Status PENDING  Incomplete  Blood culture (routine x 2)     Status: None (Preliminary result)   Collection Time: 04/10/24  1:12 PM   Specimen: Left Antecubital; Blood  Result Value Ref Range Status   Specimen Description   Final    LEFT ANTECUBITAL BOTTLES DRAWN AEROBIC AND ANAEROBIC   Special Requests Blood Culture adequate volume  Final   Culture   Final    NO GROWTH 2 DAYS Performed at [facility], [address] [place], [place] [address]    Report Status PENDING  Incomplete  Resp panel by RT-PCR (RSV, Flu A&B, Covid) Anterior Nasal Swab     Status: None   Collection Time: 04/10/24   2:26 PM   Specimen: Anterior Nasal Swab  Result Value Ref Range Status   SARS Coronavirus 2 by RT PCR NEGATIVE NEGATIVE Final    Comment: (NOTE) SARS-CoV-2 target nucleic acids are NOT DETECTED.  The SARS-CoV-2 RNA is generally detectable in upper respiratory specimens during the acute phase of infection. The lowest concentration of SARS-CoV-2 viral copies this assay can detect is 138 copies/mL. A negative result does not preclude SARS-Cov-2 infection and should not be used as the sole basis for treatment  or other patient management decisions. A negative result may occur with  improper specimen collection/handling, submission of specimen other than nasopharyngeal swab, presence of viral mutation(s) within the areas targeted by this assay, and inadequate number of viral copies(<138 copies/mL). A negative result must be combined with clinical observations, patient history, and epidemiological information. The expected result is Negative.  Fact Sheet for Patients:  BloggerCourse.com  Fact Sheet for Healthcare Providers:  SeriousBroker.it  This test is no t yet approved or cleared by the [place]  FDA and  has been authorized for detection and/or diagnosis of SARS-CoV-2 by FDA under an Emergency Use Authorization (EUA). This EUA will remain  in effect (meaning this test can be used) for the duration of the COVID-19 declaration under Section 564(b)(1) of the Act, 21 U.S.C.section 360bbb-3(b)(1), unless the authorization is terminated  or revoked sooner.       Influenza A by PCR NEGATIVE NEGATIVE Final   Influenza B by PCR NEGATIVE NEGATIVE Final    Comment: (NOTE) The Xpert Xpress SARS-CoV-2/FLU/RSV plus assay is intended as an aid in the diagnosis of influenza from Nasopharyngeal swab specimens and should not be used as a sole basis for treatment. Nasal washings and aspirates are unacceptable for Xpert Xpress  SARS-CoV-2/FLU/RSV testing.  Fact Sheet for Patients: BloggerCourse.com  Fact Sheet for Healthcare Providers: SeriousBroker.it  This test is not yet approved or cleared by the [place]  FDA and has been authorized for detection and/or diagnosis of SARS-CoV-2 by FDA under an Emergency Use Authorization (EUA). This EUA will remain in effect (meaning this test can be used) for the duration of the COVID-19 declaration under Section 564(b)(1) of the Act, 21 U.S.C. section 360bbb-3(b)(1), unless the authorization is terminated or revoked.     Resp Syncytial Virus by PCR NEGATIVE NEGATIVE Final    Comment: (NOTE) Fact Sheet for Patients: BloggerCourse.com  Fact Sheet for Healthcare Providers: SeriousBroker.it  This test is not yet approved or cleared by the [place]  FDA and has been authorized for detection and/or diagnosis of SARS-CoV-2 by FDA under an Emergency Use Authorization (EUA). This EUA will remain in effect (meaning this test can be used) for the duration of the COVID-19 declaration under Section 564(b)(1) of the Act, 21 U.S.C. section 360bbb-3(b)(1), unless the authorization is terminated or revoked.  Performed at [facility], [address] [place], [place] [address]    Radiology Studies: No results found.  Scheduled Meds:  azithromycin   250 mg Oral Daily   busPIRone  5 mg Oral BID   clonazePAM  0.5 mg Oral BID   enoxaparin  (LOVENOX ) injection  30 mg Subcutaneous Q24H   fluticasone  furoate-vilanterol  1 puff Inhalation Daily   Influenza vac split trivalent PF  0.5 mL Intramuscular Tomorrow-1000   insulin  aspart  0-6 Units Subcutaneous TID WC   ipratropium-albuterol   3 mL Nebulization TID   latanoprost  1 drop Both Eyes QHS   methylPREDNISolone  (SOLU-MEDROL ) injection  40 mg Intravenous Q12H   nicotine  14 mg Transdermal Daily   pantoprazole   40 mg Oral  QPM   rosuvastatin   20 mg Oral QHS   sertraline  100 mg Oral q morning   Continuous Infusions:  cefTRIAXone (ROCEPHIN)  IV 1 g (04/12/24 1300)    LOS: 2 days   Time spent: 55 mins  Devonna Oboyle Vicci, MD How to contact the [facility] Attending or Consulting provider 7A - 7P or covering provider during after hours 7P -7A, for this patient?  Check the care team in  [facility] and look for a) attending/consulting TRH provider listed and b) the TRH team listed Log into [email or web address] to find provider on call.  Locate the [facility] provider you are looking for under [facility] and page to a number that you can be directly reached. If you still have difficulty reaching the provider, please page the [facility] (Director on Call) for the Hospitalists listed on amion for assistance.  04/12/2024, 2:04 PM

## 2024-04-12 NOTE — TOC Initial Note (Signed)
 Transition of Care [facility]) - Initial/Assessment Note    Patient Details  Name: Candace Harris MRN: [id number] Date of Birth: January 13, 1951  Transition of Care [facility]) CM/SW Contact:    Lucie Lunger, LCSWA Phone Number: 04/12/2024, 1:35 PM  Clinical Narrative:                 CSW notes that pt is high risk for readmission. CSW spoke with pt at bedside to complete assessment. Pt states she lives alone. Pt is able to complete ADLs independently. Pt has transportation provided when needed. Pt had not had HH in the past. Pt has a walker to use when needed in the home. TOC to follow.   Expected Discharge Plan: Home/Self Care Barriers to Discharge: Continued Medical Work up   Patient Goals and CMS Choice Patient states their goals for this hospitalization and ongoing recovery are:: return home CMS Medicare.gov Compare Post Acute Care list provided to:: Patient Choice offered to / list presented to : Patient      Expected Discharge Plan and Services In-house Referral: Clinical Social Work Discharge Planning Services: CM Consult   Living arrangements for the past 2 months: Single Family Home                                      Prior Living Arrangements/Services Living arrangements for the past 2 months: Single Family Home Lives with:: Self Patient language and need for interpreter reviewed:: Yes Do you feel safe going back to the place where you live?: Yes      Need for Family Participation in Patient Care: Yes (Comment) Care giver support system in place?: Yes (comment) Current home services: DME Criminal Activity/Legal Involvement Pertinent to Current Situation/Hospitalization: No - Comment as needed  Activities of Daily Living   ADL Screening (condition at time of admission) Independently performs ADLs?: No Does the patient have a NEW difficulty with bathing/dressing/toileting/self-feeding that is expected to last >3 days?: No Does the patient have a NEW difficulty with  getting in/out of bed, walking, or climbing stairs that is expected to last >3 days?: Yes (Initiates electronic notice to provider for possible PT consult) Does the patient have a NEW difficulty with communication that is expected to last >3 days?: No Is the patient deaf or have difficulty hearing?: No Does the patient have difficulty seeing, even when wearing glasses/contacts?: No Does the patient have difficulty concentrating, remembering, or making decisions?: No  Permission Sought/Granted                  Emotional Assessment Appearance:: Appears stated age Attitude/Demeanor/Rapport: Engaged Affect (typically observed): Accepting Orientation: : Oriented to Self, Oriented to  Time, Oriented to Place, Oriented to Situation Alcohol / Substance Use: Not Applicable Psych Involvement: No (comment)  Admission diagnosis:  Hypokalemia [E87.6] COPD exacerbation (HCC) [J44.1] Acute and chronic respiratory failure with hypoxia (HCC) [J96.21] AKI (acute kidney injury) [N17.9] Acute on chronic hypoxic respiratory failure (HCC) [J96.21] Patient Active Problem List   Diagnosis Date Noted   Acute and chronic respiratory failure with hypoxia (HCC) 04/10/2024   Angioedema 05/08/2023   Dysphagia 02/12/2022   COPD GOLD 1  11/02/2020   Chronic respiratory failure with hypoxia (HCC) 11/02/2020   Cigarette smoker 11/02/2020   DOE (dyspnea on exertion) 08/16/2020   AKI (acute kidney injury) 08/16/2020   Dehydration 08/16/2020   Leukocytosis 08/16/2020   Thrombocytosis 08/16/2020   Essential hypertension  08/16/2020   Elevated d-dimer 08/16/2020   Hypokalemia 08/16/2020   Anxiety 08/16/2020   COPD with acute exacerbation (HCC) 08/16/2020   Hyperlipidemia 08/16/2020   Diabetes mellitus (HCC) 08/16/2020   GERD (gastroesophageal reflux disease) 08/16/2020   CAD (coronary artery disease) 08/16/2020   Fibromyalgia 05/24/2018   Chronic, continuous use of opioids 06/03/2017   Cigarette nicotine  dependence without complication 06/03/2017   Inflammatory arthritis 03/20/2017   Iron deficiency anemia 02/13/2017   Primary osteoarthritis 04/01/2016   Pernicious anemia 03/26/2016   B12 deficiency 03/08/2016   PTSD (post-traumatic stress disorder) 11/06/2015   Anxiety, generalized 01/06/2015   Chronic bilateral low back pain without sciatica 01/06/2015   Major depressive disorder with single episode, 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 [place] [address] Phone: [phone] Fax: [phone]  [facility] [place] - [place], [place] - [address] [place] [place] [address] Phone: [phone] Fax: [phone]     Social Drivers of Health (SDOH) Social History: SDOH Screenings   Food Insecurity: Unknown (04/10/2024)  Housing: Low Risk  (04/10/2024)  Transportation Needs: No Transportation Needs (04/10/2024)  Utilities: Not At Risk (04/10/2024)  Financial Resource Strain: Medium Risk (08/13/2023)   [facility] from [facility]  Social Connections: Moderately Isolated (04/10/2024)  Tobacco Use: High Risk (12/17/2023)  Health Literacy: Medium Risk (08/13/2023)   Received from [facility]   SDOH Interventions:     Readmission Risk Interventions    04/12/2024    1:34 PM  Readmission Risk Prevention Plan  Transportation Screening Complete  HRI or Home Care Consult Complete  Social Work Consult for Recovery Care Planning/Counseling Complete  Palliative Care Screening Not Applicable  Medication Review Oceanographer) Complete

## 2024-04-12 NOTE — Plan of Care (Signed)

## 2024-04-13 DIAGNOSIS — J9611 Chronic respiratory failure with hypoxia: Secondary | ICD-10-CM

## 2024-04-13 DIAGNOSIS — F17210 Nicotine dependence, cigarettes, uncomplicated: Secondary | ICD-10-CM | POA: Diagnosis not present

## 2024-04-13 DIAGNOSIS — J9621 Acute and chronic respiratory failure with hypoxia: Secondary | ICD-10-CM | POA: Diagnosis not present

## 2024-04-13 DIAGNOSIS — J441 Chronic obstructive pulmonary disease with (acute) exacerbation: Secondary | ICD-10-CM | POA: Diagnosis not present

## 2024-04-13 DIAGNOSIS — D72829 Elevated white blood cell count, unspecified: Secondary | ICD-10-CM | POA: Diagnosis not present

## 2024-04-13 LAB — GLUCOSE, CAPILLARY
Glucose-Capillary: 116 mg/dL — ABNORMAL HIGH (ref 70–99)
Glucose-Capillary: 103 mg/dL — ABNORMAL HIGH (ref 70–99)
Glucose-Capillary: 150 mg/dL — ABNORMAL HIGH (ref 70–99)

## 2024-04-13 MED ORDER — DOXYCYCLINE HYCLATE 100 MG PO CAPS
100.0000 mg | ORAL_CAPSULE | Freq: Two times a day (BID) | ORAL | 0 refills | Status: AC
Start: 2024-04-14 — End: 2024-04-17

## 2024-04-13 MED ORDER — PREDNISONE 20 MG PO TABS: ORAL_TABLET | ORAL | 0 refills | Status: AC

## 2024-04-13 NOTE — Plan of Care (Signed)
  Problem: Health Behavior/Discharge Planning: Goal: Ability to manage health-related needs will improve Outcome: Progressing   Problem: Clinical Measurements: Goal: Will remain free from infection Outcome: Progressing Goal: Respiratory complications will improve Outcome: Progressing   Problem: Activity: Goal: Risk for activity intolerance will decrease Outcome: Progressing   Problem: Coping: Goal: Level of anxiety will decrease Outcome: Progressing   Problem: Elimination: Goal: Will not experience complications related to urinary retention Outcome: Progressing   Problem: Pain Managment: Goal: General experience of comfort will improve and/or be controlled Outcome: Progressing

## 2024-04-13 NOTE — Discharge Instructions (Signed)
 IMPORTANT INFORMATION: PAY CLOSE ATTENTION   PHYSICIAN DISCHARGE INSTRUCTIONS  Follow with Primary care provider  [facility], [facility]  and other consultants as instructed by your Hospitalist Physician  SEEK MEDICAL CARE OR RETURN TO EMERGENCY ROOM IF SYMPTOMS COME BACK, WORSEN OR NEW PROBLEM DEVELOPS   Please note: You were cared for by a hospitalist during your hospital stay. Every effort will be made to forward records to your primary care provider.  You can request that your primary care provider send for your hospital records if they have not received them.  Once you are discharged, your primary care physician will handle any further medical issues. Please note that NO REFILLS for any discharge medications will be authorized once you are discharged, as it is imperative that you return to your primary care physician (or establish a relationship with a primary care physician if you do not have one) for your post hospital discharge needs so that they can reassess your need for medications and monitor your lab values.  Please get a complete blood count and chemistry panel checked by your Primary MD at your next visit, and again as instructed by your Primary MD.  Get Medicines reviewed and adjusted: Please take all your medications with you for your next visit with your Primary MD  Laboratory/radiological data: Please request your Primary MD to go over all hospital tests and procedure/radiological results at the follow up, please ask your primary care provider to get all Hospital records sent to his/her office.  In some cases, they will be blood work, cultures and biopsy results pending at the time of your discharge. Please request that your primary care provider follow up on these results.  If you are diabetic, please bring your blood sugar readings with you to your follow up appointment with primary care.    Please call and make your follow up appointments as soon as possible.    Also  Note the following: If you experience worsening of your admission symptoms, develop shortness of breath, life threatening emergency, suicidal or homicidal thoughts you must seek medical attention immediately by calling 911 or calling your MD immediately  if symptoms less severe.  You must read complete instructions/literature along with all the possible adverse reactions/side effects for all the Medicines you take and that have been prescribed to you. Take any new Medicines after you have completely understood and accpet all the possible adverse reactions/side effects.   Do not drive when taking Pain medications or sleeping medications (Benzodiazepines)  Do not take more than prescribed Pain, Sleep and Anxiety Medications. It is not advisable to combine anxiety,sleep and pain medications without talking with your primary care practitioner  Special Instructions: If you have smoked or chewed Tobacco  in the last 2 yrs please stop smoking, stop any regular Alcohol  and or any Recreational drug use.  Wear Seat belts while driving.  Do not drive if taking any narcotic, mind altering or controlled substances or recreational drugs or alcohol.

## 2024-04-13 NOTE — Evaluation (Signed)
 Occupational Therapy Evaluation Patient Details Name: Candace Harris MRN: [id number] DOB: 12/08/1950 Today's Date: 04/13/2024   History of Present Illness   Candace Harris is a 73 year old female with chronic hypoxic respiratory failure chronically on 3 L oxygen , tobacco abuse, GAD, history of PTSD, type 2 diabetes mellitus, fibromyalgia and depression, hypertension, COPD, GERD presented to the ED by [facility] for difficulty breathing.  Patient reports that she has had a difficult time breathing more than normal over the past several days and reports she has had malaise and feeling unwell for the past week with poor appetite and significant coughing and wheezing.  Her cough has been productive of a thick greenish sputum.  EMS found her at home on 4 L nasal cannula but oxygenating at 87%.  They found her to be dyspneic and having physical exam findings of severe diffuse wheezing.  She was given a dose of IV Solu-Medrol  and route and albuterol  nebulizer treatment.  She presented with a leukocytosis, she was afebrile, her labs revealed a mild AKI and hypokalemia with a potassium of 2.6.  Chest x-ray with no acute findings reported.  ED provider reported purulent sputum production and started her on broad-spectrum antibiotic coverage for CAP.  Admission was requested for treatment of acute COPD exacerbation with acute on chronic respiratory failure. (per MD)     Clinical Impressions Pt agreeable to OT and PT co-evaluation. Pt on 2 L supplemental O2 in the room. Pt uses O2 at baseline per chart. Pt required no physical assist for bed mobility and CGA to min A for transfers with the RW. B UE generally weak but WFL A/ROM. Pt at level of CGA to set up assist for lower body ADL's if seated. Pt left in the chair with call bell within reach. Pt will benefit from continued OT in the hospital to increase strength, balance, and endurance for safe ADL's.        If plan is discharge home, recommend the following:   A  little help with walking and/or transfers;A little help with bathing/dressing/bathroom;Assistance with cooking/housework;Assist for transportation;Help with stairs or ramp for entrance     Functional Status Assessment   Patient has had a recent decline in their functional status and demonstrates the ability to make significant improvements in function in a reasonable and predictable amount of time.     Equipment Recommendations   None recommended by OT             Precautions/Restrictions   Precautions Precautions: Fall Recall of Precautions/Restrictions: Intact Restrictions Weight Bearing Restrictions Per Provider Order: No     Mobility Bed Mobility Overal bed mobility: Needs Assistance Bed Mobility: Supine to Sit     Supine to sit: Supervision, HOB elevated     General bed mobility comments: labored movement    Transfers Overall transfer level: Needs assistance Equipment used: Rolling walker (2 wheels) Transfers: Sit to/from Stand, Bed to chair/wheelchair/BSC Sit to Stand: Min assist, Contact guard assist     Step pivot transfers: Min assist, Contact guard assist     General transfer comment: labored movement; extended time      Balance Overall balance assessment: Needs assistance Sitting-balance support: No upper extremity supported, Feet supported Sitting balance-Leahy Scale: Fair Sitting balance - Comments: seated at EOB   Standing balance support: Bilateral upper extremity supported, During functional activity, Reliant on assistive device for balance Standing balance-Leahy Scale: Fair Standing balance comment: using RW  ADL either performed or assessed with clinical judgement   ADL Overall ADL's : Needs assistance/impaired     Grooming: Set up;Sitting   Upper Body Bathing: Set up;Sitting   Lower Body Bathing: Contact guard assist;Set up;Sitting/lateral leans   Upper Body Dressing : Set up;Sitting    Lower Body Dressing: Contact guard assist;Set up;Sitting/lateral leans Lower Body Dressing Details (indicate cue type and reason): Able to doff and don sock seated at EOB. Toilet Transfer: Minimal assistance;Contact guard assist;Rolling walker (2 wheels);Ambulation Toilet Transfer Details (indicate cue type and reason): Ambualtion in the room with RW. Toileting- Clothing Manipulation and Hygiene: Contact guard assist;Set up;Sit to/from stand;Sitting/lateral lean       Functional mobility during ADLs: Contact guard assist;Minimal assistance;Rolling walker (2 wheels) General ADL Comments: Able to ambulate a short distance with RW. Labored breathing and movement.     Vision Baseline Vision/History: 1 Wears glasses Ability to See in Adequate Light: 0 Adequate Patient Visual Report: No change from baseline Vision Assessment?: No apparent visual deficits     Perception Perception: Not tested       Praxis Praxis: Not tested       Pertinent Vitals/Pain Pain Assessment Pain Assessment: Faces Faces Pain Scale: No hurt     Extremity/Trunk Assessment Upper Extremity Assessment Upper Extremity Assessment: Generalized weakness;Overall [facility] for tasks assessed   Lower Extremity Assessment Lower Extremity Assessment: Defer to PT evaluation   Cervical / Trunk Assessment Cervical / Trunk Assessment: Kyphotic   Communication Communication Communication: Impaired Factors Affecting Communication: Hearing impaired   Cognition Arousal: Alert Behavior During Therapy: WFL for tasks assessed/performed Cognition: No apparent impairments                               Following commands: Intact       Cueing  General Comments   Cueing Techniques: Verbal cues;Gestural cues;Tactile cues;Visual cues                 Home Living Family/patient expects to be discharged to:: Private residence Living Arrangements: Children Available Help at Discharge: Family;Personal care  attendant Type of Home: Apartment Home Access: Stairs to enter [employer] of Steps: 71 (Family reportedly carries her up the stairs per pt report and chart confirmation.) Entrance Stairs-Rails: Right;Left Home Layout: One level     Bathroom Shower/Tub: Chief Strategy Officer: Handicapped height     Home Equipment: Agricultural consultant (2 wheels);Hand held shower head;Rollator (4 wheels);Cane - single point;Grab bars - tub/shower   Additional Comments: PCA daily for 2 to 3 hours per pt report.      Prior Functioning/Environment Prior Level of Function : Needs assist;History of Falls (last six months) (1 fall in 6 months)       Physical Assist : ADLs (physical)   ADLs (physical): IADLs;Bathing;Dressing;Toileting Mobility Comments: Household ambulator with RW. ADLs Comments: Assist for bathing, dressing, toileting, and IADL's.    OT Problem List: Decreased activity tolerance;Impaired balance (sitting and/or standing)   OT Treatment/Interventions: Self-care/ADL training;Therapeutic exercise;Therapeutic activities;Patient/family education;Balance training      OT Goals(Current goals can be found in the care plan section)   Acute Rehab OT Goals Patient Stated Goal: return home OT Goal Formulation: With patient Time For Goal Achievement: 04/27/24 Potential to Achieve Goals: Good   OT Frequency:  Min 1X/week    Co-evaluation PT/OT/SLP Co-Evaluation/Treatment: Yes Reason for Co-Treatment: To address functional/ADL transfers   OT goals addressed during session: ADL's  and self-care                       End of Session Equipment Utilized During Treatment: Oxygen ;Rolling walker (2 wheels)  Activity Tolerance: Patient tolerated treatment well Patient left: in chair;with call bell/phone within reach  OT Visit Diagnosis: Unsteadiness on feet (R26.81);Other abnormalities of gait and mobility (R26.89);Muscle weakness (generalized) (M62.81);History of  falling (Z91.81)                Time: 8993-8970 OT Time Calculation (min): 23 min Charges:  OT General Charges $OT Visit: 1 Visit OT Evaluation $OT Eval Low Complexity: 1 Low  Jericha Bryden OT, MOT   Jayson Person 04/13/2024, 12:14 PM

## 2024-04-13 NOTE — Discharge Summary (Signed)
 Physician Discharge Summary  Candace Harris FMW:[id number] DOB: [birth year] DOA: 04/10/2024  PCP: [facility]  Admit date: 04/10/2024 Discharge date: 04/13/2024  Disposition:  Home   Recommendations for Outpatient Follow-up:  Follow up with PCP in 1 weeks Please recheck BMP in 1 week   Home Health: [[facility]]  Discharge Condition: STABLE   CODE STATUS: Full DIET: resume regular diet    Brief Hospitalization Summary: Please see all hospital notes, images, labs for full details of the hospitalization. Admission provider HPI:  73 year old female with chronic hypoxic respiratory failure chronically on 3 L oxygen , tobacco abuse, GAD, history of PTSD, type 2 diabetes mellitus, fibromyalgia and depression, hypertension, COPD, GERD presented to the ED by [facility] for difficulty breathing.  Patient reports that she has had a difficult time breathing more than normal over the past several days and reports she has had malaise and feeling unwell for the past week with poor appetite and significant coughing and wheezing.  Her cough has been productive of a thick greenish sputum.  EMS found her at home on 4 L nasal cannula but oxygenating at 87%.  They found her to be dyspneic and having physical exam findings of severe diffuse wheezing.  She was given a dose of IV Solu-Medrol  and route and albuterol  nebulizer treatment.  She presented with a leukocytosis, she was afebrile, her labs revealed a mild AKI and hypokalemia with a potassium of 2.6.  Chest x-ray with no acute findings reported.  ED provider reported purulent sputum production and started her on broad-spectrum antibiotic coverage for CAP.  Admission was requested for treatment of acute COPD exacerbation with acute on chronic respiratory failure.  Hospital Course by listed problems addressed  Acute on chronic respiratory failure with hypoxia -- Secondary to acute COPD exacerbation -- Continue supplemental oxygen  support -- Continue  aggressive COPD exacerbation treatments -- she reports feeling back to baseline and wanting to go home   Acute COPD exacerbation -- treated with IV steroids -- treated with IV antibiotics ordered: ceftriaxone and azithromycin  -- scheduled bronchodilators, RT rounding and adjustment  -- added flutter valve  -- DC home on oral prednisone  taper and antibiotics to complete course   Leukocytosis  -- pt received IV solumedrol 125 mg en route to ED by EMS -- anticipating steroid induced leukemoid reaction -- continue antibiotics as ordered -- follow blood cultures--no growth to date  -- procalcitonin down to 0.22 -- WBC trending down    Dehydration - treated and resolved  AKI -- RESOLVED  -- pt reports very poor oral intake over last month but now improving -- IV fluid hydration completed    Hypokalemia  -- IV replacement ordered and given -- IV magnesium  ordered and given -- recheck BMP in 1 week with PCP   GERD -- pantoprazole  for GI protection in hospital -- resume home PPI at discharge    GAD -- resume home meds   Discharge Diagnoses:  Principal Problem:   Acute and chronic respiratory failure with hypoxia (HCC) Active Problems:   DOE (dyspnea on exertion)   Dehydration   Leukocytosis   Essential hypertension   Hypokalemia   Anxiety   COPD with acute exacerbation (HCC)   Hyperlipidemia   Diabetes mellitus (HCC)   GERD (gastroesophageal reflux disease)   CAD (coronary artery disease)   COPD GOLD 1    Chronic respiratory failure with hypoxia (HCC)   Cigarette smoker   Dysphagia   PTSD (post-traumatic stress disorder)   B12 deficiency   Fibromyalgia  Chronic, continuous use of opioids   Iron deficiency anemia   Discharge Instructions:  Allergies as of 04/13/2024       Reactions   Alendronate Other (See Comments)   Sore muscles, burning in chest Sore muscles, burning in chest   Ibuprofen Nausea And Vomiting   Other reaction(s): Unknown   Ramipril Nausea  And Vomiting, Other (See Comments)   Sulfabenzamide Other (See Comments)   HEADACHE   Duloxetine    Other Reaction(s): shaking   Lumateperone Nausea And Vomiting   Varenicline Nausea And Vomiting        Medication List     STOP taking these medications    megestrol 20 MG tablet Commonly known as: MEGACE       TAKE these medications    acetaminophen  650 MG CR tablet Commonly known as: TYLENOL  Take 650 mg by mouth every 8 (eight) hours as needed for pain.   albuterol  108 (90 Base) MCG/ACT inhaler Commonly known as: VENTOLIN  HFA Inhale 2 puffs into the lungs every 4 (four) hours as needed for wheezing or shortness of breath. for wheezing   amLODipine  5 MG tablet Commonly known as: NORVASC  Take 5 mg by mouth daily.   baclofen 10 MG tablet Commonly known as: LIORESAL Take 10 mg by mouth 2 (two) times daily.   budesonide -formoterol  160-4.5 MCG/ACT inhaler Commonly known as: SYMBICORT  INHALE 2 PUFFS INTO THE LUNGS IN THE MORNING AND AT BEDTIME   busPIRone 5 MG tablet Commonly known as: BUSPAR Take 5 mg by mouth. TAKE ONE TABLET BY MOUTH TWICE DAILY @ 9AM & 5PM FOR 90 DAYS   clonazePAM 0.5 MG tablet Commonly known as: KLONOPIN Take 0.5 mg by mouth 2 (two) times daily.   dicyclomine 20 MG tablet Commonly known as: BENTYL Take 20 mg by mouth 4 (four) times daily.   doxycycline 100 MG capsule Commonly known as: VIBRAMYCIN Take 1 capsule (100 mg total) by mouth 2 (two) times daily for 3 days. Start taking on: April 14, 2024   famotidine  20 MG tablet Commonly known as: PEPCID  TAKE ONE TABLET BY MOUTH DAILY AFTER SUPPER   ipratropium 0.02 % nebulizer solution Commonly known as: ATROVENT Inhale 0.5 mg into the lungs every 4 (four) hours as needed for wheezing.   latanoprost 0.005 % ophthalmic solution Commonly known as: XALATAN Place 1 drop into both eyes at bedtime.   levalbuterol 1.25 MG/3ML nebulizer solution Commonly known as: XOPENEX Inhale 1.25 mg  into the lungs.   losartan 50 MG tablet Commonly known as: COZAAR Take 50 mg by mouth every morning.   metFORMIN 500 MG tablet Commonly known as: GLUCOPHAGE Take 500 mg by mouth 2 (two) times daily with a meal.   Narcan 4 MG/0.1ML Liqd nasal spray kit Generic drug: naloxone Place 0.4 mg into the nose once.   nitroGLYCERIN 0.4 MG SL tablet Commonly known as: NITROSTAT Place 0.4 mg under the tongue every 5 (five) minutes as needed for chest pain.   omeprazole  40 MG capsule Commonly known as: PRILOSEC Take 1 capsule (40 mg total) by mouth 2 (two) times daily before a meal. TAKE 1 CAPSULE 30 TO 60 MINUTES BEFORE FIRST MEAL OF THE DAY   ondansetron 4 MG tablet Commonly known as: ZOFRAN Take 4 mg by mouth daily.   oxyCODONE-acetaminophen  7.5-325 MG tablet Commonly known as: PERCOCET Take 1 tablet by mouth 4 (four) times daily as needed for moderate pain (pain score 4-6) or severe pain (pain score 7-10).   predniSONE  20  MG tablet Commonly known as: DELTASONE  Take 3 PO QAM x3days, 2 PO QAM x3days, 1 PO QAM x3days Start taking on: April 14, 2024   rosuvastatin  20 MG tablet Commonly known as: CRESTOR  Take 20 mg by mouth at bedtime.   sertraline 100 MG tablet Commonly known as: ZOLOFT Take 100 mg by mouth every morning.        Follow-up Information     [facility]. Schedule an appointment as soon as possible for a visit in 1 week(s).   Why: Hospital Follow Up Contact information: [address] [place] [address] [phone]                Allergies  Allergen Reactions   Alendronate Other (See Comments)    Sore muscles, burning in chest Sore muscles, burning in chest    Ibuprofen Nausea And Vomiting    Other reaction(s): Unknown    Ramipril Nausea And Vomiting and Other (See Comments)   Sulfabenzamide Other (See Comments)    HEADACHE    Duloxetine     Other Reaction(s): shaking   Lumateperone Nausea And Vomiting   Varenicline Nausea  And Vomiting   Allergies as of 04/13/2024       Reactions   Alendronate Other (See Comments)   Sore muscles, burning in chest Sore muscles, burning in chest   Ibuprofen Nausea And Vomiting   Other reaction(s): Unknown   Ramipril Nausea And Vomiting, Other (See Comments)   Sulfabenzamide Other (See Comments)   HEADACHE   Duloxetine    Other Reaction(s): shaking   Lumateperone Nausea And Vomiting   Varenicline Nausea And Vomiting        Medication List     STOP taking these medications    megestrol 20 MG tablet Commonly known as: MEGACE       TAKE these medications    acetaminophen  650 MG CR tablet Commonly known as: TYLENOL  Take 650 mg by mouth every 8 (eight) hours as needed for pain.   albuterol  108 (90 Base) MCG/ACT inhaler Commonly known as: VENTOLIN  HFA Inhale 2 puffs into the lungs every 4 (four) hours as needed for wheezing or shortness of breath. for wheezing   amLODipine  5 MG tablet Commonly known as: NORVASC  Take 5 mg by mouth daily.   baclofen 10 MG tablet Commonly known as: LIORESAL Take 10 mg by mouth 2 (two) times daily.   budesonide -formoterol  160-4.5 MCG/ACT inhaler Commonly known as: SYMBICORT  INHALE 2 PUFFS INTO THE LUNGS IN THE MORNING AND AT BEDTIME   busPIRone 5 MG tablet Commonly known as: BUSPAR Take 5 mg by mouth. TAKE ONE TABLET BY MOUTH TWICE DAILY @ 9AM & 5PM FOR 90 DAYS   clonazePAM 0.5 MG tablet Commonly known as: KLONOPIN Take 0.5 mg by mouth 2 (two) times daily.   dicyclomine 20 MG tablet Commonly known as: BENTYL Take 20 mg by mouth 4 (four) times daily.   doxycycline 100 MG capsule Commonly known as: VIBRAMYCIN Take 1 capsule (100 mg total) by mouth 2 (two) times daily for 3 days. Start taking on: April 14, 2024   famotidine  20 MG tablet Commonly known as: PEPCID  TAKE ONE TABLET BY MOUTH DAILY AFTER SUPPER   ipratropium 0.02 % nebulizer solution Commonly known as: ATROVENT Inhale 0.5 mg into the lungs every 4  (four) hours as needed for wheezing.   latanoprost 0.005 % ophthalmic solution Commonly known as: XALATAN Place 1 drop into both eyes at bedtime.   levalbuterol 1.25 MG/3ML nebulizer solution Commonly  known as: XOPENEX Inhale 1.25 mg into the lungs.   losartan 50 MG tablet Commonly known as: COZAAR Take 50 mg by mouth every morning.   metFORMIN 500 MG tablet Commonly known as: GLUCOPHAGE Take 500 mg by mouth 2 (two) times daily with a meal.   Narcan 4 MG/0.1ML Liqd nasal spray kit Generic drug: naloxone Place 0.4 mg into the nose once.   nitroGLYCERIN 0.4 MG SL tablet Commonly known as: NITROSTAT Place 0.4 mg under the tongue every 5 (five) minutes as needed for chest pain.   omeprazole  40 MG capsule Commonly known as: PRILOSEC Take 1 capsule (40 mg total) by mouth 2 (two) times daily before a meal. TAKE 1 CAPSULE 30 TO 60 MINUTES BEFORE FIRST MEAL OF THE DAY   ondansetron 4 MG tablet Commonly known as: ZOFRAN Take 4 mg by mouth daily.   oxyCODONE-acetaminophen  7.5-325 MG tablet Commonly known as: PERCOCET Take 1 tablet by mouth 4 (four) times daily as needed for moderate pain (pain score 4-6) or severe pain (pain score 7-10).   predniSONE  20 MG tablet Commonly known as: DELTASONE  Take 3 PO QAM x3days, 2 PO QAM x3days, 1 PO QAM x3days Start taking on: April 14, 2024   rosuvastatin  20 MG tablet Commonly known as: CRESTOR  Take 20 mg by mouth at bedtime.   sertraline 100 MG tablet Commonly known as: ZOLOFT Take 100 mg by mouth every morning.        Procedures/Studies: DG Chest Port 1 View Result Date: 04/10/2024 CLINICAL DATA:  Wheezing, difficulty breathing EXAM: PORTABLE CHEST 1 VIEW COMPARISON:  08/16/2023, 08/13/2023 FINDINGS: Single frontal view of the chest demonstrates a stable cardiac silhouette. Stable prominence of the central pulmonary vasculature. No acute airspace disease, effusion, or pneumothorax. No acute bony abnormalities. IMPRESSION: 1.  Chronic prominence of the central pulmonary vascular consistent with pulmonary arterial hypertension. 2. No acute airspace disease. Electronically Signed   By: Ozell Daring M.D.   On: 04/10/2024 12:54     Subjective: Pt says she is breathing much better now and back to her baseline and wishes to go home today.  No complaints.   Discharge Exam: Vitals:   04/13/24 0810 04/13/24 0813  BP:    Pulse:    Resp:    Temp:    SpO2: 97% 100%   Vitals:   04/12/24 2033 04/13/24 0300 04/13/24 0810 04/13/24 0813  BP: (!) 99/59 118/75    Pulse: 86 78    Resp: 16 16    Temp: 98.2 F (36.8 C) 98.3 F (36.8 C)    TempSrc: Oral Oral    SpO2: 95% 100% 97% 100%  Weight:      Height:       General: Pt is alert, awake, not in acute distress Cardiovascular: RRR, S1/S2 +, no rubs, no gallops Respiratory: good air movement with no rales heard, rare expiratory wheezing, no increased work of breathing.  Abdominal: Soft, NT, ND, bowel sounds + Extremities: no edema, no cyanosis   The results of significant diagnostics from this hospitalization (including imaging, microbiology, ancillary and laboratory) are listed below for reference.     Microbiology: Recent Results (from the past 240 hours)  Blood culture (routine x 2)     Status: None (Preliminary result)   Collection Time: 04/10/24  1:12 PM   Specimen: BLOOD RIGHT FOREARM  Result Value Ref Range Status   Specimen Description   Final    BLOOD RIGHT FOREARM BOTTLES DRAWN AEROBIC AND ANAEROBIC   Special  Requests Blood Culture adequate volume  Final   Culture   Final    NO GROWTH 3 DAYS Performed at [facility], [address] [place], [place] [address]    Report Status PENDING  Incomplete  Blood culture (routine x 2)     Status: None (Preliminary result)   Collection Time: 04/10/24  1:12 PM   Specimen: Left Antecubital; Blood  Result Value Ref Range Status   Specimen Description   Final    LEFT ANTECUBITAL BOTTLES DRAWN AEROBIC AND  ANAEROBIC   Special Requests Blood Culture adequate volume  Final   Culture   Final    NO GROWTH 3 DAYS Performed at [facility], [address], [place], [place] [address]    Report Status PENDING  Incomplete  Resp panel by RT-PCR (RSV, Flu A&B, Covid) Anterior Nasal Swab     Status: None   Collection Time: 04/10/24  2:26 PM   Specimen: Anterior Nasal Swab  Result Value Ref Range Status   SARS Coronavirus 2 by RT PCR NEGATIVE NEGATIVE Final    Comment: (NOTE) SARS-CoV-2 target nucleic acids are NOT DETECTED.  The SARS-CoV-2 RNA is generally detectable in upper respiratory specimens during the acute phase of infection. The lowest concentration of SARS-CoV-2 viral copies this assay can detect is 138 copies/mL. A negative result does not preclude SARS-Cov-2 infection and should not be used as the sole basis for treatment or other patient management decisions. A negative result may occur with  improper specimen collection/handling, submission of specimen other than nasopharyngeal swab, presence of viral mutation(s) within the areas targeted by this assay, and inadequate number of viral copies(<138 copies/mL). A negative result must be combined with clinical observations, patient history, and epidemiological information. The expected result is Negative.  Fact Sheet for Patients:  BloggerCourse.com  Fact Sheet for Healthcare Providers:  SeriousBroker.it  This test is no t yet approved or cleared by the [place]  FDA and  has been authorized for detection and/or diagnosis of SARS-CoV-2 by FDA under an Emergency Use Authorization (EUA). This EUA will remain  in effect (meaning this test can be used) for the duration of the COVID-19 declaration under Section 564(b)(1) of the Act, 21 U.S.C.section 360bbb-3(b)(1), unless the authorization is terminated  or revoked sooner.       Influenza A by PCR NEGATIVE NEGATIVE Final    Influenza B by PCR NEGATIVE NEGATIVE Final    Comment: (NOTE) The Xpert Xpress SARS-CoV-2/FLU/RSV plus assay is intended as an aid in the diagnosis of influenza from Nasopharyngeal swab specimens and should not be used as a sole basis for treatment. Nasal washings and aspirates are unacceptable for Xpert Xpress SARS-CoV-2/FLU/RSV testing.  Fact Sheet for Patients: BloggerCourse.com  Fact Sheet for Healthcare Providers: SeriousBroker.it  This test is not yet approved or cleared by the [place]  FDA and has been authorized for detection and/or diagnosis of SARS-CoV-2 by FDA under an Emergency Use Authorization (EUA). This EUA will remain in effect (meaning this test can be used) for the duration of the COVID-19 declaration under Section 564(b)(1) of the Act, 21 U.S.C. section 360bbb-3(b)(1), unless the authorization is terminated or revoked.     Resp Syncytial Virus by PCR NEGATIVE NEGATIVE Final    Comment: (NOTE) Fact Sheet for Patients: BloggerCourse.com  Fact Sheet for Healthcare Providers: SeriousBroker.it  This test is not yet approved or cleared by the [place]  FDA and has been authorized for detection and/or diagnosis of SARS-CoV-2 by FDA under an Emergency Use  Authorization (EUA). This EUA will remain in effect (meaning this test can be used) for the duration of the COVID-19 declaration under Section 564(b)(1) of the Act, 21 U.S.C. section 360bbb-3(b)(1), unless the authorization is terminated or revoked.  Performed at [facility], [address], [place], [place] [address]      Labs: BNP (last 3 results) No results for input(s): BNP in the last 8760 hours. Basic Metabolic Panel: Recent Labs  Lab 04/10/24 1228 04/10/24 1312 04/10/24 1448 04/11/24 0426 04/12/24 0514  NA 134*  --   --  136 143  K 2.6*  --   --  3.5 3.0*  CL 88*  --   --  100 102   CO2 27  --   --  26 30  GLUCOSE 95  --   --  129* 99  BUN 15  --   --  14 12  CREATININE 1.22*  --  1.15* 1.02* 0.97  CALCIUM  9.0  --   --  8.9 9.0  MG  --  1.9  --  2.4 2.0   Liver Function Tests: No results for input(s): AST, ALT, ALKPHOS, BILITOT, PROT, ALBUMIN  in the last 168 hours. No results for input(s): LIPASE, AMYLASE in the last 168 hours. No results for input(s): AMMONIA in the last 168 hours. CBC: Recent Labs  Lab 04/10/24 1228 04/10/24 1312 04/10/24 1448 04/11/24 0426 04/12/24 0514  WBC 19.9*  --  22.3* 18.3* 15.5*  NEUTROABS  --  17.1*  --  17.1* 13.4*  HGB 10.4*  --  10.0* 9.3* 9.3*  HCT 33.7*  --  32.3* 29.5* 29.5*  MCV 84.9  --  84.3 81.7 82.6  PLT 345  --  375 346 369   Cardiac Enzymes: No results for input(s): CKTOTAL, CKMB, CKMBINDEX, TROPONINI in the last 168 hours. BNP: Invalid input(s): POCBNP CBG: Recent Labs  Lab 04/12/24 1158 04/12/24 1621 04/12/24 1942 04/13/24 0251 04/13/24 0727  GLUCAP 155* 187* 184* 116* 103*   D-Dimer No results for input(s): DDIMER in the last 72 hours. Hgb A1c Recent Labs    04/10/24 1448  HGBA1C 6.1*   Lipid Profile No results for input(s): CHOL, HDL, LDLCALC, TRIG, CHOLHDL, LDLDIRECT in the last 72 hours. Thyroid  function studies No results for input(s): TSH, T4TOTAL, T3FREE, THYROIDAB in the last 72 hours.  Invalid input(s): FREET3 Anemia work up No results for input(s): VITAMINB12, FOLATE, FERRITIN, TIBC, IRON, RETICCTPCT in the last 72 hours. Urinalysis    Component Value Date/Time   COLORURINE YELLOW 07/15/2020 1043   APPEARANCEUR CLEAR 07/15/2020 1043   LABSPEC 1.004 (L) 07/15/2020 1043   PHURINE 7.0 07/15/2020 1043   GLUCOSEU NEGATIVE 07/15/2020 1043   HGBUR NEGATIVE 07/15/2020 1043   BILIRUBINUR NEGATIVE 07/15/2020 1043   KETONESUR NEGATIVE 07/15/2020 1043   PROTEINUR NEGATIVE 07/15/2020 1043   NITRITE NEGATIVE 07/15/2020 1043    LEUKOCYTESUR NEGATIVE 07/15/2020 1043   Sepsis Labs Recent Labs  Lab 04/10/24 1228 04/10/24 1448 04/11/24 0426 04/12/24 0514  WBC 19.9* 22.3* 18.3* 15.5*   Microbiology Recent Results (from the past 240 hours)  Blood culture (routine x 2)     Status: None (Preliminary result)   Collection Time: 04/10/24  1:12 PM   Specimen: BLOOD RIGHT FOREARM  Result Value Ref Range Status   Specimen Description   Final    BLOOD RIGHT FOREARM BOTTLES DRAWN AEROBIC AND ANAEROBIC   Special Requests Blood Culture adequate volume  Final   Culture   Final  NO GROWTH 3 DAYS Performed at [facility], [address], [place], [place] [address]    Report Status PENDING  Incomplete  Blood culture (routine x 2)     Status: None (Preliminary result)   Collection Time: 04/10/24  1:12 PM   Specimen: Left Antecubital; Blood  Result Value Ref Range Status   Specimen Description   Final    LEFT ANTECUBITAL BOTTLES DRAWN AEROBIC AND ANAEROBIC   Special Requests Blood Culture adequate volume  Final   Culture   Final    NO GROWTH 3 DAYS Performed at [facility], [address], [place], [place] [address]    Report Status PENDING  Incomplete  Resp panel by RT-PCR (RSV, Flu A&B, Covid) Anterior Nasal Swab     Status: None   Collection Time: 04/10/24  2:26 PM   Specimen: Anterior Nasal Swab  Result Value Ref Range Status   SARS Coronavirus 2 by RT PCR NEGATIVE NEGATIVE Final    Comment: (NOTE) SARS-CoV-2 target nucleic acids are NOT DETECTED.  The SARS-CoV-2 RNA is generally detectable in upper respiratory specimens during the acute phase of infection. The lowest concentration of SARS-CoV-2 viral copies this assay can detect is 138 copies/mL. A negative result does not preclude SARS-Cov-2 infection and should not be used as the sole basis for treatment or other patient management decisions. A negative result may occur with  improper specimen collection/handling, submission of specimen other than  nasopharyngeal swab, presence of viral mutation(s) within the areas targeted by this assay, and inadequate number of viral copies(<138 copies/mL). A negative result must be combined with clinical observations, patient history, and epidemiological information. The expected result is Negative.  Fact Sheet for Patients:  BloggerCourse.com  Fact Sheet for Healthcare Providers:  SeriousBroker.it  This test is no t yet approved or cleared by the [place]  FDA and  has been authorized for detection and/or diagnosis of SARS-CoV-2 by FDA under an Emergency Use Authorization (EUA). This EUA will remain  in effect (meaning this test can be used) for the duration of the COVID-19 declaration under Section 564(b)(1) of the Act, 21 U.S.C.section 360bbb-3(b)(1), unless the authorization is terminated  or revoked sooner.       Influenza A by PCR NEGATIVE NEGATIVE Final   Influenza B by PCR NEGATIVE NEGATIVE Final    Comment: (NOTE) The Xpert Xpress SARS-CoV-2/FLU/RSV plus assay is intended as an aid in the diagnosis of influenza from Nasopharyngeal swab specimens and should not be used as a sole basis for treatment. Nasal washings and aspirates are unacceptable for Xpert Xpress SARS-CoV-2/FLU/RSV testing.  Fact Sheet for Patients: BloggerCourse.com  Fact Sheet for Healthcare Providers: SeriousBroker.it  This test is not yet approved or cleared by the [place]  FDA and has been authorized for detection and/or diagnosis of SARS-CoV-2 by FDA under an Emergency Use Authorization (EUA). This EUA will remain in effect (meaning this test can be used) for the duration of the COVID-19 declaration under Section 564(b)(1) of the Act, 21 U.S.C. section 360bbb-3(b)(1), unless the authorization is terminated or revoked.     Resp Syncytial Virus by PCR NEGATIVE NEGATIVE Final    Comment:  (NOTE) Fact Sheet for Patients: BloggerCourse.com  Fact Sheet for Healthcare Providers: SeriousBroker.it  This test is not yet approved or cleared by the [place]  FDA and has been authorized for detection and/or diagnosis of SARS-CoV-2 by FDA under an Emergency Use Authorization (EUA). This EUA will remain in effect (meaning this test can be used) for the  duration of the COVID-19 declaration under Section 564(b)(1) of the Act, 21 U.S.C. section 360bbb-3(b)(1), unless the authorization is terminated or revoked.  Performed at [facility], [address], [place], [place] [address]     Time coordinating discharge: 40 mins   SIGNED:  Afton Louder, MD  [facility] 04/13/2024, 10:42 AM How to contact the [facility] Attending or Consulting provider 7A - 7P or covering provider during after hours 7P -7A, for this patient?  Check the care team in [facility] and look for a) attending/consulting [facility] provider listed and b) the [facility] team listed Log into [email or web address] and use [place] universal password to access. If you do not have the password, please contact the hospital operator. Locate the [facility] provider you are looking for under [facility] and page to a number that you can be directly reached. If you still have difficulty reaching the provider, please page the Surgery [facility] (Director on Call) for the Hospitalists listed on amion for assistance.

## 2024-04-13 NOTE — Plan of Care (Signed)
  Problem: Acute Rehab OT Goals (only OT should resolve) Goal: Pt. Will Perform Grooming Flowsheets (Taken 04/13/2024 1217) Pt Will Perform Grooming:  with modified independence  standing Goal: Pt. Will Perform Lower Body Dressing Flowsheets (Taken 04/13/2024 1217) Pt Will Perform Lower Body Dressing: with modified independence Goal: Pt. Will Transfer To Toilet Flowsheets (Taken 04/13/2024 1217) Pt Will Transfer to Toilet:  with modified independence  ambulating Goal: Pt. Will Perform Toileting-Clothing Manipulation Flowsheets (Taken 04/13/2024 1217) Pt Will Perform Toileting - Clothing Manipulation and hygiene: with modified independence Goal: Pt/Caregiver Will Perform Home Exercise Program Flowsheets (Taken 04/13/2024 1217) Pt/caregiver will Perform Home Exercise Program:  Increased strength  Both right and left upper extremity  Independently  Trevonn Hallum OT, MOT

## 2024-04-13 NOTE — TOC Transition Note (Signed)
 Transition of Care [facility]) - Discharge Note   Patient Details  Name: Candace Harris MRN: [id number] Date of Birth: 03-06-1951  Transition of Care [facility]) CM/SW Contact:  Lucie Lunger, LCSWA Phone Number: 04/13/2024, 11:54 AM  Clinical Narrative:    CSW updated that PT is recommending HH PT for pt at D/C. CSW met with pt at bedside to review, pt states she is agreeable and does not have an agency preference. CSW spoke to [place] with [facility] who states they can accept [facility] PT/OT referral, MD placed HH orders. TOC signing off.   Final next level of care: Home w Home Health Services Barriers to Discharge: Barriers Resolved   Patient Goals and CMS Choice Patient states their goals for this hospitalization and ongoing recovery are:: return home CMS Medicare.gov Compare Post Acute Care list provided to:: Patient Choice offered to / list presented to : Patient      Discharge Placement                       Discharge Plan and Services Additional resources added to the After Visit Summary for   In-house Referral: Clinical Social Work Discharge Planning Services: CM Consult                      HH Arranged: PT, OT HH Agency: [facility] Date [facility] Agency Contacted: 04/13/24   Representative spoke with at [facility] Agency: Darleene  Social Drivers of Health (SDOH) Interventions SDOH Screenings   Food Insecurity: Unknown (04/10/2024)  Housing: Low Risk  (04/10/2024)  Transportation Needs: No Transportation Needs (04/10/2024)  Utilities: Not At Risk (04/10/2024)  Financial Resource Strain: Medium Risk (08/13/2023)   Received from [facility]  Social Connections: Moderately Isolated (04/10/2024)  Tobacco Use: High Risk (04/12/2024)  Health Literacy: Medium Risk (08/13/2023)   Received from [facility]     Readmission Risk Interventions    04/12/2024    1:34 PM  Readmission Risk Prevention Plan  Transportation Screening Complete  HRI or Home Care Consult Complete  Social  Work Consult for Recovery Care Planning/Counseling Complete  Palliative Care Screening Not Applicable  Medication Review Oceanographer) Complete

## 2024-04-13 NOTE — Evaluation (Addendum)
 Physical Therapy Evaluation Patient Details Name: Candace Harris MRN: [id number] DOB: [birth year] Today's Date: 04/13/2024  History of Present Illness  Candace Harris is a 73 year old female with chronic hypoxic respiratory failure chronically on 3 L oxygen , tobacco abuse, GAD, history of PTSD, type 2 diabetes mellitus, fibromyalgia and depression, hypertension, COPD, GERD presented to the ED by [facility] for difficulty breathing.  Patient reports that she has had a difficult time breathing more than normal over the past several days and reports she has had malaise and feeling unwell for the past week with poor appetite and significant coughing and wheezing.  Her cough has been productive of a thick greenish sputum.  EMS found her at home on 4 L nasal cannula but oxygenating at 87%.  They found her to be dyspneic and having physical exam findings of severe diffuse wheezing.  She was given a dose of IV Solu-Medrol  and route and albuterol  nebulizer treatment.  She presented with a leukocytosis, she was afebrile, her labs revealed a mild AKI and hypokalemia with a potassium of 2.6.  Chest x-ray with no acute findings reported.  ED provider reported purulent sputum production and started her on broad-spectrum antibiotic coverage for CAP.  Admission was requested for treatment of acute COPD exacerbation with acute on chronic respiratory failure.   Clinical Impression  Patient demonstrates slow labored movement for sitting up at bedside, unsteady on feet during transfers to chair, tolerated ambulating in room without loss of balance while on 2 LPM O2 with SpO2 at 100% and limited mostly due to fatigue. PLAN:  Patient to be discharged home today and discharged from acute physical therapy to care of nursing for ambulation as tolerated for length of stay with recommendations stated below          If plan is discharge home, recommend the following: A little help with walking and/or transfers;A little help with  bathing/dressing/bathroom;Help with stairs or ramp for entrance;Assistance with cooking/housework;Assist for transportation   Can travel by private vehicle        Equipment Recommendations None recommended by PT  Recommendations for Other Services       Functional Status Assessment Patient has had a recent decline in their functional status and demonstrates the ability to make significant improvements in function in a reasonable and predictable amount of time.     Precautions / Restrictions Precautions Precautions: Fall Recall of Precautions/Restrictions: Intact Restrictions Weight Bearing Restrictions Per Provider Order: No      Mobility  Bed Mobility Overal bed mobility: Needs Assistance Bed Mobility: Supine to Sit     Supine to sit: Supervision, HOB elevated     General bed mobility comments: increased time with labored movement    Transfers Overall transfer level: Needs assistance Equipment used: Rolling walker (2 wheels) Transfers: Sit to/from Stand, Bed to chair/wheelchair/BSC Sit to Stand: Min assist, Contact guard assist   Step pivot transfers: Min assist, Contact guard assist       General transfer comment: labored movement; extended time    Ambulation/Gait Ambulation/Gait assistance: Min assist Gait Distance (Feet): 20 Feet Assistive device: Rolling walker (2 wheels) Gait Pattern/deviations: Decreased step length - right, Decreased step length - left, Decreased stride length Gait velocity: decreased     General Gait Details: slow labored movement with increased time for making turns, no loww of balance, limited mostly due to fatigue, on 2 LPM O2 with SpO2 at 100%  Careers information officer  Tilt Bed    Modified Rankin (Stroke Patients Only)       Balance Overall balance assessment: Needs assistance Sitting-balance support: Feet supported, No upper extremity supported Sitting balance-Leahy Scale: Fair Sitting  balance - Comments: seated at EOB   Standing balance support: Bilateral upper extremity supported, During functional activity, Reliant on assistive device for balance Standing balance-Leahy Scale: Fair Standing balance comment: using RW                             Pertinent Vitals/Pain Pain Assessment Pain Assessment: No/denies pain    Home Living Family/patient expects to be discharged to:: Private residence Living Arrangements: Children Available Help at Discharge: Family;Personal care attendant Type of Home: Apartment Home Access: Stairs to enter Entrance Stairs-Rails: Doctor, general practice of Steps: 17   Home Layout: One level Home Equipment: Agricultural consultant (2 wheels);Hand held shower head;Rollator (4 wheels);Cane - single point;Grab bars - tub/shower Additional Comments: PCA daily for 2 to 3 hours per pt report.    Prior Function Prior Level of Function : Needs assist;History of Falls (last six months)       Physical Assist : ADLs (physical);Mobility (physical)   ADLs (physical): IADLs;Bathing;Dressing;Toileting Mobility Comments: Household ambulator with RW. ADLs Comments: Assist for bathing, dressing, toileting, and IADL's.     Extremity/Trunk Assessment   Upper Extremity Assessment Upper Extremity Assessment: Defer to OT evaluation    Lower Extremity Assessment Lower Extremity Assessment: Generalized weakness    Cervical / Trunk Assessment Cervical / Trunk Assessment: Kyphotic  Communication   Communication Communication: Impaired Factors Affecting Communication: Hearing impaired    Cognition Arousal: Alert Behavior During Therapy: WFL for tasks assessed/performed                             Following commands: Intact       Cueing Cueing Techniques: Verbal cues, Gestural cues, Tactile cues, Visual cues     General Comments      Exercises     Assessment/Plan    PT Assessment All further PT needs can be  met in the next venue of care  PT Problem List Decreased strength;Decreased activity tolerance;Decreased balance;Decreased mobility       PT Treatment Interventions      PT Goals (Current goals can be found in the Care Plan section)  Acute Rehab PT Goals Patient Stated Goal: return home with family to assist PT Goal Formulation: With patient Time For Goal Achievement: 04/13/24 Potential to Achieve Goals: Good    Frequency       Co-evaluation PT/OT/SLP Co-Evaluation/Treatment: Yes Reason for Co-Treatment: To address functional/ADL transfers PT goals addressed during session: Mobility/safety with mobility;Balance;Proper use of DME OT goals addressed during session: ADL's and self-care       AM-PAC PT 6 Clicks Mobility  Outcome Measure Help needed turning from your back to your side while in a flat bed without using bedrails?: None Help needed moving from lying on your back to sitting on the side of a flat bed without using bedrails?: A Little Help needed moving to and from a bed to a chair (including a wheelchair)?: A Little Help needed standing up from a chair using your arms (e.g., wheelchair or bedside chair)?: A Little Help needed to walk in hospital room?: A Little Help needed climbing 3-5 steps with a railing? : A Lot 6 Click Score: 18    End of  Session Equipment Utilized During Treatment: Gait belt;Oxygen  Activity Tolerance: Patient tolerated treatment well;Patient limited by fatigue Patient left: in chair;with call bell/phone within reach Nurse Communication: Mobility status PT Visit Diagnosis: Unsteadiness on feet (R26.81);Other abnormalities of gait and mobility (R26.89);Muscle weakness (generalized) (M62.81)    Time: 9048-8979 PT Time Calculation (min) (ACUTE ONLY): 29 min   Charges:   PT Evaluation $PT Eval Moderate Complexity: 1 Mod PT Treatments $Therapeutic Activity: 23-37 mins PT General Charges $$ ACUTE PT VISIT: 1 Visit         1:46 PM,  04/13/24 Lynwood Music, MPT Physical Therapist with [facility] [phone] office [phone] mobile phone

## 2024-04-15 LAB — CULTURE, BLOOD (ROUTINE X 2)
Culture: NO GROWTH
Culture: NO GROWTH
Special Requests: ADEQUATE
Special Requests: ADEQUATE

## 2024-04-27 ENCOUNTER — Telehealth: Payer: Self-pay | Admitting: *Deleted

## 2024-04-27 NOTE — Telephone Encounter (Signed)
 Please advise  Copied from CRM [id number]. Topic: Clinical - Order For Equipment >> Apr 27, 2024 11:34 AM Corean SAUNDERS wrote: Reason for CRM: Patient is requesting Dr. Darlean to please her her to find a new oxygen  supply company as [employer] will no longer accept her insurance.  Please call patient back to advise.

## 2024-04-28 NOTE — Telephone Encounter (Signed)
 Pt is requesting we place a new o2 order with a different dme ; pt having issues with [employer]. Is this okay?

## 2024-04-30 ENCOUNTER — Ambulatory Visit: Payer: Self-pay

## 2024-04-30 ENCOUNTER — Other Ambulatory Visit: Payer: Self-pay

## 2024-04-30 DIAGNOSIS — J9611 Chronic respiratory failure with hypoxia: Secondary | ICD-10-CM

## 2024-04-30 DIAGNOSIS — J449 Chronic obstructive pulmonary disease, unspecified: Secondary | ICD-10-CM

## 2024-04-30 NOTE — Telephone Encounter (Signed)
 Sent referral to dme

## 2024-04-30 NOTE — Telephone Encounter (Addendum)
 FYI Only or Action Required?: FYI only for provider.  Patient is followed in Pulmonology for COPD , last seen on 12/17/2023 by Darlean Ozell NOVAK, MD.  Called Nurse Triage reporting Appointment.   Triage Disposition: See PCP Within 2 Weeks  Patient/caregiver understands and will follow disposition?: Yes  **Post hospitalization appt scheduled for 11/14**     Copied from CRM #[id number]. Topic: Clinical - Red Word Triage >> Apr 30, 2024  2:57 PM Avram MATSU wrote: Red Word that prompted transfer to Nurse Triage: pain all over body   ----------------------------------------------------------------------- From previous Reason for Contact - Scheduling: Patient/patient representative is calling to schedule an appointment. Refer to attachments for appointment information. Reason for Disposition  Requesting regular office appointment  Answer Assessment - Initial Assessment Questions 1. ONSET: When did the  body pains start?       Ongoing, patient has arthritis in her neck, shoulder, left wrist and back   2. LOCATION: What part of your body is hurting? (e.g., entire body, arms, legs)      All over her body, primarily neck, shoulder, back, left wrist  3. SEVERITY: How bad is the pain? (Scale 1-10; or mild, moderate, severe)       4. CAUSE: What do you think is causing the pains?       5. FEVER: Do you have a fever? If Yes, ask: What is your temperature, how was it measured, and  when did it start?        6. OTHER SYMPTOMS: Do you have any other symptoms? (e.g., chest pain, cold or flu symptoms, rash, weakness, weight loss)  Answer Assessment - Initial Assessment Questions 1. REASON FOR CALL: What is the main reason for your call? or How can I best help you?    Patient calling to schedule follow up appt. With Dr. Darlean, was discharged from hospital on 04/14/2024. She was scheduled for 11/14 with Dr. Darlean, and placed on the wait list if any sooner appt becomes  available.  Protocols used: Muscle Aches and Body Pain-A-AH, Information Only Call - No Triage-A-AH

## 2024-05-03 ENCOUNTER — Telehealth: Payer: Self-pay | Admitting: Internal Medicine

## 2024-05-03 NOTE — Telephone Encounter (Signed)
 [employer] [facility] is requesting new order / new referral

## 2024-05-04 ENCOUNTER — Other Ambulatory Visit: Payer: Self-pay

## 2024-05-04 DIAGNOSIS — J449 Chronic obstructive pulmonary disease, unspecified: Secondary | ICD-10-CM

## 2024-05-04 NOTE — Telephone Encounter (Signed)
 ERROR

## 2024-05-05 NOTE — Telephone Encounter (Signed)
 Sent new order 10/21

## 2024-05-05 NOTE — Telephone Encounter (Signed)
 Atc the number on file and it states the number is not in service - for these type of symptoms pt primary dr needs to handle this bc we are the lung specialist.

## 2024-05-28 ENCOUNTER — Ambulatory Visit: Payer: Self-pay | Admitting: Internal Medicine

## 2024-05-28 ENCOUNTER — Ambulatory Visit (HOSPITAL_COMMUNITY)
Admission: RE | Admit: 2024-05-28 | Discharge: 2024-05-28 | Disposition: A | Discharge status: Home / Self Care | Source: Ambulatory Visit | Attending: Internal Medicine | Admitting: Internal Medicine

## 2024-05-28 ENCOUNTER — Ambulatory Visit (INDEPENDENT_AMBULATORY_CARE_PROVIDER_SITE_OTHER): Admitting: Internal Medicine

## 2024-05-28 ENCOUNTER — Encounter: Payer: Self-pay | Admitting: Internal Medicine

## 2024-05-28 VITALS — BP 131/80 | HR 97 | Ht 61.0 in | Wt 94.8 lb

## 2024-05-28 DIAGNOSIS — F17210 Nicotine dependence, cigarettes, uncomplicated: Secondary | ICD-10-CM

## 2024-05-28 DIAGNOSIS — J449 Chronic obstructive pulmonary disease, unspecified: Secondary | ICD-10-CM

## 2024-05-28 DIAGNOSIS — R0609 Other forms of dyspnea: Secondary | ICD-10-CM

## 2024-05-28 DIAGNOSIS — R1084 Generalized abdominal pain: Secondary | ICD-10-CM | POA: Diagnosis not present

## 2024-05-28 DIAGNOSIS — R911 Solitary pulmonary nodule: Secondary | ICD-10-CM

## 2024-05-28 DIAGNOSIS — R109 Unspecified abdominal pain: Secondary | ICD-10-CM | POA: Insufficient documentation

## 2024-05-28 DIAGNOSIS — J9611 Chronic respiratory failure with hypoxia: Secondary | ICD-10-CM

## 2024-05-28 DIAGNOSIS — R9389 Abnormal findings on diagnostic imaging of other specified body structures: Secondary | ICD-10-CM

## 2024-05-28 DIAGNOSIS — R918 Other nonspecific abnormal finding of lung field: Secondary | ICD-10-CM

## 2024-05-28 MED ORDER — ALBUTEROL SULFATE HFA 108 (90 BASE) MCG/ACT IN AERS: 2.0000 | INHALATION_SPRAY | RESPIRATORY_TRACT | 1 refills | Status: AC | PRN

## 2024-05-28 MED ORDER — BUDESONIDE-FORMOTEROL FUMARATE 160-4.5 MCG/ACT IN AERO: 2.0000 | INHALATION_SPRAY | Freq: Two times a day (BID) | RESPIRATORY_TRACT | 6 refills | Status: AC

## 2024-05-28 NOTE — Telephone Encounter (Signed)
 The emergent line called me about the CXR because I'm at [facility]. You can take over. I was just trying to respond to their call. It seems any urgent clinic call goes to [facility] ICU physician.

## 2024-05-28 NOTE — Telephone Encounter (Signed)
 FYI Only or Action Required?: Action required by provider: lab or test result follow-up needed.  Patient is followed in Pulmonology for COPD, last seen on 05/28/2024 by Darlean Ozell NOVAK, MD.  Called Nurse Triage reporting Results.  Triage Disposition: Call PCP Now  Patient/caregiver understands and will follow disposition?: Yes      Copied from CRM #[id number]. Topic: Clinical - Lab/Test Results >> May 28, 2024  1:04 PM Rilla B wrote: Reason for CRM: [facility] radiology with critical results.  No answer to [place]. NT       Reason for Disposition  Lab or radiology calling with CRITICAL test results  Answer Assessment - Initial Assessment Questions On call provider given the below results.    1. REASON FOR CALL or QUESTION: What is your reason for calling today? or How can I best     Chest x-ray results  2. CALLER: Document the source of call. (e.g., laboratory staff, caregiver or patient).     Randine with [facility] radiology    IMPRESSION: 1. A more prominent triangular opacity is identified in the central right mid lung zone. Consideration should be given toward further evaluation by CT of the chest for further characterization. 2. Underlying changes of emphysema.  Protocols used: PCP Call - No Triage-A-AH

## 2024-05-28 NOTE — Progress Notes (Signed)
 Candace Harris, female    DOB: 05/14/1951    MRN: [id number]   Brief patient profile:  54 yobf active smoker with GOLD I COPD by pfts 02/2019 transferred care to [facility] office 11/02/2020 p admit (see below)   From Dr Gretta 11/24/19 Elderly BF with a history of tobacco abuse and COPD who presents for evaluation of dyspnea on exertion.   She was diagnosed with COPD about 2 years ago but has had progressively worsening shortness of breath over the last 2 years.  She also has cough, sputum production, wheezing, but dyspnea on exertion is her main complaint.  Her activity is fairly limited; she is only able to walk a few steps around her house without stopping.   She is on 3 L supplemental oxygen  at home.  She was prescribed 2.5 L but had ongoing dyspnea on titrated up to 3 L.  Her home saturations are usually in the 90s, but sometimes drop into the 80s.  She is currently using her Trelegy inhaler daily with frequent albuterol .  She continues to smoke 0.25- 0.5 packs/day; she has smoked 0.5 ppd for the last 50 years.     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  Rec: trelegy Check echo: 12/15/19  G I diastolic dysfunction / no cor pulmonale    Stopped trelegy  mid Jan 2022 due irritated mouth   Admit date: 08/15/2020 Discharge date: 08/18/2020 Brief Hospitalization Summary: Please see all hospital notes, images, labs for full details of the hospitalization. ADMISSION HPI: Candace Harris is a 73 y.o. female with medical history significant for HTN, CAD, anxiety, COPD (on 3-6 L of O2 at home), T2DM, GERD and bipolar disorder who presents to the emergency department due to 66-month onset of shortness of breath that has been progressively worsening, she complained of decreased oral intake due to loss of taste, she also endorsed loss of smell and she states that she had diarrhea for 2 to 3 weeks last month which has resolved.  She states that she saw her PCP in [place] who prescribed prednisone   for her, but she has not yet filled the prescription.  She decided to go to the ED for further evaluation due to persistent worsening shortness of breath and concern for dehydration.   ED Course: In the emergency department, she was tachycardic and intermittently tachypneic. Work-up in the ED showed leukocytosis, thrombocytosis, hypokalemia, hyponatremia, BUN/creatinine 41/1.70 (baseline creatinine was 0.7). D-dimer 0.78. Chest x-ray was reflective of emphysema but showed no active cardiopulmonary disease Breathing treatment was provided, Solu-Medrol  125 Mg x1 was given, IV Ativan  Librium x1 due to anxiety was given and patient was provided with 500 mL of IV NS. Hospitalist was asked to admit patient for further evaluation and management.   Hospital Course    Acute on chronic respiratory failure with hypoxia - secondary to COPD exacerbation.  Pt much improved after IV steroids, bronchodilators and supportive measures. .  Prednisone  taper.   Hypomagnesemia - IV replacement given and repleted.  Hypokalemia - repleted.  Essential hypertension - continue home meds GERD - protonix  for GI protection.  Type 2 DM - monitored and stable.   AKI - resolved after hydration.    Discharge Diagnoses:  Principal Problem:   Shortness of breath Active Problems:   AKI (acute kidney injury) (HCC)   Dehydration   Leukocytosis   Thrombocytosis   Essential hypertension   Elevated d-dimer   Hypokalemia   Anxiety   COPD  with acute exacerbation (HCC)   Hyperlipidemia   Diabetes mellitus (HCC)   GERD (gastroesophageal reflux disease)   CAD (coronary artery disease)        History of Present Illness  11/02/2020  Pulmonary/ 1st office eval/ Candace Harris / [facility] GOLD I copd with component of UACS/ anxiety  Chief Complaint  Patient presents with   Follow-up    Productive cough with white phlegm since January 2022  Dyspnea:  Can do 17 steps at appt complex but has to stop at top to recover / last  shopping x 2 y prior to OV   Cough: hocking min white daytime off gerd rx  Sleep: on side flat bed s resp symptoms as long as I have on my oxygen   SABA use: avg 6-8 x per day / no maint rx  02 2lpm 24/7 rec Plan A = Automatic = Always=    Breztri  Take 2 puffs first thing in am and then another 2 puffs about 12 hours later.  Work on inhaler technique: Plan B = Backup (to supplement plan A, not to replace it) Only use your albuterol  inhaler as a rescue medication Try prilosec 40mg   Take 30-60 min before first meal of the day and Pepcid  ac (famotidine ) 20 mg one after supper  Until return  GERD diet  Please schedule a follow up office visit in 6 weeks, call sooner if needed with all medications /inhalers/ solutions in hand so we can verify exactly what you are taking. This includes all medications from all doctors and over the counters         01/13/2023  f/u ov/[place] office/[name] re: GOLD 1 copd  and anemia maint on symbicort  160/spiriva   did not  bring meds except hfa / still smoking  Chief Complaint  Patient presents with   Follow-up   Dyspnea:  same as last ov/ anemia has not been corrected but w/u in progress  Cough: varies min mucoid  Sleeping: flat bed 3 pillows  SABA use: p exercise only 02: 2.5 to 3lpm not titrating  Rec Plan A = Automatic = Always=    symbicort  160 and spiriva  2 puffs of each 1st thing in am and then another 2 puffs symbicort   12 hours later Work on inhaler technique:  Plan B = Backup (to supplement plan A, not to replace it) Only use your albuterol  inhaler as a rescue medication Plan C = Crisis (instead of Plan B but only if Plan B stops working) - only use your albuterol  nebulizer if you first try Plan B  Also  Ok to try albuterol  15 min before an activity (on alternating days between your inhaler/ nebulizer)  that you know would usually make you short of breath     Make sure you check your oxygen  saturation  AT  your highest level of activity (not after  you stop)   to be sure it stays over 90%   Please schedule a follow up office visit in 3 months call sooner if needed with all medications /inhalers/ solutions in hand    05/08/2023  f/u ov/[place] office/Fiorela Pelzer re: GOLD 1 copd/ 02 dep/ anemia  maint on symbicort  160  did not bring meds / still smoking / angioedema > ER better on steroids/ h1 and h2  Chief Complaint  Patient presents with   COPD    GOLD I   Dyspnea:  shops at [employer] / crosses parking lot ok  Cough: not much  Sleeping: flat bed on  side 3 pillows  s resp cc  SABA use: nt much  02: 2lpm hs / 2lpm daytime but 3 lpm walking > 95% Lung cancer screening: not since 12/21/21 Rec No change in pulmonary medications  My office will be contacting you by phone for referral to Dr Iva  > not done as of 12/17/2023   Please schedule a follow up visit in 6  months but call sooner if needed    Patient admitted on: 08/13/2023     Chief Complaint  Patient presents with  Shortness of Breath  Day of admission HPI: August 13, 2023   Discharge  08/20/23   Patient admitted on Home O2? - yes Patient on home anticoagulant? - no Patient admitted with Chronic home foley catheter? - No Foley catheter placed or replaced by another service prior to admission? - No  Mental Status on Admission: The patient is Alert and oriented to PERSON The patient is Alert And oriented to TIME The patient is Alert and oriented to LOCATION  This is a 73 y.o. 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, DM, GERD, FM, depression, anxiety, PTSD presents to the emergency department for evaluation of one week of cough with progressively worsening SOB. She required CPAP then BiPAP and was started on IV antibiotics, nebulizer treatments, IV fluid, electrolyte replacement. Severe dyspnea at rest.  Acute hypoxic respiratory failure 2/2 exacerbation of COPD, tobacco abuse, sepsis with organ failure, rhinovirus, dyspnea Originally admitted to ICU;  changed to overflow 08/17/23 No longer needing BIPAP due to work of breathing; on 1L saturating upper 90s IV and inhaled Steroids, O2, nebs  Escalate antibiotics from Azithro/Ceftriaxone (received 3 days) to Cefepime/Doxy day 5; WBC 25.3 today; (getting IV steroids), procalcitonin negative (0.07) Positive for rhinovirus CTA 1/29 ruled out PE; 2 view CXR repeated 2/1, showed no active disease Counseled regarding tobacco cessation, nicotine patch available if needed *Sepsis on admission as evidenced by elevated white count 18.2, tachycardia up to 113 bpm, respiratory failure requiring BiPAP, source lung Lasix 2/3 and 2/4 with good results  AT d/c:  Pt is sitting up in bed resting. Cough has improved. She is wearing 1L of oxygen . Feeling much better, feels she is ready for discharge.       12/17/2023 post hosp f/u ov/[place] office/Krissi Willaims re: GOLD 1 copd/ 02 dep/ anemia/ 02 dep  maint on symbicort160/ incruse / still smoking some Chief Complaint  Patient presents with   Shortness of Breath  Dyspnea:  room to room  Cough: rattling / beige mucus > abx already called in  Sleeping: flat bed 3 pillows s   resp cc  SABA use: use p ex 02: 3lpm LCS: per [facility] per pt  Rec We will order humidity for your oxygen   Ok to try albuterol  15 min before an activity (on alternating days)  that you know would usually make you short of breath  Work on inhaler technique:    The key is to stop smoking completely before smoking completely stops you!     05/28/2024  f/u ov/[place] office/Dow Blahnik re: GOLD 1 copd/ 02 dep/ anemia/ 02 dep maint on symbicort  / still smoking   Chief Complaint  Patient presents with   COPD    Shob - cough  Ran out of narcotics then onset aching, chills, n and v (Rourke is GI)  Dyspnea:  room to room  Cough: none  Sleeping:  flat bed/ 3 pillows wakes up uses saba hfa 2-3 nights per  week  SABA use: 02: 3 lpm  - does increase to 3.5 when housework  No obvious day to day or daytime  variability or assoc excess/ purulent sputum or mucus plugs or hemoptysis or cp or chest tightness, subjective wheeze or overt sinus or hb symptoms.    Also denies any obvious fluctuation of symptoms with weather or environmental changes or other aggravating or alleviating factors except as outlined above   No unusual exposure hx or h/o childhood pna/ asthma or knowledge of premature birth.  Current Allergies, Complete Past Medical History, Past Surgical History, Family History, and Social History were reviewed in [employer] record.  ROS  The following are not active complaints unless bolded Hoarseness, sore throat, dysphagia, dental problems, itching, sneezing,  nasal congestion or discharge of excess mucus or purulent secretions, ear ache,   fever, chills, sweats, unintended wt loss or wt gain, classically pleuritic or exertional cp,  orthopnea pnd or arm/hand swelling  or leg swelling, presyncope, palpitations, abdominal pain, anorexia, nausea, vomiting, diarrhea  or change in bowel habits or change in bladder habits, change in stools or change in urine, dysuria, hematuria,  rash, arthralgias, visual complaints, headache, numbness, weakness or ataxia or problems with walking or coordination,  change in mood or  memory.        Current Meds - - NOTE:   Unable to verify as accurately reflecting what pt takes    Medication Sig   acetaminophen  (TYLENOL ) 650 MG CR tablet Take 650 mg by mouth every 8 (eight) hours as needed for pain.   baclofen (LIORESAL) 10 MG tablet Take 10 mg by mouth 2 (two) times daily.   busPIRone (BUSPAR) 5 MG tablet Take 5 mg by mouth. TAKE ONE TABLET BY MOUTH TWICE DAILY @ 9AM & 5PM FOR 90 DAYS   clonazePAM (KLONOPIN) 0.5 MG tablet Take 0.5 mg by mouth 2 (two) times daily.   dicyclomine (BENTYL) 20 MG tablet Take 20 mg by mouth 4 (four) times daily.   famotidine  (PEPCID ) 20 MG tablet TAKE ONE TABLET BY MOUTH DAILY AFTER SUPPER   ipratropium (ATROVENT)  0.02 % nebulizer solution Inhale 0.5 mg into the lungs every 4 (four) hours as needed for wheezing.   latanoprost (XALATAN) 0.005 % ophthalmic solution Place 1 drop into both eyes at bedtime.   levalbuterol (XOPENEX) 1.25 MG/3ML nebulizer solution Inhale 1.25 mg into the lungs.   losartan (COZAAR) 50 MG tablet Take 50 mg by mouth every morning.   metFORMIN (GLUCOPHAGE) 500 MG tablet Take 500 mg by mouth 2 (two) times daily with a meal.   naloxone (NARCAN) nasal spray 4 mg/0.1 mL Place 0.4 mg into the nose once.   nitroGLYCERIN (NITROSTAT) 0.4 MG SL tablet Place 0.4 mg under the tongue every 5 (five) minutes as needed for chest pain.   omeprazole  (PRILOSEC) 40 MG capsule Take 1 capsule (40 mg total) by mouth 2 (two) times daily before a meal. TAKE 1 CAPSULE 30 TO 60 MINUTES BEFORE FIRST MEAL OF THE DAY   ondansetron (ZOFRAN) 4 MG tablet Take 4 mg by mouth daily.   oxyCODONE-acetaminophen  (PERCOCET) 7.5-325 MG tablet Take 1 tablet by mouth 4 (four) times daily as needed for moderate pain (pain score 4-6) or severe pain (pain score 7-10).   predniSONE  (DELTASONE ) 20 MG tablet Take 3 PO QAM x3days, 2 PO QAM x3days, 1 PO QAM x3days   rosuvastatin  (CRESTOR ) 20 MG tablet Take 20 mg by mouth at bedtime.   sertraline (ZOLOFT)  100 MG tablet Take 100 mg by mouth every morning.   [DISCONTINUED] albuterol  (VENTOLIN  HFA) 108 (90 Base) MCG/ACT inhaler Inhale 2 puffs into the lungs every 4 (four) hours as needed for wheezing or shortness of breath. for wheezing   [DISCONTINUED] budesonide -formoterol  (SYMBICORT ) 160-4.5 MCG/ACT inhaler INHALE 2 PUFFS INTO THE LUNGS IN THE MORNING AND AT BEDTIME            Past Medical History:  Diagnosis Date   Anxiety    Bipolar affective disorder (HCC)    CAD (coronary artery disease)    Chronic respiratory failure (HCC)    COPD (chronic obstructive pulmonary disease) (HCC)    DM (diabetes mellitus) (HCC)    GERD (gastroesophageal reflux disease)    HTN (hypertension)     Polymyalgia rheumatica (HCC)           Objective:    Wt  05/28/2024       95   12/17/2023           96  05/08/2023       96  01/13/2023           112  12/02/2022        110  05/30/2022      107  04/23/2022      104  03/25/2022        103  09/24/2021        107 03/15/2021         107  12/14/20 107 lb 12.8 oz (48.9 kg)  11/02/20 112 lb 12.8 oz (51.2 kg)  08/15/20 132 lb 4.4 oz (60 kg)      Vital signs reviewed  05/28/2024  - Note at rest 02 sats  88% on 3lpm    General appearance:    amb  (slow pace) chronically ill somber bf nad      HEENT : Oropharynx  clear/ edentulous   Nasal turbinates nl    NECK :  without  apparent JVD/ palpable Nodes/TM    LUNGS: no acc muscle use,  Mild barrel  contour chest wall with bilateral  Distant bs s audible wheeze and  without cough on insp or exp maneuvers  and mild  Hyperresonant  to  percussion bilaterally     CV:  RRR  no s3 or murmur or increase in P2, and no edema   ABD:  soft and nontender   MS:  Nl gait/ ext warm without deformities Or obvious joint restrictions  calf tenderness, cyanosis or clubbing     SKIN: warm and dry without lesions    NEURO:  alert, approp, nl sensorium with  no motor or cerebellar deficits apparent.     CXR PA and Lateral:   05/28/2024 :    I personally reviewed images and agree with radiology impression as follows:     1. A more prominent triangular opacity is identified in the central right mid lung zone. Consideration should be given toward further evaluation by CT of the chest for further characterization. 2. Underlying changes of emphysema.  Labs ordered/ reviewed:      Chemistry      Component Value Date/Time   NA 142 05/28/2024 0927   K 4.1 05/28/2024 0927   CL 100 05/28/2024 0927   CO2 24 05/28/2024 0927   BUN 4 (L) 05/28/2024 0927   CREATININE 0.65 05/28/2024 0927      Component Value Date/Time   CALCIUM  9.5 05/28/2024 0927   ALKPHOS 106 05/28/2024 0927  AST 14 05/28/2024  0927   ALT 9 05/28/2024 0927   BILITOT <0.2 05/28/2024 0927        Lab Results  Component Value Date   WBC 10.3 05/28/2024   HGB 10.3 (L) 05/28/2024   HCT 35.1 05/28/2024   MCV 88 05/28/2024   PLT 377 05/28/2024     Lab Results  Component Value Date   HGB 10.3 (L) 05/28/2024   HGB 9.3 (L) 04/12/2024   HGB 9.3 (L) 04/11/2024   HGB 10.0 (L) 04/10/2024   HGB 10.2 (L) 01/13/2023   HGB 8.3 (L) 12/10/2022     Lab Results  Component Value Date   DDIMER 0.46 05/28/2024      Lab Results  Component Value Date   TSH 2.810 05/28/2024      Troponin T   05/28/2024   18 (up to 14 nl)  Amylase      05/28/2024     71     Lab Results  Component Value Date   ESRSEDRATE 81 (H) 05/28/2024   ESRSEDRATE 55 (H) 05/22/2023        Assessment & Plan COPD mixed type (HCC) Active smoker -  PFT's  11/02/2020  FEV1 1.42 (85 % ) ratio 0.67  p 0 % improvement from saba p ? prior to study with DLCO  5.50 (31%) corrects to 1.55 (36%)  for alv volume and FV curve mild concavity   - 11/02/2020  try breztri  2bid and max gerd rx   - Allergy profile 03/15/21  >  Eos 0.1 /  IgE  72 - LDSCT  12/21/21 Centrilobular emphsyema  - 12/02/2022    >  added spiriva  2.5 x 2 puffs each am - 12/17/2023  After extensive coaching inhaler device,  effectiveness =    75% with hfa> continue breyna  160/ incruse and approp saba  She is more a FTT than having any new symptoms suggesting aecopd and I am concerned about the R hilar density on cxr so rec >>>  no change in pulmonary meds while eval the cxr with CT with contrast/ ordered    Generalized abdominal pain Assoc with n and v ? From narcotic w/d  - exam is benign and cbc ok/ amylase nl  and ESR is high but non-specific >>> referred to GI but go to ER if getting worse over the weekend    Chronic respiratory failure with hypoxia (HCC) 02 x around 2019 by Dr Vonzell -  11/02/2020   Walked RA  approx   450 ft  @ mod pace  stopped due to  [employer] /fatigue with sats still  94% - 03/15/2021   Walked on RA x  3  lap(s) =  approx 450  @ slow to mod pace, stopped due to end of study  with lowest 02 sats 94%   -  04/23/2022 Patient Saturations on Room Air at Rest = 88% Patient Saturations on 2.5LO2 of oxygen  at rest= 93% Patient Saturations on 2.5 Liters of oxygen  while Ambulating =91% lap one = 150 ft  2.5 Liters of oxygen  while Ambulating= 90% lap two = 300 ft, slow rollator pace   - 05/30/2022   Walked on 2.5 lpm   x  2  lap(s) =  approx 399  ft  @ slow pace, stopped due to tired with lowest 02 sats 94%    Again advised to titrate to keep sats > 90%    Cigarette smoker Counseled re importance of smoking cessation but did  not meet time criteria for separate billing     Each maintenance medication was reviewed in detail including emphasizing most importantly the difference between maintenance and prns and under what circumstances the prns are to be triggered using an action plan format where appropriate.  Total time for H and P, chart review, counseling, reviewing hfa/ 02 / pulse ox  device(s) and generating customized AVS unique to this office visit / same day charting = 45 min         AVS  Patient Instructions  Please remember to go to the lab department   for your tests - we will call you with the results when they are available.      Please remember to go to the  x-ray department  @  [facility] for your tests - we will call you with the results when they are available     Make sure you check your oxygen  saturation  AT  your highest level of activity (not after you stop)   to be sure it stays over 90% and adjust  02 flow upward to maintain this level if needed but remember to turn it back to previous settings when you stop (to conserve your supply).    The key is to stop smoking completely before smoking completely stops you!  We will be refer you to GI (has seen Debbie) for your nausea and vomiting   Please schedule a follow up visit in 3  months but call sooner if needed   - please bring your respiratory medications  If condition worsens while waiting for your labs please go to er       Ozell America, MD 06/03/2024

## 2024-05-28 NOTE — Telephone Encounter (Signed)
 Sounds good. Thank you

## 2024-05-28 NOTE — Telephone Encounter (Signed)
 Please advise : [facility] radiology with critical results.

## 2024-05-28 NOTE — Patient Instructions (Addendum)
 Please remember to go to the lab department   for your tests - we will call you with the results when they are available.      Please remember to go to the  x-ray department  @  [facility] for your tests - we will call you with the results when they are available     Make sure you check your oxygen  saturation  AT  your highest level of activity (not after you stop)   to be sure it stays over 90% and adjust  02 flow upward to maintain this level if needed but remember to turn it back to previous settings when you stop (to conserve your supply).    The key is to stop smoking completely before smoking completely stops you!  We will be refer you to GI (has seen Debbie) for your nausea and vomiting   Please schedule a follow up visit in 3 months but call sooner if needed   - please bring your respiratory medications  If condition worsens while waiting for your labs please go to er

## 2024-05-28 NOTE — Telephone Encounter (Signed)
 Ordered CT chest WO contrast to better evaluate triangular lesion. Suspect RML collapse. Please call patient and let her know that she needs to schedule.

## 2024-05-29 LAB — AMYLASE: Amylase: 71 U/L (ref 31–110)

## 2024-05-31 ENCOUNTER — Encounter: Payer: Self-pay | Admitting: Gastroenterology

## 2024-05-31 LAB — BASIC METABOLIC PANEL WITH GFR
BUN/Creatinine Ratio: 6 — ABNORMAL LOW (ref 12–28)
BUN: 4 mg/dL — ABNORMAL LOW (ref 8–27)
CO2: 24 mmol/L (ref 20–29)
Calcium: 9.5 mg/dL (ref 8.7–10.3)
Chloride: 100 mmol/L (ref 96–106)
Creatinine, Ser: 0.65 mg/dL (ref 0.57–1.00)
Glucose: 94 mg/dL (ref 70–99)
Potassium: 4.1 mmol/L (ref 3.5–5.2)
Sodium: 142 mmol/L (ref 134–144)
eGFR: 93 mL/min/1.73 (ref >59)

## 2024-05-31 LAB — CBC WITH DIFFERENTIAL/PLATELET
Basophils Absolute: 0 x10E3/uL (ref 0.0–0.2)
Basos: 0 %
EOS (ABSOLUTE): 0.1 x10E3/uL (ref 0.0–0.4)
Eos: 1 %
Hematocrit: 35.1 % (ref 34.0–46.6)
Hemoglobin: 10.3 g/dL — ABNORMAL LOW (ref 11.1–15.9)
Immature Grans (Abs): 0 x10E3/uL (ref 0.0–0.1)
Immature Granulocytes: 0 %
Lymphocytes Absolute: 1.6 x10E3/uL (ref 0.7–3.1)
Lymphs: 15 %
MCH: 25.8 pg — ABNORMAL LOW (ref 26.6–33.0)
MCHC: 29.3 g/dL — ABNORMAL LOW (ref 31.5–35.7)
MCV: 88 fL (ref 79–97)
Monocytes Absolute: 1.2 x10E3/uL — ABNORMAL HIGH (ref 0.1–0.9)
Monocytes: 12 %
Neutrophils Absolute: 7.4 x10E3/uL — ABNORMAL HIGH (ref 1.4–7.0)
Neutrophils: 72 %
Platelets: 377 x10E3/uL (ref 150–450)
RBC: 3.99 x10E6/uL (ref 3.77–5.28)
RDW: 17.8 % — ABNORMAL HIGH (ref 11.7–15.4)
WBC: 10.3 x10E3/uL (ref 3.4–10.8)

## 2024-05-31 LAB — HEPATIC FUNCTION PANEL
ALT: 9 IU/L (ref 0–32)
AST: 14 IU/L (ref 0–40)
Albumin: 4.3 g/dL (ref 3.8–4.8)
Alkaline Phosphatase: 106 IU/L (ref 49–135)
Bilirubin Total: <0.2 mg/dL (ref 0.0–1.2)
Bilirubin, Direct: <0.08 mg/dL (ref 0.00–0.40)
Total Protein: 7.6 g/dL (ref 6.0–8.5)

## 2024-05-31 LAB — TROPONIN T: Troponin T (Highly Sensitive): 18 ng/L (ref 0–14)

## 2024-05-31 LAB — BRAIN NATRIURETIC PEPTIDE: BNP: 73.7 pg/mL (ref 0.0–100.0)

## 2024-05-31 LAB — TSH: TSH: 2.81 u[IU]/mL (ref 0.450–4.500)

## 2024-05-31 LAB — D-DIMER, QUANTITATIVE: D-DIMER: 0.46 mg{FEU}/L (ref 0.00–0.49)

## 2024-05-31 LAB — SEDIMENTATION RATE: Sed Rate: 81 mm/h — ABNORMAL HIGH (ref 0–40)

## 2024-06-01 ENCOUNTER — Other Ambulatory Visit: Payer: Self-pay

## 2024-06-01 ENCOUNTER — Encounter: Payer: Self-pay | Admitting: Acute Care

## 2024-06-01 ENCOUNTER — Encounter: Payer: Self-pay | Admitting: Internal Medicine

## 2024-06-01 DIAGNOSIS — J449 Chronic obstructive pulmonary disease, unspecified: Secondary | ICD-10-CM

## 2024-06-01 DIAGNOSIS — R0609 Other forms of dyspnea: Secondary | ICD-10-CM

## 2024-06-01 NOTE — Telephone Encounter (Signed)
 Called to speak with patient to discuss scheduling the CT chest with contrast ordered by Dr. Darlean.  Patient's home number [phone] is not in service.  Called son, Quintin (on [place]) at [phone] --number is not in service.  I called her PCP to see if their office to see if their office had different numbers and they did not.  Will mail letter to request patient contact the office, but will keep trying to reach her via phone--in the hopes service has been restored.

## 2024-06-03 NOTE — Assessment & Plan Note (Addendum)
 Active smoker -  PFT's  11/02/2020  FEV1 1.42 (85 % ) ratio 0.67  p 0 % improvement from saba p ? prior to study with DLCO  5.50 (31%) corrects to 1.55 (36%)  for alv volume and FV curve mild concavity   - 11/02/2020  try breztri  2bid and max gerd rx   - Allergy profile 03/15/21  >  Eos 0.1 /  IgE  72 - LDSCT  12/21/21 Centrilobular emphsyema  - 12/02/2022    >  added spiriva  2.5 x 2 puffs each am - 12/17/2023  After extensive coaching inhaler device,  effectiveness =    75% with hfa> continue breyna  160/ incruse and approp saba  She is more a FTT than having any new symptoms suggesting aecopd and I am concerned about the R hilar density on cxr so rec >>>  no change in pulmonary meds while eval the cxr with CT with contrast/ ordered

## 2024-06-03 NOTE — Telephone Encounter (Signed)
 Pt does not have active/valid number in mychart nor uses mychart - Ms k has contacted pcp for get updated number on pt and they have the same one. Letter has been sent to home address regarding the CT chest getting scheduled. Waiting for reply.

## 2024-06-03 NOTE — Assessment & Plan Note (Addendum)
 02 x around 2019 by Dr Vonzell -  11/02/2020   Walked RA  approx   450 ft  @ mod pace  stopped due to  [employer] /fatigue with sats still 94% - 03/15/2021   Walked on RA x  3  lap(s) =  approx 450  @ slow to mod pace, stopped due to end of study  with lowest 02 sats 94%   -  04/23/2022 Patient Saturations on Room Air at Rest = 88% Patient Saturations on 2.5LO2 of oxygen  at rest= 93% Patient Saturations on 2.5 Liters of oxygen  while Ambulating =91% lap one = 150 ft  2.5 Liters of oxygen  while Ambulating= 90% lap two = 300 ft, slow rollator pace   - 05/30/2022   Walked on 2.5 lpm   x  2  lap(s) =  approx 399  ft  @ slow pace, stopped due to tired with lowest 02 sats 94%    Again advised to titrate to keep sats > 90%

## 2024-06-03 NOTE — Assessment & Plan Note (Addendum)
 Assoc with n and v ? From narcotic w/d  - exam is benign and cbc ok/ amylase nl  and ESR is high but non-specific >>> referred to GI but go to ER if getting worse over the weekend

## 2024-06-03 NOTE — Assessment & Plan Note (Addendum)
 Counseled re importance of smoking cessation but did not meet time criteria for separate billing     Each maintenance medication was reviewed in detail including emphasizing most importantly the difference between maintenance and prns and under what circumstances the prns are to be triggered using an action plan format where appropriate.  Total time for H and P, chart review, counseling, reviewing hfa/ 02 / pulse ox  device(s) and generating customized AVS unique to this office visit / same day charting = 45 min

## 2024-06-03 NOTE — Progress Notes (Signed)
 All pts phone numbers on file all all out of service. Letter has been sent to pt to inform her of the CT and her appt

## 2024-06-14 NOTE — Telephone Encounter (Signed)
 This is a FYI----Patient's home phone and her emergency contact's phone are not in service.  I called the patient's PCP to verify phone numbers and they have the same ones we have.  I have mailed a letter to the patient requesting she contact our office to discuss scheduling.  To date, we have not received a response.  I will keep trying to reach this patient , hoping her home phone will be restored

## 2024-06-22 ENCOUNTER — Encounter: Payer: Self-pay | Admitting: Internal Medicine

## 2024-06-23 NOTE — Telephone Encounter (Signed)
 This is a FYI---Multiple attempts have been made to reach this patient , phone calls (number is not in service) I have called the emergency contacts (number not in service, I have called the Primary Care provider and they have the same contact information).  Letter has been mailed to patient to contact our office and patient has not responded.

## 2024-07-05 ENCOUNTER — Encounter: Payer: Self-pay | Admitting: Pulmonary Disease

## 2024-08-13 ENCOUNTER — Ambulatory Visit: Admitting: Internal Medicine

## 2024-08-13 DIAGNOSIS — R0609 Other forms of dyspnea: Secondary | ICD-10-CM

## 2024-08-13 DIAGNOSIS — J449 Chronic obstructive pulmonary disease, unspecified: Secondary | ICD-10-CM

## 2024-08-13 NOTE — Progress Notes (Unsigned)
 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   She was diagnosed with COPD about 2 years ago but has had progressively worsening shortness of breath over the last 2 years.  She also has cough, sputum production, wheezing, but dyspnea on exertion is her main complaint.  Her activity is fairly limited; she is only able to walk a few steps around her house without stopping.   She is on 3 L supplemental oxygen  at home.  She was prescribed 2.5 L but had ongoing dyspnea on titrated up to 3 L.  Her home saturations are usually in the 90s, but sometimes drop into the 80s.  She is currently using her Trelegy inhaler daily with frequent albuterol .  She continues to smoke 0.25- 0.5 packs/day; she has smoked 0.5 ppd for the last 50 years.     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  Rec: trelegy Check echo: 12/15/19  G I diastolic dysfunction / no cor pulmonale    Stopped trelegy  mid Jan 2022 due irritated mouth   Admit date: 08/15/2020 Discharge date: 08/18/2020 Brief Hospitalization Summary: Please see all hospital notes, images, labs for full details of the hospitalization. ADMISSION HPI: Candace Harris is a 74 y.o. female with medical history significant for HTN, CAD, anxiety, COPD (on 3-6 L of O2 at home), T2DM, GERD and bipolar disorder who presents to the emergency department due to 20-month onset of shortness of breath that has been progressively worsening, she complained of decreased oral intake due to loss of taste, she also endorsed loss of smell and she states that she had diarrhea for 2 to 3 weeks last month which has resolved.  She states that she saw her PCP in [place] who prescribed prednisone   for her, but she has not yet filled the prescription.  She decided to go to the ED for further evaluation due to persistent worsening shortness of breath and concern for dehydration.   ED Course: In the emergency department, she was tachycardic and intermittently tachypneic. Work-up in the ED showed leukocytosis, thrombocytosis, hypokalemia, hyponatremia, BUN/creatinine 41/1.70 (baseline creatinine was 0.7). D-dimer 0.78. Chest x-ray was reflective of emphysema but showed no active cardiopulmonary disease Breathing treatment was provided, Solu-Medrol  125 Mg x1 was given, IV Ativan  Librium x1 due to anxiety was given and patient was provided with 500 mL of IV NS. Hospitalist was asked to admit patient for further evaluation and management.   Hospital Course    Acute on chronic respiratory failure with hypoxia - secondary to COPD exacerbation.  Pt much improved after IV steroids, bronchodilators and supportive measures. .  Prednisone  taper.   Hypomagnesemia - IV replacement given and repleted.  Hypokalemia - repleted.  Essential hypertension - continue home meds GERD - protonix  for GI protection.  Type 2 DM - monitored and stable.   AKI - resolved after hydration.    Discharge Diagnoses:  Principal Problem:   Shortness of breath Active Problems:   AKI (acute kidney injury) (HCC)   Dehydration   Leukocytosis   Thrombocytosis   Essential hypertension   Elevated d-dimer   Hypokalemia   Anxiety  COPD with acute exacerbation (HCC)   Hyperlipidemia   Diabetes mellitus (HCC)   GERD (gastroesophageal reflux disease)   CAD (coronary artery disease)        History of Present Illness  11/02/2020  Pulmonary/ 1st office eval/ Candace Harris / [name] [facility] GOLD I copd with component of UACS/ anxiety  Chief Complaint  Patient presents with   Follow-up    Productive cough with white phlegm since January 2022  Dyspnea:  Can do 17 steps at appt complex but has to stop at top to recover / last  shopping x 2 y prior to OV   Cough: hocking min white daytime off gerd rx  Sleep: on side flat bed s resp symptoms as long as I have on my oxygen   SABA use: avg 6-8 x per day / no maint rx  02 2lpm 24/7 rec Plan A = Automatic = Always=    Breztri  Take 2 puffs first thing in am and then another 2 puffs about 12 hours later.  Work on inhaler technique: Plan B = Backup (to supplement plan A, not to replace it) Only use your albuterol  inhaler as a rescue medication Try prilosec 40mg   Take 30-60 min before first meal of the day and Pepcid  ac (famotidine ) 20 mg one after supper  Until return  GERD diet  Please schedule a follow up office visit in 6 weeks, call sooner if needed with all medications /inhalers/ solutions in hand so we can verify exactly what you are taking. This includes all medications from all doctors and over the counters         01/13/2023  f/u ov/[place] office/Candace Harris re: GOLD 1 copd  and anemia maint on symbicort  160/spiriva   did not  bring meds except hfa / still smoking  Chief Complaint  Patient presents with   Follow-up   Dyspnea:  same as last ov/ anemia has not been corrected but w/u in progress  Cough: varies min mucoid  Sleeping: flat bed 3 pillows  SABA use: p exercise only 02: 2.5 to 3lpm not titrating  Rec Plan A = Automatic = Always=    symbicort  160 and spiriva  2 puffs of each 1st thing in am and then another 2 puffs symbicort   12 hours later Work on inhaler technique:  Plan B = Backup (to supplement plan A, not to replace it) Only use your albuterol  inhaler as a rescue medication Plan C = Crisis (instead of Plan B but only if Plan B stops working) - only use your albuterol  nebulizer if you first try Plan B  Also  Ok to try albuterol  15 min before an activity (on alternating days between your inhaler/ nebulizer)  that you know would usually make you short of breath     Make sure you check your oxygen  saturation  AT  your highest level of activity (not after  you stop)   to be sure it stays over 90%   Please schedule a follow up office visit in 3 months call sooner if needed with all medications /inhalers/ solutions in hand    05/08/2023  f/u ov/[place] office/Candace Harris re: GOLD 1 copd/ 02 dep/ anemia  maint on symbicort  160  did not bring meds / still smoking / angioedema > ER better on steroids/ h1 and h2  Chief Complaint  Patient presents with   COPD    GOLD I   Dyspnea:  shops at [employer] / crosses parking lot ok  Cough: not much  Sleeping: flat bed  on side 3 pillows  s resp cc  SABA use: nt much  02: 2lpm hs / 2lpm daytime but 3 lpm walking > 95% Lung cancer screening: not since 12/21/21 Rec No change in pulmonary medications  My office will be contacting you by phone for referral to Dr Iva  > not done as of 12/17/2023   Please schedule a follow up visit in 6  months but call sooner if needed    Patient admitted on: 08/13/2023     Chief Complaint  Patient presents with  Shortness of Breath  Day of admission HPI: August 13, 2023   Discharge  08/20/23   Patient admitted on Home O2? - yes Patient on home anticoagulant? - no Patient admitted with Chronic home foley catheter? - No Foley catheter placed or replaced by another service prior to admission? - No  Mental Status on Admission: The patient is Alert and oriented to PERSON The patient is Alert And oriented to TIME The patient is Alert and oriented to LOCATION  This is a 74 y.o. 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, DM, GERD, FM, depression, anxiety, PTSD presents to the emergency department for evaluation of one week of cough with progressively worsening SOB. She required CPAP then BiPAP and was started on IV antibiotics, nebulizer treatments, IV fluid, electrolyte replacement. Severe dyspnea at rest.  Acute hypoxic respiratory failure 2/2 exacerbation of COPD, tobacco abuse, sepsis with organ failure, rhinovirus, dyspnea Originally admitted to ICU;  changed to overflow 08/17/23 No longer needing BIPAP due to work of breathing; on 1L saturating upper 90s IV and inhaled Steroids, O2, nebs  Escalate antibiotics from Azithro/Ceftriaxone  (received 3 days) to Cefepime/Doxy day 5; WBC 25.3 today; (getting IV steroids), procalcitonin negative (0.07) Positive for rhinovirus CTA 1/29 ruled out PE; 2 view CXR repeated 2/1, showed no active disease Counseled regarding tobacco cessation, nicotine  patch available if needed *Sepsis on admission as evidenced by elevated white count 18.2, tachycardia up to 113 bpm, respiratory failure requiring BiPAP, source lung Lasix 2/3 and 2/4 with good results  AT d/c:  Pt is sitting up in bed resting. Cough has improved. She is wearing 1L of oxygen . Feeling much better, feels she is ready for discharge.       12/17/2023 post hosp f/u ov/[place] office/Candace Harris re: GOLD 1 copd/ 02 dep/ anemia/ 02 dep  maint on symbicort160/ incruse / still smoking some Chief Complaint  Patient presents with   Shortness of Breath  Dyspnea:  room to room  Cough: rattling / beige mucus > abx already called in  Sleeping: flat bed 3 pillows s   resp cc  SABA use: use p ex 02: 3lpm LCS: per [facility] per pt  Rec We will order humidity for your oxygen   Ok to try albuterol  15 min before an activity (on alternating days)  that you know would usually make you short of breath  Work on inhaler technique:    The key is to stop smoking completely before smoking completely stops you!     05/28/2024  f/u ov/[place] office/Candace Harris re: GOLD 1 copd/ 02 dep/ anemia/   maint on symbicort  / still smoking   Chief Complaint  Patient presents with   COPD    Shob - cough  Ran out of narcotics then onset aching, chills, n and v (Rourke is GI)  Dyspnea:  room to room  Cough: none  Sleeping:  flat bed/ 3 pillows wakes up uses saba hfa 2-3 nights  per week  SABA use: 02: 3 lpm  - does increase to 3.5 when housework Patient Instructions   Make sure you  check your oxygen  saturation  AT  your highest level of activity (not after you stop)   to be sure it stays over 90%  The key is to stop smoking completely before smoking completely stops you! We will be refer you to GI (has seen Debbie) for your nausea and vomiting  Please schedule a follow up visit in 3 months but call sooner if needed   - please bring your respiratory medications Cxr abn > ct chest ordered not done as of 08/13/2024    08/13/2024  f/u ov/[place] office/Candace Harris re: GOLD 1 copd/ 02 dep/ anemia/  maint on ***  No chief complaint on file.   Dyspnea:  *** Cough: *** Sleeping: ***   resp cc  SABA use: *** 02: ***  Lung cancer screening: ***   No obvious day to day or daytime variability or assoc excess/ purulent sputum or mucus plugs or hemoptysis or cp or chest tightness, subjective wheeze or overt sinus or hb symptoms.    Also denies any obvious fluctuation of symptoms with weather or environmental changes or other aggravating or alleviating factors except as outlined above   No unusual exposure hx or h/o childhood pna/ asthma or knowledge of premature birth.  Current Allergies, Complete Past Medical History, Past Surgical History, Family History, and Social History were reviewed in [employer] record.  ROS  The following are not active complaints unless bolded Hoarseness, sore throat, dysphagia, dental problems, itching, sneezing,  nasal congestion or discharge of excess mucus or purulent secretions, ear ache,   fever, chills, sweats, unintended wt loss or wt gain, classically pleuritic or exertional cp,  orthopnea pnd or arm/hand swelling  or leg swelling, presyncope, palpitations, abdominal pain, anorexia, nausea, vomiting, diarrhea  or change in bowel habits or change in bladder habits, change in stools or change in urine, dysuria, hematuria,  rash, arthralgias, visual complaints, headache, numbness, weakness or ataxia or problems with walking or  coordination,  change in mood or  memory.         Outpatient Medications Prior to Visit  Medication Sig Dispense Refill   acetaminophen  (TYLENOL ) 650 MG CR tablet Take 650 mg by mouth every 8 (eight) hours as needed for pain.     albuterol  (VENTOLIN  HFA) 108 (90 Base) MCG/ACT inhaler Inhale 2 puffs into the lungs every 4 (four) hours as needed for wheezing or shortness of breath. for wheezing 18 g 1   baclofen (LIORESAL) 10 MG tablet Take 10 mg by mouth 2 (two) times daily.     budesonide -formoterol  (SYMBICORT ) 160-4.5 MCG/ACT inhaler Inhale 2 puffs into the lungs in the morning and at bedtime. 10.2 g 6   busPIRone  (BUSPAR ) 5 MG tablet Take 5 mg by mouth. TAKE ONE TABLET BY MOUTH TWICE DAILY @ 9AM & 5PM FOR 90 DAYS     clonazePAM  (KLONOPIN ) 0.5 MG tablet Take 0.5 mg by mouth 2 (two) times daily.     dicyclomine  (BENTYL ) 20 MG tablet Take 20 mg by mouth 4 (four) times daily.     famotidine  (PEPCID ) 20 MG tablet TAKE ONE TABLET BY MOUTH DAILY AFTER SUPPER 90 tablet 3   ipratropium (ATROVENT) 0.02 % nebulizer solution Inhale 0.5 mg into the lungs every 4 (four) hours as needed for wheezing.     latanoprost  (XALATAN ) 0.005 % ophthalmic solution Place 1 drop into both  eyes at bedtime.     levalbuterol (XOPENEX) 1.25 MG/3ML nebulizer solution Inhale 1.25 mg into the lungs.     losartan (COZAAR) 50 MG tablet Take 50 mg by mouth every morning.     metFORMIN (GLUCOPHAGE) 500 MG tablet Take 500 mg by mouth 2 (two) times daily with a meal.     naloxone (NARCAN) nasal spray 4 mg/0.1 mL Place 0.4 mg into the nose once.     nitroGLYCERIN  (NITROSTAT ) 0.4 MG SL tablet Place 0.4 mg under the tongue every 5 (five) minutes as needed for chest pain.     omeprazole  (PRILOSEC) 40 MG capsule Take 1 capsule (40 mg total) by mouth 2 (two) times daily before a meal. TAKE 1 CAPSULE 30 TO 60 MINUTES BEFORE FIRST MEAL OF THE DAY 90 capsule 3   ondansetron  (ZOFRAN ) 4 MG tablet Take 4 mg by mouth daily.      oxyCODONE -acetaminophen  (PERCOCET) 7.5-325 MG tablet Take 1 tablet by mouth 4 (four) times daily as needed for moderate pain (pain score 4-6) or severe pain (pain score 7-10).     predniSONE  (DELTASONE ) 20 MG tablet Take 3 PO QAM x3days, 2 PO QAM x3days, 1 PO QAM x3days 18 tablet 0   rosuvastatin  (CRESTOR ) 20 MG tablet Take 20 mg by mouth at bedtime.     sertraline  (ZOLOFT ) 100 MG tablet Take 100 mg by mouth every morning.     No facility-administered medications prior to visit.     Past Medical History:  Diagnosis Date   Anxiety    Bipolar affective disorder (HCC)    CAD (coronary artery disease)    Chronic respiratory failure (HCC)    COPD (chronic obstructive pulmonary disease) (HCC)    DM (diabetes mellitus) (HCC)    GERD (gastroesophageal reflux disease)    HTN (hypertension)    Polymyalgia rheumatica (HCC)           Objective:    Wt  08/13/2024        ***  05/28/2024       95   12/17/2023           96  05/08/2023       96  01/13/2023           112  12/02/2022        110  05/30/2022      107  04/23/2022      104  03/25/2022        103  09/24/2021        107 03/15/2021         107  12/14/20 107 lb 12.8 oz (48.9 kg)  11/02/20 112 lb 12.8 oz (51.2 kg)  08/15/20 132 lb 4.4 oz (60 kg)      Vital signs reviewed  08/13/2024  - Note at rest 02 sats  ***% on ***   General appearance:    ***     Mild barr***   CXR PA and Lateral:   05/28/2024 :    I personally reviewed images and agree with radiology impression as follows:     1. A more prominent triangular opacity is identified in the central right mid lung zone. Consideration should be given toward further evaluation by CT of the chest for further characterization. 2. Underlying changes of emphysema.                           "

## 2024-08-30 ENCOUNTER — Ambulatory Visit: Admitting: Internal Medicine

## 2024-09-09 ENCOUNTER — Ambulatory Visit: Admitting: Gastroenterology
